# Patient Record
Sex: Female | Born: 1965 | Race: White | Hispanic: No | Marital: Married | Smoking: Never smoker
Health system: Southern US, Community
[De-identification: ages and names within clinical notes are randomized; demographics above are authoritative.]

## PROBLEM LIST (undated history)

## (undated) DIAGNOSIS — H579 Unspecified disorder of eye and adnexa: Secondary | ICD-10-CM

## (undated) DIAGNOSIS — M779 Enthesopathy, unspecified: Secondary | ICD-10-CM

## (undated) DIAGNOSIS — K635 Polyp of colon: Secondary | ICD-10-CM

## (undated) DIAGNOSIS — M75100 Unspecified rotator cuff tear or rupture of unspecified shoulder, not specified as traumatic: Secondary | ICD-10-CM

## (undated) DIAGNOSIS — S42309A Unspecified fracture of shaft of humerus, unspecified arm, initial encounter for closed fracture: Secondary | ICD-10-CM

## (undated) DIAGNOSIS — T8859XA Other complications of anesthesia, initial encounter: Secondary | ICD-10-CM

## (undated) DIAGNOSIS — M722 Plantar fascial fibromatosis: Secondary | ICD-10-CM

## (undated) DIAGNOSIS — F32A Depression, unspecified: Secondary | ICD-10-CM

## (undated) DIAGNOSIS — K317 Polyp of stomach and duodenum: Secondary | ICD-10-CM

## (undated) DIAGNOSIS — K589 Irritable bowel syndrome without diarrhea: Secondary | ICD-10-CM

## (undated) DIAGNOSIS — T7840XA Allergy, unspecified, initial encounter: Secondary | ICD-10-CM

## (undated) DIAGNOSIS — N6019 Diffuse cystic mastopathy of unspecified breast: Secondary | ICD-10-CM

## (undated) DIAGNOSIS — D279 Benign neoplasm of unspecified ovary: Secondary | ICD-10-CM

## (undated) DIAGNOSIS — M1990 Unspecified osteoarthritis, unspecified site: Secondary | ICD-10-CM

## (undated) DIAGNOSIS — F329 Major depressive disorder, single episode, unspecified: Secondary | ICD-10-CM

## (undated) HISTORY — DX: Benign neoplasm of unspecified ovary: D27.9

## (undated) HISTORY — DX: Unspecified fracture of shaft of humerus, unspecified arm, initial encounter for closed fracture: S42.309A

## (undated) HISTORY — DX: Enthesopathy, unspecified: M77.9

## (undated) HISTORY — DX: Unspecified rotator cuff tear or rupture of unspecified shoulder, not specified as traumatic: M75.100

## (undated) HISTORY — DX: Polyp of colon: K63.5

## (undated) HISTORY — PX: WISDOM TOOTH EXTRACTION: SHX21

## (undated) HISTORY — DX: Plantar fascial fibromatosis: M72.2

## (undated) HISTORY — DX: Polyp of stomach and duodenum: K31.7

## (undated) HISTORY — DX: Irritable bowel syndrome, unspecified: K58.9

## (undated) HISTORY — DX: Allergy, unspecified, initial encounter: T78.40XA

## (undated) HISTORY — DX: Depression, unspecified: F32.A

## (undated) HISTORY — DX: Unspecified disorder of eye and adnexa: H57.9

## (undated) HISTORY — DX: Diffuse cystic mastopathy of unspecified breast: N60.19

## (undated) HISTORY — DX: Major depressive disorder, single episode, unspecified: F32.9

## (undated) HISTORY — DX: Unspecified osteoarthritis, unspecified site: M19.90

---

## 1998-02-28 ENCOUNTER — Other Ambulatory Visit: Admission: RE | Admit: 1998-02-28 | Discharge: 1998-02-28 | Payer: Self-pay | Admitting: Obstetrics and Gynecology

## 1999-03-21 ENCOUNTER — Encounter: Admission: RE | Admit: 1999-03-21 | Discharge: 1999-04-14 | Payer: Self-pay | Admitting: Internal Medicine

## 1999-04-08 ENCOUNTER — Other Ambulatory Visit: Admission: RE | Admit: 1999-04-08 | Discharge: 1999-04-08 | Payer: Self-pay | Admitting: Obstetrics and Gynecology

## 1999-07-14 ENCOUNTER — Encounter: Admission: RE | Admit: 1999-07-14 | Discharge: 1999-07-14 | Payer: Self-pay | Admitting: Sports Medicine

## 1999-10-05 ENCOUNTER — Encounter: Payer: Self-pay | Admitting: Neurology

## 1999-10-05 ENCOUNTER — Emergency Department (HOSPITAL_COMMUNITY): Admission: EM | Admit: 1999-10-05 | Discharge: 1999-10-05 | Payer: Self-pay | Admitting: Emergency Medicine

## 1999-10-08 ENCOUNTER — Encounter: Admission: RE | Admit: 1999-10-08 | Discharge: 1999-10-08 | Payer: Self-pay | Admitting: Family Medicine

## 2000-02-23 ENCOUNTER — Encounter: Admission: RE | Admit: 2000-02-23 | Discharge: 2000-02-23 | Payer: Self-pay | Admitting: Sports Medicine

## 2000-04-07 ENCOUNTER — Other Ambulatory Visit: Admission: RE | Admit: 2000-04-07 | Discharge: 2000-04-07 | Payer: Self-pay | Admitting: Obstetrics and Gynecology

## 2001-04-27 ENCOUNTER — Other Ambulatory Visit: Admission: RE | Admit: 2001-04-27 | Discharge: 2001-04-27 | Payer: Self-pay | Admitting: Obstetrics and Gynecology

## 2002-06-16 ENCOUNTER — Other Ambulatory Visit: Admission: RE | Admit: 2002-06-16 | Discharge: 2002-06-16 | Payer: Self-pay | Admitting: Obstetrics and Gynecology

## 2002-11-28 ENCOUNTER — Other Ambulatory Visit: Admission: RE | Admit: 2002-11-28 | Discharge: 2002-11-28 | Payer: Self-pay | Admitting: Obstetrics and Gynecology

## 2002-12-05 ENCOUNTER — Encounter: Admission: RE | Admit: 2002-12-05 | Discharge: 2002-12-05 | Payer: Self-pay | Admitting: Sports Medicine

## 2002-12-25 ENCOUNTER — Encounter: Admission: RE | Admit: 2002-12-25 | Discharge: 2002-12-25 | Payer: Self-pay | Admitting: Obstetrics and Gynecology

## 2003-03-02 ENCOUNTER — Encounter: Admission: RE | Admit: 2003-03-02 | Discharge: 2003-04-02 | Payer: Self-pay | Admitting: Sports Medicine

## 2003-12-31 ENCOUNTER — Other Ambulatory Visit: Admission: RE | Admit: 2003-12-31 | Discharge: 2003-12-31 | Payer: Self-pay | Admitting: Obstetrics and Gynecology

## 2004-01-25 ENCOUNTER — Ambulatory Visit: Payer: Self-pay | Admitting: Internal Medicine

## 2004-06-05 ENCOUNTER — Ambulatory Visit: Payer: Self-pay | Admitting: Internal Medicine

## 2004-12-04 ENCOUNTER — Ambulatory Visit: Payer: Self-pay | Admitting: Internal Medicine

## 2005-02-09 ENCOUNTER — Other Ambulatory Visit: Admission: RE | Admit: 2005-02-09 | Discharge: 2005-02-09 | Payer: Self-pay | Admitting: Obstetrics and Gynecology

## 2005-02-13 ENCOUNTER — Encounter: Admission: RE | Admit: 2005-02-13 | Discharge: 2005-02-13 | Payer: Self-pay | Admitting: Internal Medicine

## 2005-03-10 ENCOUNTER — Ambulatory Visit: Payer: Self-pay | Admitting: Internal Medicine

## 2005-09-29 ENCOUNTER — Ambulatory Visit: Payer: Self-pay | Admitting: Internal Medicine

## 2006-02-17 ENCOUNTER — Encounter: Admission: RE | Admit: 2006-02-17 | Discharge: 2006-02-17 | Payer: Self-pay | Admitting: Internal Medicine

## 2006-03-01 ENCOUNTER — Encounter: Admission: RE | Admit: 2006-03-01 | Discharge: 2006-03-01 | Payer: Self-pay | Admitting: Internal Medicine

## 2006-03-09 ENCOUNTER — Other Ambulatory Visit: Admission: RE | Admit: 2006-03-09 | Discharge: 2006-03-09 | Payer: Self-pay | Admitting: Obstetrics and Gynecology

## 2006-06-02 ENCOUNTER — Ambulatory Visit: Payer: Self-pay | Admitting: Internal Medicine

## 2006-12-13 ENCOUNTER — Ambulatory Visit: Payer: Self-pay | Admitting: Internal Medicine

## 2006-12-13 DIAGNOSIS — J069 Acute upper respiratory infection, unspecified: Secondary | ICD-10-CM | POA: Insufficient documentation

## 2006-12-13 DIAGNOSIS — D369 Benign neoplasm, unspecified site: Secondary | ICD-10-CM | POA: Insufficient documentation

## 2006-12-20 ENCOUNTER — Telehealth (INDEPENDENT_AMBULATORY_CARE_PROVIDER_SITE_OTHER): Payer: Self-pay | Admitting: *Deleted

## 2007-03-15 ENCOUNTER — Encounter: Admission: RE | Admit: 2007-03-15 | Discharge: 2007-03-15 | Payer: Self-pay | Admitting: Obstetrics and Gynecology

## 2007-04-07 ENCOUNTER — Other Ambulatory Visit: Admission: RE | Admit: 2007-04-07 | Discharge: 2007-04-07 | Payer: Self-pay | Admitting: Obstetrics and Gynecology

## 2007-12-20 ENCOUNTER — Ambulatory Visit: Payer: Self-pay | Admitting: Sports Medicine

## 2007-12-20 DIAGNOSIS — S86819A Strain of other muscle(s) and tendon(s) at lower leg level, unspecified leg, initial encounter: Secondary | ICD-10-CM

## 2007-12-20 DIAGNOSIS — M79609 Pain in unspecified limb: Secondary | ICD-10-CM | POA: Insufficient documentation

## 2007-12-20 DIAGNOSIS — S838X9A Sprain of other specified parts of unspecified knee, initial encounter: Secondary | ICD-10-CM | POA: Insufficient documentation

## 2007-12-28 ENCOUNTER — Ambulatory Visit: Payer: Self-pay | Admitting: Sports Medicine

## 2007-12-28 DIAGNOSIS — Q667 Congenital pes cavus, unspecified foot: Secondary | ICD-10-CM | POA: Insufficient documentation

## 2008-02-02 ENCOUNTER — Ambulatory Visit: Payer: Self-pay | Admitting: Sports Medicine

## 2008-03-15 ENCOUNTER — Encounter: Admission: RE | Admit: 2008-03-15 | Discharge: 2008-03-15 | Payer: Self-pay | Admitting: Obstetrics and Gynecology

## 2008-04-10 ENCOUNTER — Ambulatory Visit: Payer: Self-pay | Admitting: Obstetrics and Gynecology

## 2008-04-10 ENCOUNTER — Other Ambulatory Visit: Admission: RE | Admit: 2008-04-10 | Discharge: 2008-04-10 | Payer: Self-pay | Admitting: Obstetrics and Gynecology

## 2008-04-10 ENCOUNTER — Encounter: Payer: Self-pay | Admitting: Obstetrics and Gynecology

## 2008-04-12 ENCOUNTER — Ambulatory Visit: Payer: Self-pay | Admitting: Obstetrics and Gynecology

## 2008-06-08 ENCOUNTER — Ambulatory Visit: Payer: Self-pay | Admitting: Sports Medicine

## 2008-06-08 DIAGNOSIS — M25569 Pain in unspecified knee: Secondary | ICD-10-CM | POA: Insufficient documentation

## 2008-06-21 ENCOUNTER — Ambulatory Visit: Payer: Self-pay | Admitting: Gynecology

## 2008-07-19 ENCOUNTER — Telehealth: Payer: Self-pay | Admitting: Family Medicine

## 2008-07-19 ENCOUNTER — Ambulatory Visit: Payer: Self-pay | Admitting: Family Medicine

## 2008-07-19 ENCOUNTER — Emergency Department (HOSPITAL_COMMUNITY): Admission: EM | Admit: 2008-07-19 | Discharge: 2008-07-20 | Payer: Self-pay | Admitting: Emergency Medicine

## 2008-07-19 DIAGNOSIS — R1013 Epigastric pain: Secondary | ICD-10-CM | POA: Insufficient documentation

## 2008-07-19 LAB — CONVERTED CEMR LAB
Bilirubin Urine: NEGATIVE
Blood in Urine, dipstick: NEGATIVE
Ketones, urine, test strip: NEGATIVE
Nitrite: NEGATIVE
Urobilinogen, UA: 0.2
pH: 7.5

## 2008-07-20 ENCOUNTER — Encounter (INDEPENDENT_AMBULATORY_CARE_PROVIDER_SITE_OTHER): Payer: Self-pay | Admitting: *Deleted

## 2008-07-24 ENCOUNTER — Telehealth: Payer: Self-pay | Admitting: Internal Medicine

## 2008-07-24 ENCOUNTER — Telehealth (INDEPENDENT_AMBULATORY_CARE_PROVIDER_SITE_OTHER): Payer: Self-pay | Admitting: *Deleted

## 2008-07-25 ENCOUNTER — Ambulatory Visit: Payer: Self-pay | Admitting: Internal Medicine

## 2008-07-25 DIAGNOSIS — R1084 Generalized abdominal pain: Secondary | ICD-10-CM | POA: Insufficient documentation

## 2008-07-25 DIAGNOSIS — R198 Other specified symptoms and signs involving the digestive system and abdomen: Secondary | ICD-10-CM | POA: Insufficient documentation

## 2008-08-07 ENCOUNTER — Ambulatory Visit: Payer: Self-pay | Admitting: Internal Medicine

## 2008-08-14 LAB — CONVERTED CEMR LAB: OCCULT 3: NEGATIVE

## 2008-08-20 ENCOUNTER — Ambulatory Visit: Payer: Self-pay | Admitting: Internal Medicine

## 2008-10-18 ENCOUNTER — Ambulatory Visit: Payer: Self-pay | Admitting: Gynecology

## 2008-10-26 ENCOUNTER — Encounter: Admission: RE | Admit: 2008-10-26 | Discharge: 2008-10-26 | Payer: Self-pay | Admitting: Gynecology

## 2009-03-21 ENCOUNTER — Encounter: Admission: RE | Admit: 2009-03-21 | Discharge: 2009-03-21 | Payer: Self-pay | Admitting: Obstetrics and Gynecology

## 2009-04-11 ENCOUNTER — Other Ambulatory Visit: Admission: RE | Admit: 2009-04-11 | Discharge: 2009-04-11 | Payer: Self-pay | Admitting: Obstetrics and Gynecology

## 2009-04-11 ENCOUNTER — Ambulatory Visit: Payer: Self-pay | Admitting: Obstetrics and Gynecology

## 2009-04-15 ENCOUNTER — Ambulatory Visit: Payer: Self-pay | Admitting: Obstetrics and Gynecology

## 2009-07-30 ENCOUNTER — Ambulatory Visit: Payer: Self-pay | Admitting: Obstetrics and Gynecology

## 2009-09-10 ENCOUNTER — Ambulatory Visit: Payer: Self-pay | Admitting: Family Medicine

## 2009-09-10 DIAGNOSIS — IMO0002 Reserved for concepts with insufficient information to code with codable children: Secondary | ICD-10-CM

## 2010-02-09 ENCOUNTER — Encounter: Payer: Self-pay | Admitting: Internal Medicine

## 2010-02-10 ENCOUNTER — Other Ambulatory Visit: Payer: Self-pay | Admitting: Obstetrics and Gynecology

## 2010-02-10 DIAGNOSIS — Z1239 Encounter for other screening for malignant neoplasm of breast: Secondary | ICD-10-CM

## 2010-02-18 NOTE — Assessment & Plan Note (Signed)
Summary: cut on rt thumb/inf/?mrsa/cjr   Vital Signs:  Patient profile:   45 year old female Height:      64 inches (162.56 cm) Weight:      132 pounds (60.00 kg) BMI:     22.74 O2 Sat:      98 % on Room air Temp:     98.2 degrees F (36.78 degrees C) oral Pulse rate:   67 / minute BP sitting:   107 / 70  (left arm) Cuff size:   regular  Vitals Entered By: Josph Macho RMA (September 10, 2009 12:17 PM)  O2 Flow:  Room air CC: Cut on right thumb- possible infection/ CF Is Patient Diabetic? No   History of Present Illness: Patient in today to have a lesion on her right thumb. She had a small scrap on it about 3 weeks ago and did not have any trouble initially. No drainage and maybe some minimal redness and tender. Now as the lesion has healed over on the top it has become increasingly hard and tender and she is afraid an infection is setting in. She denies any fevers/chills/myalgias or worsening fatigue. Her son did have a staph infection requiring antibiotics earlier in the summer.   Current Medications (verified): 1)  Ortho Tri-Cyclen Lo 0.025 Mg Tabs (Norgestimate-Ethinyl Estradiol) .... Take 1 Tablet By Mouth Once A Day 2)  Caltrate 600+d 600-400 Mg-Unit  Tabs (Calcium Carbonate-Vitamin D) .... Two Times A Day 3)  Juice Plus Fibre   Liqd (Nutritional Supplements) .... Two Times A Day  Allergies (verified): 1)  ! Penicillin V Potassium (Penicillin V Potassium) 2)  ! E.e.s. 400 (Erythromycin Ethylsuccinate) 3)  ! Amoxicillin (Amoxicillin)  Past History:  Past medical history reviewed for relevance to current acute and chronic problems. Social history (including risk factors) reviewed for relevance to current acute and chronic problems.  Past Medical History: Reviewed history from 07/25/2008 and no changes required. 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 Eda Paschal for GYN exams Depression Irritable Bowel Syndrome Urinary Tract Infection  Social History: Reviewed history  from 07/25/2008 and no changes required. Married 1 boy 1 girl Never Smoked Regular exercise-yes Occupation: Unemployed Alcohol Use - yes:1-2 on weekends Illicit Drug Use - no Daily Caffeine Use:1-2 daily  Physical Exam  General:  Well-developed,well-nourished,in no acute distress; alert,appropriate and cooperative throughout examination Neck:  No deformities, masses, or tenderness noted. Lungs:  Normal respiratory effort, chest expands symmetrically. Lungs are clear to auscultation, no crackles or wheezes. Heart:  Normal rate and regular rhythm. S1 and S2 normal without gallop, murmur, click, rub or other extra sounds. Abdomen:  Bowel sounds positive,abdomen soft and non-tender without masses, organomegaly or hernias noted. Extremities:  No clubbing, cyanosis, edema, or deformity noted  Psych:  Cognition and judgment appear intact. Alert and cooperative with normal attention span and concentration. No apparent delusions, illusions, hallucinations   Impression & Recommendations:  Problem # 1:  CELLULITIS AND ABSCESS OF UNSPECIFIED DIGIT (ICD-681.9)  Her updated medication list for this problem includes:    Bactrim Ds 800-160 Mg Tabs (Sulfamethoxazole-trimethoprim) .Marland Kitchen... 1 tab by mouth two times a day x 10 days Soak two times a day in H2O2 and warm water and report any concerning symptoms Start a probiotic such as Align daily  Complete Medication List: 1)  Ortho Tri-cyclen Lo 0.025 Mg Tabs (Norgestimate-ethinyl estradiol) .... Take 1 tablet by mouth once a day 2)  Caltrate 600+d 600-400 Mg-unit Tabs (Calcium carbonate-vitamin d) .... Two times a day  3)  Juice Plus Fibre Liqd (Nutritional supplements) .... Two times a day 4)  Bactrim Ds 800-160 Mg Tabs (Sulfamethoxazole-trimethoprim) .Marland Kitchen.. 1 tab by mouth two times a day x 10 days  Patient Instructions: 1)  Please schedule a follow-up appointment as needed .  2)  Take your antibiotic as prescribed until ALL of it is gone, but stop  if you develop a rash or swelling and contact our office as soon as possible.  3)  Soak and then debride the thumb two times a day for the next week 4)  Start a probiotic such as Align daily for at least the next month. 5)  Consider a back up method to the Ortho Tri Cyclen Lo for the next month Prescriptions: BACTRIM DS 800-160 MG TABS (SULFAMETHOXAZOLE-TRIMETHOPRIM) 1 tab by mouth two times a day x 10 days  #20 x 0   Entered and Authorized by:   Danise Edge MD   Signed by:   Danise Edge MD on 09/10/2009   Method used:   Electronically to        [facility]       [address]       [place], [place]  [address]       Ph: [phone]       Fax: [phone]   RxID:   [id number]

## 2010-03-24 ENCOUNTER — Ambulatory Visit
Admission: RE | Admit: 2010-03-24 | Discharge: 2010-03-24 | Disposition: A | Discharge status: Home / Self Care | Payer: BC Managed Care – PPO | Source: Ambulatory Visit | Attending: Obstetrics and Gynecology | Admitting: Obstetrics and Gynecology

## 2010-03-24 DIAGNOSIS — Z1239 Encounter for other screening for malignant neoplasm of breast: Secondary | ICD-10-CM

## 2010-04-17 ENCOUNTER — Other Ambulatory Visit: Payer: Self-pay | Admitting: Obstetrics and Gynecology

## 2010-04-17 ENCOUNTER — Encounter (INDEPENDENT_AMBULATORY_CARE_PROVIDER_SITE_OTHER): Payer: BC Managed Care – PPO | Admitting: Obstetrics and Gynecology

## 2010-04-17 ENCOUNTER — Other Ambulatory Visit (HOSPITAL_COMMUNITY)
Admission: RE | Admit: 2010-04-17 | Discharge: 2010-04-17 | Disposition: A | Discharge status: Home / Self Care | Payer: BC Managed Care – PPO | Source: Ambulatory Visit | Attending: Obstetrics and Gynecology | Admitting: Obstetrics and Gynecology

## 2010-04-17 DIAGNOSIS — R82998 Other abnormal findings in urine: Secondary | ICD-10-CM

## 2010-04-17 DIAGNOSIS — Z13220 Encounter for screening for lipoid disorders: Secondary | ICD-10-CM

## 2010-04-17 DIAGNOSIS — Z01419 Encounter for gynecological examination (general) (routine) without abnormal findings: Secondary | ICD-10-CM

## 2010-04-17 DIAGNOSIS — Z124 Encounter for screening for malignant neoplasm of cervix: Secondary | ICD-10-CM | POA: Insufficient documentation

## 2010-04-21 ENCOUNTER — Other Ambulatory Visit: Payer: BC Managed Care – PPO

## 2010-04-21 ENCOUNTER — Ambulatory Visit (INDEPENDENT_AMBULATORY_CARE_PROVIDER_SITE_OTHER): Payer: BC Managed Care – PPO | Admitting: Obstetrics and Gynecology

## 2010-04-21 ENCOUNTER — Other Ambulatory Visit: Payer: Self-pay | Admitting: Obstetrics and Gynecology

## 2010-04-21 DIAGNOSIS — N921 Excessive and frequent menstruation with irregular cycle: Secondary | ICD-10-CM

## 2010-04-21 DIAGNOSIS — N920 Excessive and frequent menstruation with regular cycle: Secondary | ICD-10-CM

## 2010-04-27 LAB — HEPATIC FUNCTION PANEL
ALT: 13 U/L (ref 0–35)
Alkaline Phosphatase: 42 U/L (ref 39–117)
Bilirubin, Direct: <0.1 mg/dL (ref 0.0–0.3)

## 2010-04-27 LAB — URINALYSIS, ROUTINE W REFLEX MICROSCOPIC
Bilirubin Urine: NEGATIVE
Ketones, ur: NEGATIVE mg/dL
Nitrite: NEGATIVE
Protein, ur: NEGATIVE mg/dL
Specific Gravity, Urine: 1.023 (ref 1.005–1.030)
Urobilinogen, UA: 0.2 mg/dL (ref 0.0–1.0)

## 2010-04-27 LAB — CBC
HCT: 42.3 % (ref 36.0–46.0)
MCHC: 35.4 g/dL (ref 30.0–36.0)
Platelets: 233 10*3/uL (ref 150–400)
RDW: 12.5 % (ref 11.5–15.5)
WBC: 6.7 10*3/uL (ref 4.0–10.5)

## 2010-04-27 LAB — DIFFERENTIAL
Eosinophils Absolute: 0.1 10*3/uL (ref 0.0–0.7)
Monocytes Absolute: 0.6 10*3/uL (ref 0.1–1.0)
Neutro Abs: 3.8 10*3/uL (ref 1.7–7.7)

## 2010-04-27 LAB — BASIC METABOLIC PANEL
Calcium: 9 mg/dL (ref 8.4–10.5)
Creatinine, Ser: 0.94 mg/dL (ref 0.4–1.2)
GFR calc Af Amer: >60 mL/min (ref >60)
Glucose, Bld: 114 mg/dL — ABNORMAL HIGH (ref 70–99)
Potassium: 3.6 mEq/L (ref 3.5–5.1)
Sodium: 137 mEq/L (ref 135–145)

## 2010-04-27 LAB — URINE MICROSCOPIC-ADD ON

## 2010-05-09 ENCOUNTER — Encounter: Payer: Self-pay | Admitting: Internal Medicine

## 2010-05-12 ENCOUNTER — Ambulatory Visit (INDEPENDENT_AMBULATORY_CARE_PROVIDER_SITE_OTHER): Payer: BC Managed Care – PPO | Admitting: Internal Medicine

## 2010-05-12 ENCOUNTER — Encounter: Payer: Self-pay | Admitting: Internal Medicine

## 2010-05-12 VITALS — BP 110/72 | HR 80 | Temp 97.9°F | Ht 63.5 in | Wt 133.0 lb

## 2010-05-12 DIAGNOSIS — I809 Phlebitis and thrombophlebitis of unspecified site: Secondary | ICD-10-CM

## 2010-05-12 NOTE — Progress Notes (Signed)
  Subjective:    Patient ID: Kimberly Stevens, female    DOB: 09/11/1965, 45 y.o.   MRN: [id number]  HPI  She remembers hitting countertop several weeks ago. Had no immediate sxs She now has 4 weeks of left sided abdominal pain that she notes when she turns in certain positions. She feels something a bit unusual (palpating). She describes a vertical, linear break extending across the left side of her abdomen. Somewhat tender to palpation. Duration at least 4 weeks.  Past Medical History  Diagnosis Date  . Dermoid cyst of ovary   . IBS (irritable bowel syndrome)   . Depression    No past surgical history on file.  reports that she has never smoked. She does not have any smokeless tobacco history on file. She reports that she drinks alcohol. She reports that she does not use illicit drugs. family history is negative for Cancer. Allergies  Allergen Reactions  . Amoxicillin     REACTION: tremors  . Erythromycin Ethylsuccinate     REACTION: tremors  . Penicillins     REACTION: tremors    Review of Systems      patient denies chest pain, shortness of breath, orthopnea. Denies lower extremity edema, abdominal pain, change in appetite, change in bowel movements. Patient denies rashes, musculoskeletal complaints. No other specific complaints in a complete review of systems.  Objective:   Physical Exam Very pleasant female in no acute distress. She appears fit. HEENT exam atraumatic, normocephalic, neck supple. Chest clear to auscultation cardiac exam S1-S2 are regular. Abdominal exam active bowel sounds, soft, nontender. No masses. On the left side of the abdomen is palpated there is a very superficial cord along the left side of her abdomen running vertically. Mildly tender to palpation. When the skin is stretched over the abnormality there is a linear streaking to the area.       Assessment & Plan:  Superficial phlebitis of the abdominal wall. I suspect she had a minor trauma to the  area when she hit the kitchen counter. I think she has a resulting phlebitis. I've asked her to take an aspirin daily for the next 6 weeks. This should self resolve. If his symptoms worsen or progress she is to call me and we will evaluate further.

## 2010-05-14 MED ORDER — ASPIRIN EC 325 MG PO TBEC: 325.0000 mg | DELAYED_RELEASE_TABLET | Freq: Every day | ORAL | Status: AC

## 2010-06-06 NOTE — Consult Note (Signed)
[facility]  Patient:    Kimberly Stevens, Kimberly Stevens                      MRN: [id number] Proc. Date: 10/05/99 Adm. Date:  [id number] Attending:  Lorre Nick CC:         Dr. Robert Bellow  [facility]   Consultation Report  HISTORY OF PRESENT ILLNESS:  Kimberly Stevens is a 45 year old right-handed white female born [birth year], with a history of gestational diabetes.  This patient comes into the emergency room today for an evaluation of a severe headache that began yesterday around 4 p.m.  The patient also notes chills, low-grade fever, and had a severe headache today associated with some nausea, no vomiting.  The patient went to urgent medical care for an evaluation and was found to have a urinary tract infection but also complained of some back stiffness and headache.  This patient was sent to the emergency room for an evaluation.  At this point, neurology was called for evaluation.  CT scan of the brain was done and was unremarkable without contrast.  No evidence of sinusitis was seen.  The patient does note some pain with turning her eyes from side to side.  The patient does note some neck stiffness.  Denies any confusion, blackout episodes.  Does note some slight dizziness.  The patient has had some vaginal discharge today.  PAST MEDICAL HISTORY: 1. History of gestational diabetes. 2. History of headache with urinary tract infection.  MEDICATIONS:  The patient is on no medications.  SOCIAL HISTORY:  Does not smoke.  Drinks alcohol on occasion.  The patient is married and has two children.  Works a few hours in the home environment.  The patient otherwise is not employed.  ALLERGIES:  States allergy to AMOXICILLIN and ERYTHROMYCIN.  FAMILY HISTORY:  Both parents are alive and well.  The patient has two sisters, who are alive and well.  Maternal grandmother has a history of MI. Maternal grandfather has a history of stroke.   No family history of diabetes is noted.  REVIEW OF SYSTEMS:  Notable for some probable low-grade fevers.  The patient denies any problems with vision, loss of vision, double vision.  Denies confusion, shortness of breath, chest pain, abdominal pain.  The patient denies any diarrhea, cough, sinus drainage.  Denies any problem controlling the bowels or the bladder.  PHYSICAL EXAMINATION:  VITAL SIGNS:  Blood pressure is 90/60, heart rate 80, respiratory rate 12, temperature is 99.  GENERAL:  This patient is a well-developed white female who is alert and cooperative at the time of the examination.  HEENT:  Head is atraumatic.  Eyes:  Pupils are equal, round and reactive to light.  Discs are flat bilaterally.  Good venous pulsations are seen bilaterally.  NECK:  Slightly stiff with flexion.  RESPIRATORY:  Clear.  CARDIOVASCULAR:  Regular rate and rhythm without obvious murmurs or rubs.  EXTREMITIES:  No evidence of edema in the extremities is noted.  NEUROLOGIC:  Notable for good, symmetric facies.  The patient has good pinprick and soft touch sensation bilaterally.  The patient has good strength in the facial muscles and the muscles of head turning and shoulder shrug bilaterally.  The patient has full visual fields to double simultaneous stimulation.  Speech is well-enunciated.  Motor testing reveals 5/5 strength in all fours.  Good symmetric motor tone is noted throughout.  Sensory testing is intact to pinprick and  soft touch and vibratory sensation throughout. Finger-nose-finger and toe-to-finger symmetric.  Deep tendon reflexes are symmetric and normal.  Toes are downgoing bilaterally.  The patient was not ambulated.  LABORATORY DATA:  CT scan of the brain was unremarkable.  Blood work done earlier reveals a white count of 7.5, hemoglobin 13.0, hematocrit 43.5, MCV of 98.7, platelets of 256.  Urinalysis reveals 15-16 white cells, specific gravity of 1.013, pH  8.0.  IMPRESSION: 1. Probable toxic headache versus very low-grade aseptic meningitis. 2. Urinary tract infection.  This patient has evidence of white cells in the urine, has had vaginal discharge.  Examination today does show some mild neck stiffness, but discs are flat and the patient has excellent venous pulsations in the discs, suggesting that the intracranial pressure is normal.  I think the likelihood of aseptic meningitis, therefore, is not high.  Patient claims she feels much better even over the last several hours after having gone home and eaten.  At this point, will treat the urinary tract infection and use analgesics for pain.  PLAN: 1. Will defer lumbar puncture at this point. 2. Cipro 250 mg p.o. b.i.d. for seven days. 3. Vicodin if needed for pain. 4. The patient will follow up through urgent medical care if needed.  May    contact our office if headache worsens or confusion ensues.  May proceed    with lumbar puncture if the clinical syndrome deteriorates.DD:  10/05/99 TD:  10/06/99 Job: [id number]

## 2010-07-10 ENCOUNTER — Ambulatory Visit (INDEPENDENT_AMBULATORY_CARE_PROVIDER_SITE_OTHER): Payer: BC Managed Care – PPO | Admitting: Internal Medicine

## 2010-07-10 ENCOUNTER — Encounter: Payer: Self-pay | Admitting: Internal Medicine

## 2010-07-10 VITALS — BP 100/70 | Temp 98.4°F | Wt 136.0 lb

## 2010-07-10 DIAGNOSIS — J029 Acute pharyngitis, unspecified: Secondary | ICD-10-CM

## 2010-07-10 DIAGNOSIS — J069 Acute upper respiratory infection, unspecified: Secondary | ICD-10-CM

## 2010-07-10 NOTE — Progress Notes (Signed)
  Subjective:    Patient ID: Kimberly Stevens, female    DOB: 12-06-1965, 45 y.o.   MRN: [id number]  HPI  45 year old patient who presents with a several day history of worsening sore throat. Associated symptoms include nasal congestion and some postnasal drip. There's been no strep exposure. Denies any fever. She does have some mild associated hoarseness;  she was concerned about the possibility of a strep pharyngitis;  she will be leaving on vacation in the next few days    Review of Systems  Constitutional: Negative.   HENT: Positive for congestion and postnasal drip. Negative for hearing loss, sore throat, rhinorrhea, dental problem, sinus pressure and tinnitus.   Eyes: Negative for pain, discharge and visual disturbance.  Respiratory: Negative for cough and shortness of breath.   Cardiovascular: Negative for chest pain, palpitations and leg swelling.  Gastrointestinal: Negative for nausea, vomiting, abdominal pain, diarrhea, constipation, blood in stool and abdominal distention.  Genitourinary: Negative for dysuria, urgency, frequency, hematuria, flank pain, vaginal bleeding, vaginal discharge, difficulty urinating, vaginal pain and pelvic pain.  Musculoskeletal: Negative for joint swelling, arthralgias and gait problem.  Skin: Negative for rash.  Neurological: Negative for dizziness, syncope, speech difficulty, weakness, numbness and headaches.  Hematological: Negative for adenopathy.  Psychiatric/Behavioral: Negative for behavioral problems, dysphoric mood and agitation. The patient is not nervous/anxious.        Objective:   Physical Exam  Constitutional: She is oriented to person, place, and time. She appears well-developed and well-nourished.  HENT:  Head: Normocephalic.  Right Ear: External ear normal.  Left Ear: External ear normal.       Oropharynx erythematous. No adenopathy  Eyes: Conjunctivae and EOM are normal. Pupils are equal, round, and reactive to light.  Neck:  Normal range of motion. Neck supple. No thyromegaly present.  Cardiovascular: Normal rate, regular rhythm, normal heart sounds and intact distal pulses.   Pulmonary/Chest: Effort normal and breath sounds normal.  Abdominal: Soft. Bowel sounds are normal. She exhibits no mass. There is no tenderness.  Musculoskeletal: Normal range of motion.  Lymphadenopathy:    She has no cervical adenopathy.  Neurological: She is alert and oriented to person, place, and time.  Skin: Skin is warm and dry. No rash noted.  Psychiatric: She has a normal mood and affect. Her behavior is normal.          Assessment & Plan:   URI with negative rapid strep. We'll treat symptomatically

## 2010-07-10 NOTE — Patient Instructions (Signed)
Get plenty of rest, Drink lots of  clear liquids, and use Tylenol or ibuprofen for fever and discomfort.    Call or return to clinic prn if these symptoms worsen or fail to improve as anticipated.  

## 2010-09-05 ENCOUNTER — Other Ambulatory Visit: Payer: Self-pay | Admitting: Obstetrics and Gynecology

## 2010-09-05 NOTE — Telephone Encounter (Signed)
I LEFT A MESSAGE ON PTS VOICEMAIL AT 12:27P.M. THAT DR. GOTTSEGEN NOT IN OFFICE UNTIL 09/10/10 & WAS SHE HAVING CURRENT SYMPTOMS? & IF SO LEAVE A DETAILED MESSAGE WITH THIS INFO.

## 2010-11-06 ENCOUNTER — Encounter: Payer: Self-pay | Admitting: Family Medicine

## 2010-11-06 ENCOUNTER — Ambulatory Visit (INDEPENDENT_AMBULATORY_CARE_PROVIDER_SITE_OTHER): Payer: BC Managed Care – PPO | Admitting: Family Medicine

## 2010-11-06 VITALS — BP 110/80 | Temp 98.1°F | Wt 136.0 lb

## 2010-11-06 DIAGNOSIS — R05 Cough: Secondary | ICD-10-CM

## 2010-11-06 MED ORDER — BENZONATATE 200 MG PO CAPS: 200.0000 mg | ORAL_CAPSULE | Freq: Three times a day (TID) | ORAL | Status: AC | PRN

## 2010-11-06 MED ORDER — AZITHROMYCIN 250 MG PO TABS: ORAL_TABLET | ORAL | Status: AC

## 2010-11-06 NOTE — Progress Notes (Signed)
  Subjective:    Patient ID: Kimberly Stevens, female    DOB: 12-Oct-1965, 45 y.o.   MRN: [id number]  HPI Cough for 3 weeks. Nonsmoker. No history of asthma. Increased fatigue. Still exercising some. Cough mostly dry but over the past 2 days productive of yellow sputum. Sudafed without improvement. She denies any fevers or chills. No dyspnea. No pleuritic pain. No history of GERD. No obvious postnasal drip or active allergy symptoms.  No obvious wheezing.   Review of Systems  Constitutional: Negative for chills and unexpected weight change.  HENT: Negative for sore throat, trouble swallowing, voice change and postnasal drip.   Respiratory: Positive for cough. Negative for shortness of breath and wheezing.   Neurological: Negative for headaches.       Objective:   Physical Exam  Constitutional: She appears well-developed and well-nourished. No distress.  HENT:  Right Ear: External ear normal.  Left Ear: External ear normal.  Mouth/Throat: Oropharynx is clear and moist.  Neck: Neck supple. No thyromegaly present.  Cardiovascular: Normal rate and regular rhythm.   Pulmonary/Chest: Effort normal and breath sounds normal. No respiratory distress. She has no wheezes. She has no rales.  Lymphadenopathy:    She has no cervical adenopathy.          Assessment & Plan:  Cough. Differential includes viral bronchitis versus atypical infection. She has developed productive cough past few days but nonfocal exam. Given duration, start Zithromax. Tessalon Perles 200 mg every 8 hours as needed for cough. Follow up promptly for fever or if cough still persists over the next week or 2.

## 2010-11-17 ENCOUNTER — Encounter: Payer: Self-pay | Admitting: Family Medicine

## 2010-11-17 ENCOUNTER — Ambulatory Visit (INDEPENDENT_AMBULATORY_CARE_PROVIDER_SITE_OTHER): Payer: BC Managed Care – PPO | Admitting: Family Medicine

## 2010-11-17 VITALS — BP 110/68 | Temp 98.4°F | Wt 135.0 lb

## 2010-11-17 DIAGNOSIS — R05 Cough: Secondary | ICD-10-CM

## 2010-11-17 NOTE — Progress Notes (Signed)
  Subjective:    Patient ID: Kimberly Stevens, female    DOB: 1965/07/11, 45 y.o.   MRN: [id number]  HPI  Persistent cough. Overall slightly improved. Patient concerned because she had a friend who recently died of lung cancer. Her cough is mostly nonproductive. She did have some recent productive cough which improved after Zithromax. No wheezing. Possibly some minimal postnasal drip. No obvious GERD symptoms. No fevers or chills. No appetite or weight changes. Nonsmoker. Tessalon did help with cough but she only took this for about 2 days.  She is still exercising most days of the week   Review of Systems  Constitutional: Negative for fever, chills, fatigue and unexpected weight change.  Respiratory: Positive for cough. Negative for wheezing and stridor.   Cardiovascular: Negative for chest pain, palpitations and leg swelling.  Neurological: Negative for headaches.       Objective:   Physical Exam  Constitutional: She appears well-developed and well-nourished. No distress.  HENT:  Right Ear: External ear normal.  Left Ear: External ear normal.  Mouth/Throat: Oropharynx is clear and moist.  Neck: Neck supple.  Cardiovascular: Normal rate and regular rhythm.   Pulmonary/Chest: Effort normal and breath sounds normal. No respiratory distress. She has no wheezes. She has no rales.  Lymphadenopathy:    She has no cervical adenopathy.          Assessment & Plan:  Persistent cough. Given duration chest x-ray. She has nonfocal exam and still suspect this is post viral or possibly postnasal drip related. She'll try over-the-counter antihistamine.

## 2010-11-18 ENCOUNTER — Ambulatory Visit (INDEPENDENT_AMBULATORY_CARE_PROVIDER_SITE_OTHER)
Admission: RE | Admit: 2010-11-18 | Discharge: 2010-11-18 | Disposition: A | Discharge status: Home / Self Care | Payer: BC Managed Care – PPO | Source: Ambulatory Visit | Attending: Family Medicine | Admitting: Family Medicine

## 2010-11-18 DIAGNOSIS — R05 Cough: Secondary | ICD-10-CM

## 2010-11-18 NOTE — Progress Notes (Signed)
Quick Note:  Pt informed on personally identified VM ______ 

## 2010-12-26 ENCOUNTER — Ambulatory Visit (INDEPENDENT_AMBULATORY_CARE_PROVIDER_SITE_OTHER): Payer: BC Managed Care – PPO | Admitting: Family Medicine

## 2010-12-26 ENCOUNTER — Telehealth: Payer: Self-pay | Admitting: Internal Medicine

## 2010-12-26 ENCOUNTER — Encounter: Payer: Self-pay | Admitting: Family Medicine

## 2010-12-26 VITALS — BP 100/60 | Temp 98.0°F | Wt 135.0 lb

## 2010-12-26 DIAGNOSIS — R5381 Other malaise: Secondary | ICD-10-CM

## 2010-12-26 DIAGNOSIS — R5383 Other fatigue: Secondary | ICD-10-CM

## 2010-12-26 DIAGNOSIS — J329 Chronic sinusitis, unspecified: Secondary | ICD-10-CM

## 2010-12-26 DIAGNOSIS — R059 Cough, unspecified: Secondary | ICD-10-CM

## 2010-12-26 DIAGNOSIS — R05 Cough: Secondary | ICD-10-CM

## 2010-12-26 DIAGNOSIS — J3489 Other specified disorders of nose and nasal sinuses: Secondary | ICD-10-CM

## 2010-12-26 LAB — CBC WITH DIFFERENTIAL/PLATELET
HCT: 42.8 % (ref 36.0–46.0)
Hemoglobin: 14.5 g/dL (ref 12.0–15.0)
Lymphocytes Relative: 30 % (ref 12–46)
Lymphs Abs: 2.2 10*3/uL (ref 0.7–4.0)
MCHC: 33.9 g/dL (ref 30.0–36.0)
Monocytes Absolute: 0.7 10*3/uL (ref 0.1–1.0)
Monocytes Relative: 9 % (ref 3–12)
Neutro Abs: 4.5 10*3/uL (ref 1.7–7.7)
RBC: 4.4 MIL/uL (ref 3.87–5.11)
WBC: 7.6 10*3/uL (ref 4.0–10.5)

## 2010-12-26 MED ORDER — CEFDINIR 300 MG PO CAPS: 300.0000 mg | ORAL_CAPSULE | Freq: Two times a day (BID) | ORAL | Status: AC

## 2010-12-26 NOTE — Progress Notes (Signed)
  Subjective:    Patient ID: Kimberly Stevens, female    DOB: 1965-09-28, 45 y.o.   MRN: [id number]  HPI  Patient seen with rhinorrhea over the past 2 weeks. She had some persistent cough which is finally better. Sinus congestive symptoms are new. She has occasional nausea but no vomiting. She does have some yellowish nasal discharge past several days and some intermittent facial pressure and occasional headaches. She had extreme fatigue. Intermittent sore throat. Specifically requesting mono screen though she's not had any exposures, lymphadenopathy, or fever. Occasional bilateral ear pressure.    Review of Systems  Constitutional: Positive for fatigue. Negative for fever and chills.  HENT: Positive for congestion and sinus pressure. Negative for sore throat and neck stiffness.   Respiratory: Negative for cough.   Skin: Negative for rash.  Neurological: Positive for headaches.  Hematological: Negative for adenopathy.       Objective:   Physical Exam  Constitutional: She appears well-developed and well-nourished. No distress.  HENT:  Right Ear: External ear normal.  Left Ear: External ear normal.  Mouth/Throat: Oropharynx is clear and moist.  Neck: Neck supple.  Cardiovascular: Normal rate and regular rhythm.   Pulmonary/Chest: Effort normal and breath sounds normal. No respiratory distress. She has no wheezes. She has no rales.  Lymphadenopathy:    She has no cervical adenopathy.          Assessment & Plan:  Probable acute sinusitis. Omnicef 300 mg one twice a day. Patient requesting CBC and mono screen but doubt mononucleosis clinically.

## 2010-12-26 NOTE — Telephone Encounter (Signed)
Pt called and said that she has been having sob and feels like her heart is hurting for the last 3 days. Pt is physically active and know she's not having a heart attack, but something does not feel right.

## 2010-12-26 NOTE — Telephone Encounter (Signed)
Worked in with Dr. Caryl Never

## 2010-12-27 LAB — MONONUCLEOSIS SCREEN: Mono Screen: NEGATIVE

## 2010-12-30 NOTE — Progress Notes (Signed)
Quick Note:  Pt informed ______ 

## 2011-01-07 ENCOUNTER — Encounter: Payer: Self-pay | Admitting: Family Medicine

## 2011-01-07 ENCOUNTER — Ambulatory Visit (INDEPENDENT_AMBULATORY_CARE_PROVIDER_SITE_OTHER): Payer: BC Managed Care – PPO | Admitting: Family Medicine

## 2011-01-07 VITALS — BP 98/60 | HR 89 | Temp 97.8°F | Wt 132.0 lb

## 2011-01-07 DIAGNOSIS — J019 Acute sinusitis, unspecified: Secondary | ICD-10-CM

## 2011-01-07 MED ORDER — DOXYCYCLINE HYCLATE 100 MG PO TABS: 100.0000 mg | ORAL_TABLET | Freq: Two times a day (BID) | ORAL | Status: AC

## 2011-01-07 NOTE — Progress Notes (Signed)
  Subjective:    Patient ID: Kimberly Stevens, female    DOB: 1965/05/12, 45 y.o.   MRN: [id number]  HPI 45 year old white female, nonsmoker, who in with recurrent sinus pressure, congestion, thick yellow drainage from the nose and one on for 2 days now. She saw Dr. Caryl Never about 3 weeks ago who prescribed her Omnicef. She admits to missing a few doses of the medication. She has also been taking over-the-counter nasal decongestant spray. Has had sick contacts her daughter is ill as well. She denies any lightheadedness or dizziness, chest pain, shortness of breath, nausea, vomiting, or edema.    Review of Systems As stated above Past Medical History  Diagnosis Date  . Dermoid cyst of ovary   . IBS (irritable bowel syndrome)   . Depression     History   Social History  . Marital Status: Married    Spouse Name: N/A    Number of Children: N/A  . Years of Education: N/A   Occupational History  . Not on file.   Social History Main Topics  . Smoking status: Never Smoker   . Smokeless tobacco: Not on file  . Alcohol Use: Yes  . Drug Use: No  . Sexually Active:    Other Topics Concern  . Not on file   Social History Narrative  . No narrative on file    No past surgical history on file.  Family History  Problem Relation Age of Onset  . Cancer Neg Hx     colon    Allergies  Allergen Reactions  . Amoxicillin     REACTION: tremors  . Erythromycin Ethylsuccinate     REACTION: tremors  . Penicillins     REACTION: tremors    Current Outpatient Prescriptions on File Prior to Visit  Medication Sig Dispense Refill  . Multiple Vitamins-Minerals (MULTIVITAMIN WITH MINERALS) tablet Take 1 tablet by mouth daily.        . Nutritional Supplements (JUICE PLUS FIBRE) LIQD Take by mouth 2 (two) times daily.          BP 98/60  Pulse 89  Temp(Src) 97.8 F (36.6 C) (Oral)  Wt 132 lb (59.875 kg)chart   ENT: Objective:   Physical Exam Constitutional: Alert and oriented in no  acute distress ENT: Ears bilaterally are clear sinus tenderness to palpation of the maxillary and frontal sinuses. Pharynx normal with normal dentition. No lymphadenopathy. Lungs: Clear to auscultation Cardiac: Regular rate and rhythm Skin: Warm and dry, no cyanosis       Assessment & Plan:  Assessment: Acute sinusitis-unresolved  Plan: Doxycycline 100 mg twice a day for 10 days. Encouraged over-the-counter antihistamine decongestant as directed. Patient to call if symptoms worsen or persist recheck as scheduled and when necessary

## 2011-01-07 NOTE — Patient Instructions (Signed)

## 2011-01-26 ENCOUNTER — Encounter: Payer: Self-pay | Admitting: Gynecology

## 2011-02-02 ENCOUNTER — Ambulatory Visit (INDEPENDENT_AMBULATORY_CARE_PROVIDER_SITE_OTHER): Payer: BC Managed Care – PPO | Admitting: Obstetrics and Gynecology

## 2011-02-02 DIAGNOSIS — N938 Other specified abnormal uterine and vaginal bleeding: Secondary | ICD-10-CM

## 2011-02-02 DIAGNOSIS — N949 Unspecified condition associated with female genital organs and menstrual cycle: Secondary | ICD-10-CM

## 2011-02-02 NOTE — Progress Notes (Signed)
Patient came back today because of persistent cycle problems. She can bleed as long as 14 days. She sometimes will have menorrhagia. She's had a normal SIH with endometrial biopsy last year in our office. It has become intolerable.  Pelvic exam:Kim Gardner present. Pelvic exam: External: Within normal limits. BUS within normal limits. Vaginal exam: Within normal limits. Cervix: Clean. Uterus: Within normal limits. Adnexa: Within normal limits. Rectovaginal exam: Within normal limits.   Assessment: Prolonged cycles.  Plan: We checked her TSH. We had a long discussion about Mirena IUD or endometrial ablation. Pamphlets given. Discussed vaginal hysterectomy as a last resort. Patient will inform.

## 2011-02-27 ENCOUNTER — Encounter (HOSPITAL_COMMUNITY): Payer: Self-pay | Admitting: *Deleted

## 2011-02-27 ENCOUNTER — Emergency Department (HOSPITAL_COMMUNITY)
Admission: EM | Admit: 2011-02-27 | Discharge: 2011-02-27 | Disposition: A | Discharge status: Home / Self Care | Payer: BC Managed Care – PPO | Source: Home / Self Care

## 2011-02-27 ENCOUNTER — Other Ambulatory Visit: Payer: Self-pay

## 2011-02-27 DIAGNOSIS — R0789 Other chest pain: Secondary | ICD-10-CM

## 2011-02-27 DIAGNOSIS — K3189 Other diseases of stomach and duodenum: Secondary | ICD-10-CM

## 2011-02-27 DIAGNOSIS — J020 Streptococcal pharyngitis: Secondary | ICD-10-CM

## 2011-02-27 DIAGNOSIS — K30 Functional dyspepsia: Secondary | ICD-10-CM

## 2011-02-27 LAB — POCT RAPID STREP A: Streptococcus, Group A Screen (Direct): POSITIVE — AB

## 2011-02-27 MED ORDER — OMEPRAZOLE 20 MG PO CPDR: 20.0000 mg | DELAYED_RELEASE_CAPSULE | Freq: Every day | ORAL | Status: DC

## 2011-02-27 MED ORDER — CEPHALEXIN 500 MG PO CAPS: 500.0000 mg | ORAL_CAPSULE | Freq: Three times a day (TID) | ORAL | Status: AC

## 2011-02-27 NOTE — ED Provider Notes (Signed)
History     CSN: [id number]  Arrival date & time 02/27/11  1616   None     Chief Complaint  Patient presents with  . Sore Throat    (Consider location/radiation/quality/duration/timing/severity/associated sxs/prior treatment) HPI Comments: Patient states she developed a sore throat yesterday. Her daughter recently was diagnosed with strep throat. She has had aching intermittent chest discomfort since fall 2012. She was seen by her PCP regarding this and had a chest xray done. At the time she had some sinus congestion and was treated with abx. She states the chest discomfort is not associated with exercise. She competes in triathelons, and works out regularly swimming, biking and running.  The pain does not radiate. She admits to intermittent indigestion, which she takes otc antacids for. She is concerned that the discomfort she has is due to her heart. "I know it may be anxiety." "We are in the middle of building a house."   Past Medical History  Diagnosis Date  . Dermoid cyst of ovary   . IBS (irritable bowel syndrome)   . Depression     History reviewed. No pertinent past surgical history.  Family History  Problem Relation Age of Onset  . Cancer Neg Hx     colon    History  Substance Use Topics  . Smoking status: Never Smoker   . Smokeless tobacco: Not on file  . Alcohol Use: Yes    OB History    Grav Para Term Preterm Abortions TAB SAB Ect Mult Living   2 2 2       2       Review of Systems  Constitutional: Positive for fatigue. Negative for fever and chills.  HENT: Positive for sore throat. Negative for ear pain, rhinorrhea, postnasal drip and sinus pressure.   Respiratory: Positive for chest tightness. Negative for cough, shortness of breath and wheezing.   Cardiovascular: Negative for chest pain and palpitations.  Gastrointestinal: Negative for nausea, vomiting and abdominal pain.    Allergies  Amoxicillin; Erythromycin ethylsuccinate; and  Penicillins  Home Medications   Current Outpatient Rx  Name Route Sig Dispense Refill  . CALCIUM + D PO Oral Take by mouth.      . CEPHALEXIN 500 MG PO CAPS Oral Take 1 capsule (500 mg total) by mouth 3 (three) times daily. 30 capsule 0  . MULTI-VITAMIN/MINERALS PO TABS Oral Take 1 tablet by mouth daily.      Marland Kitchen JUICE PLUS FIBRE PO LIQD Oral Take by mouth 2 (two) times daily.      Marland Kitchen OMEPRAZOLE 20 MG PO CPDR Oral Take 1 capsule (20 mg total) by mouth daily. 15 capsule 0    BP 136/86  Pulse 74  Temp(Src) 99.1 F (37.3 C) (Oral)  Resp 12  SpO2 100%  LMP 02/27/2011  Physical Exam  Nursing note and vitals reviewed. Constitutional: She appears well-developed and well-nourished. No distress.  HENT:  Head: Normocephalic and atraumatic.  Right Ear: Tympanic membrane, external ear and ear canal normal.  Left Ear: Tympanic membrane, external ear and ear canal normal.  Nose: Nose normal.  Mouth/Throat: Uvula is midline and mucous membranes are normal. Posterior oropharyngeal erythema present. No oropharyngeal exudate or posterior oropharyngeal edema.  Neck: Neck supple.  Cardiovascular: Normal rate, regular rhythm and normal heart sounds.   Pulmonary/Chest: Effort normal and breath sounds normal. No respiratory distress. She exhibits tenderness (mild TTP Lt sternal border. ).  Lymphadenopathy:    She has no cervical adenopathy.  Neurological: She  is alert.  Skin: Skin is warm and dry.  Psychiatric: She has a normal mood and affect.    ED Course  Procedures (including critical care time)  Labs Reviewed  POCT RAPID STREP A ([facility] CARE ONLY) - Abnormal; Notable for the following:    Streptococcus, Group A Screen (Direct) POSITIVE (*)    All other components within normal limits   No results found.   1. Strep pharyngitis   2. Atypical chest pain   3. Indigestion       MDM  11-17-10 CXR neg. EKG NSR, rate 62. Intermittent atypical chest pain since fall 2012. Pt has mild  chest wall pain Lt sternal border. Also has intermittent reflux symptoms. Discussed with pt will do 2 wk trial of PPI. To f/u with PCP.          Melody Comas, [place] 02/27/11 [phone]

## 2011-02-27 NOTE — ED Notes (Signed)
Pt    C/o  sorethroat      Chest  Congestion     Sinus  Drainage  As  Well  As     Chest  Pains  r  Side      -SHE  REPORTS  SHE  HAS  HAD  LINGERING  SYMPTOMS   SINCE   eND  OF  July          SHE REPORTS   FATIGUE   AS  WELL  AS   SENSATION OF  BUILD  UP  OF  PHLEGM        -  SHE  IS  ALERT  ORIENTED  APPEARS  IN NO  SEVRE  DISTRESS  SPEAKING IN  COMPLETE  SENTANCES

## 2011-02-28 NOTE — ED Provider Notes (Signed)
Medical screening examination/treatment/procedure(s) were performed by non-physician practitioner and as supervising physician I was immediately available for consultation/collaboration.   [facility]; MD   Sharin Grave, MD 02/28/11 [phone]

## 2011-04-06 ENCOUNTER — Other Ambulatory Visit: Payer: Self-pay | Admitting: Obstetrics and Gynecology

## 2011-04-06 DIAGNOSIS — Z1231 Encounter for screening mammogram for malignant neoplasm of breast: Secondary | ICD-10-CM

## 2011-04-21 ENCOUNTER — Encounter: Payer: BC Managed Care – PPO | Admitting: Obstetrics and Gynecology

## 2011-05-04 ENCOUNTER — Encounter: Payer: BC Managed Care – PPO | Admitting: Obstetrics and Gynecology

## 2011-05-05 ENCOUNTER — Other Ambulatory Visit (HOSPITAL_COMMUNITY)
Admission: RE | Admit: 2011-05-05 | Discharge: 2011-05-05 | Disposition: A | Discharge status: Home / Self Care | Payer: BC Managed Care – PPO | Source: Ambulatory Visit | Attending: Obstetrics and Gynecology | Admitting: Obstetrics and Gynecology

## 2011-05-05 ENCOUNTER — Encounter: Payer: Self-pay | Admitting: Obstetrics and Gynecology

## 2011-05-05 ENCOUNTER — Ambulatory Visit (INDEPENDENT_AMBULATORY_CARE_PROVIDER_SITE_OTHER): Payer: BC Managed Care – PPO | Admitting: Obstetrics and Gynecology

## 2011-05-05 VITALS — BP 110/64 | Ht 63.0 in | Wt 134.0 lb

## 2011-05-05 DIAGNOSIS — Z01419 Encounter for gynecological examination (general) (routine) without abnormal findings: Secondary | ICD-10-CM

## 2011-05-05 LAB — CBC WITH DIFFERENTIAL/PLATELET
Basophils Absolute: 0.1 10*3/uL (ref 0.0–0.1)
Eosinophils Absolute: 0.2 10*3/uL (ref 0.0–0.7)
Eosinophils Relative: 2 % (ref 0–5)
HCT: 43.2 % (ref 36.0–46.0)
Lymphs Abs: 1.7 10*3/uL (ref 0.7–4.0)
MCH: 32.9 pg (ref 26.0–34.0)
MCV: 99.5 fL (ref 78.0–100.0)
Monocytes Absolute: 0.4 10*3/uL (ref 0.1–1.0)
Platelets: 238 10*3/uL (ref 150–400)
RDW: 12.4 % (ref 11.5–15.5)

## 2011-05-05 NOTE — Progress Notes (Signed)
Patient came to see me today for her annual GYN exam. We have seen her in January with dysfunctional uterine bleeding. Her cycles are now returned to normal and are very tolerable. She contracepts by  vasectomy. She is having no pelvic pain or dyspareunia. She is having her mammogram tomorrow.  Physical examination: Kimberly Stevens present. HEENT within normal limits. Neck: Thyroid not large. No masses. Supraclavicular nodes: not enlarged. Breasts: Examined in both sitting and lying  position. No skin changes and no masses. Abdomen: Soft no guarding rebound or masses or hernia. Pelvic: External: Within normal limits. BUS: Within normal limits. Vaginal:within normal limits. Good estrogen effect. No evidence of cystocele rectocele or enterocele. Cervix: clean. Uterus: Normal size and shape. Adnexa: No masses. Rectovaginal exam: Confirmatory and negative. Extremities: Within normal limits.  Assessment: Normal GYN exam  Plan: Mammogram

## 2011-05-06 LAB — URINALYSIS W MICROSCOPIC + REFLEX CULTURE
Bilirubin Urine: NEGATIVE
Crystals: NONE SEEN
Glucose, UA: NEGATIVE mg/dL
Leukocytes, UA: NEGATIVE
Specific Gravity, Urine: 1.014 (ref 1.005–1.030)
Squamous Epithelial / LPF: NONE SEEN
pH: 6.5 (ref 5.0–8.0)

## 2011-05-07 ENCOUNTER — Ambulatory Visit
Admission: RE | Admit: 2011-05-07 | Discharge: 2011-05-07 | Disposition: A | Discharge status: Home / Self Care | Payer: BC Managed Care – PPO | Source: Ambulatory Visit | Attending: Obstetrics and Gynecology | Admitting: Obstetrics and Gynecology

## 2011-05-07 ENCOUNTER — Other Ambulatory Visit: Payer: Self-pay | Admitting: *Deleted

## 2011-05-07 DIAGNOSIS — Z1231 Encounter for screening mammogram for malignant neoplasm of breast: Secondary | ICD-10-CM

## 2011-05-07 MED ORDER — FLUCONAZOLE 150 MG PO TABS: 150.0000 mg | ORAL_TABLET | Freq: Every day | ORAL | Status: AC

## 2011-09-29 ENCOUNTER — Ambulatory Visit (INDEPENDENT_AMBULATORY_CARE_PROVIDER_SITE_OTHER): Payer: BC Managed Care – PPO | Admitting: Sports Medicine

## 2011-09-29 VITALS — BP 120/76 | Ht 63.0 in | Wt 132.0 lb

## 2011-09-29 DIAGNOSIS — S838X9A Sprain of other specified parts of unspecified knee, initial encounter: Secondary | ICD-10-CM

## 2011-09-29 NOTE — Progress Notes (Signed)
  Subjective:    Patient ID: Kimberly Stevens, female    DOB: 1965/12/22, 46 y.o.   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

## 2011-09-29 NOTE — Assessment & Plan Note (Signed)
This injury is to the left hamstring  Padded heel lifts to both of her orthotics Hamstring compression sleeve  Rehabilitation protocol with standard exercises and askling protocol  Okay to run slowly with the above changes as her gait did not show a limp while in the office  Recheck in 4-6 weeks

## 2011-09-29 NOTE — Patient Instructions (Signed)
Hamstring rehab  Hamstring curls with ankle weights 3 lbs. 1 x 15 reps. Hamstring swing 1 x 15. May use ankle weights Running lunge 1 x 15. Lying leg extension stretch 5 x 5 breaths Diver exercise 5 x 5 sec Glider 5 reps  Hip Abductor Exercises - Lateral leg lifts 3 x 15  Wear compression sleeve during exercise and for 1 hr after exercise  Followup 4-6 weeks.

## 2011-10-05 ENCOUNTER — Encounter: Payer: Self-pay | Admitting: Family Medicine

## 2011-10-05 ENCOUNTER — Ambulatory Visit (INDEPENDENT_AMBULATORY_CARE_PROVIDER_SITE_OTHER): Payer: BC Managed Care – PPO | Admitting: Family Medicine

## 2011-10-05 VITALS — BP 98/70 | Temp 98.0°F | Wt 134.0 lb

## 2011-10-05 DIAGNOSIS — M79672 Pain in left foot: Secondary | ICD-10-CM

## 2011-10-05 DIAGNOSIS — M79609 Pain in unspecified limb: Secondary | ICD-10-CM

## 2011-10-05 MED ORDER — DICLOFENAC SODIUM 1 % TD GEL: 2.0000 g | Freq: Four times a day (QID) | TRANSDERMAL | Status: DC

## 2011-10-05 NOTE — Progress Notes (Signed)
  Subjective:    Patient ID: Kimberly Stevens, female    DOB: Jan 28, 1965, 46 y.o.   MRN: [id number]  HPI  Patient seen with left great toe pain MTP joint. Started a few weeks ago. She is in the process of training for a half marathon. Not able to run much past few weeks secondary to toe soreness.  She was concerned about possible gout though she's not had any signs of inflammation such as redness or warmth or any major edema. No personal or family history of gout. No injury. She has orthotics. Had been running about 3-4 days per week-but relatively low mileage. She alternates with swimming and cycling. She has not taken any anti-inflammatory medications. No recent change of shoe wear.    Review of Systems  Constitutional: Negative for fever and chills.  Musculoskeletal: Negative for gait problem.  Skin: Negative for rash.       Objective:   Physical Exam  Constitutional: She appears well-developed and well-nourished.  Cardiovascular: Normal rate and regular rhythm.   Pulmonary/Chest: Effort normal and breath sounds normal. No respiratory distress. She has no wheezes. She has no rales.  Musculoskeletal:       Left foot reveals no erythema. No warmth. She has some slight hypertrophic changes left metatarsophalangeal joint. No localized tenderness. No metatarsal tenderness          Assessment & Plan:  Left foot pain. Mostly metatarsophalangeal joint. No evidence for gout. 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 She'll try diclofenac gel. She has scheduled followup with sports medicine in one month and discuss her orthotics and current shoe wear that time

## 2011-10-06 ENCOUNTER — Ambulatory Visit (INDEPENDENT_AMBULATORY_CARE_PROVIDER_SITE_OTHER): Payer: BC Managed Care – PPO | Admitting: Sports Medicine

## 2011-10-06 VITALS — BP 100/60 | Ht 63.0 in | Wt 134.0 lb

## 2011-10-06 DIAGNOSIS — M19079 Primary osteoarthritis, unspecified ankle and foot: Secondary | ICD-10-CM | POA: Insufficient documentation

## 2011-10-06 NOTE — Progress Notes (Signed)
  Subjective:    Patient ID: Kimberly Stevens, female    DOB: Apr 24, 1965, 46 y.o.   MRN: [id number]  HPI  Pt presents to clinic for evaluation of lt great toe pain worse x3 days after running 4 miles.  Became very sensitive even to touch of bed linens that night.   Hard to walk Saturday night due to pain. Bending toe is still painful dorsally. Saw Dr. Caryl Never who r/o gout, thinks she has arthritis. Comes for evaluation and possible ultrasound to see if we can determine the injury   Review of Systems     Objective:   Physical Exam No acute distress   Rt great toe normal motion Lt great toe 25 deg flexion, 30 deg extension  Tenderness at insertion of lt PF  PF surface nodule at 2nd MTP on lt There is some apparent degenerative change of the left first MTP joint No swelling or redness at this time  Ultrasound This shows a spur over the dorsum of the left first MTP joint There is a pocket of swelling on the proximal side of the joint that is surrounding the spur There is a smaller spur on the distal first MTP joint with slight soft tissue swelling surrounding this The joint itself has loss of joint space particularly when compared to the right first MTP joint  There is no effusion or crystalline change within the fluid to suggest gout      Assessment & Plan:

## 2011-10-06 NOTE — Assessment & Plan Note (Signed)
As Dr. Caryl Never advised her this does appear to be an arthritic change of the great toe  He is full term in jail he prescribed  We gave her a toe spacer and buddy taping to use during running We'll also placed a first ray post on her orthotics to give her some additional cushion and push off  She was able to run with a fairly normal motion and no real pain when that was accomplished  I will check on the status of this when I recheck her hamstring injury in 5 weeks

## 2011-10-06 NOTE — Patient Instructions (Addendum)
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

## 2011-11-03 ENCOUNTER — Encounter: Payer: BC Managed Care – PPO | Admitting: Sports Medicine

## 2011-11-19 ENCOUNTER — Ambulatory Visit (INDEPENDENT_AMBULATORY_CARE_PROVIDER_SITE_OTHER): Payer: BC Managed Care – PPO | Admitting: Sports Medicine

## 2011-11-19 VITALS — BP 100/60 | Ht 63.0 in | Wt 134.0 lb

## 2011-11-19 DIAGNOSIS — S838X9A Sprain of other specified parts of unspecified knee, initial encounter: Secondary | ICD-10-CM

## 2011-11-19 DIAGNOSIS — M79609 Pain in unspecified limb: Secondary | ICD-10-CM

## 2011-11-19 DIAGNOSIS — S86819A Strain of other muscle(s) and tendon(s) at lower leg level, unspecified leg, initial encounter: Secondary | ICD-10-CM

## 2011-11-19 DIAGNOSIS — M19079 Primary osteoarthritis, unspecified ankle and foot: Secondary | ICD-10-CM

## 2011-11-19 NOTE — Assessment & Plan Note (Signed)
Much improved Keep up home exercise program

## 2011-11-19 NOTE — Assessment & Plan Note (Signed)
Patient was fitted for a : standard, cushioned, semi-rigid orthotic. The orthotic was heated and afterward the patient stood on the orthotic blank positioned on the orthotic stand. The patient was positioned in subtalar neutral position and 10 degrees of ankle dorsiflexion in a weight bearing stance. After completion of molding, a stable base was applied to the orthotic blank. The blank was ground to a stable position for weight bearing. Size: 7 red EVA Base:blue EVA Posting: first ray distal Additional orthotic padding: neuroma pad  35 mins prep time  She had good support and pain relief with the new orthotics  We will recheck when necessary

## 2011-11-19 NOTE — Assessment & Plan Note (Signed)
Since she is unable to walk or run comfortably without the orthotics that protect her arthritic MTP joint we made her a second pair of orthotics today  We cut down on the first ray post so that won't be is crowded and added a neuroma pad under the second metatarsal head

## 2011-11-19 NOTE — Progress Notes (Signed)
Patient ID: Kimberly Stevens, female   DOB: 02/14/1965, 46 y.o.   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 Since she was last been seen she has done her hamstring exercises and has shown great progress. She is able to run up to 8.5 miles which is longer than before the injury. She uses the compression sleeve and we added a small heel lift to her orthotics. This seems to be symptom free at this time.  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 However the first ray post that I placed but said that too much pressure on the top of the joint. She also is getting some pain under the second metatarsal head on the left foot.  Examination Left great toe shows degenerative changes at the MTP joint. She does not have  any active extension and has only 20 of active flexion. The right MTP joint has about 15 of extension and 25 of flexion  Hamstring testing reveals that she now has about 90% normal strength on testing the left versus the right hamstrings  Gait analysis reveals that in the orthotic she is able to have a neutral forefoot strike and the gait has normalized so she has no limping

## 2012-01-20 DIAGNOSIS — K317 Polyp of stomach and duodenum: Secondary | ICD-10-CM

## 2012-01-20 HISTORY — PX: POLYPECTOMY: SHX149

## 2012-01-20 HISTORY — DX: Polyp of stomach and duodenum: K31.7

## 2012-01-20 HISTORY — PX: COLONOSCOPY: SHX174

## 2012-02-02 ENCOUNTER — Encounter: Payer: Self-pay | Admitting: Internal Medicine

## 2012-02-23 ENCOUNTER — Encounter: Payer: Self-pay | Admitting: Internal Medicine

## 2012-02-23 ENCOUNTER — Ambulatory Visit (INDEPENDENT_AMBULATORY_CARE_PROVIDER_SITE_OTHER): Payer: BC Managed Care – PPO | Admitting: Internal Medicine

## 2012-02-23 VITALS — BP 80/50 | HR 60 | Ht 63.0 in | Wt 134.8 lb

## 2012-02-23 DIAGNOSIS — R1013 Epigastric pain: Secondary | ICD-10-CM

## 2012-02-23 DIAGNOSIS — K625 Hemorrhage of anus and rectum: Secondary | ICD-10-CM

## 2012-02-23 DIAGNOSIS — K921 Melena: Secondary | ICD-10-CM

## 2012-02-23 MED ORDER — MOVIPREP 100 G PO SOLR: 1.0000 | Freq: Once | ORAL | Status: DC

## 2012-02-23 NOTE — Progress Notes (Signed)
HISTORY OF PRESENT ILLNESS:  Kimberly Stevens is a 47 y.o. female who was last seen 08/20/2008 for transient problems with abdominal discomfort, nausea, and bloating. 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 She presents today with new complaints. First, a 6 month history of recurrent epigastric discomfort. She describes it as generally occurring in the evening after meals and lasting approximately 20 minutes. This occurs anywhere between one and 3 times per week. Direct massage seems to help. No associated reflux symptoms, radiation, nausea, or vomiting. She does take NSAIDs intermittently for exercise-related discomfort. She also reports occasional dark stools. Review of the electronic medical record reveals a hemoglobin of 14.3 in April 2013. She also mentions intermittent bright red blood on the tissue after defecation. No change in bowel habits. Stable weight. Family history of diverticulitis in her father. No GI cancer.  REVIEW OF SYSTEMS:  All non-GI ROS negative except for right foot pain. Her GYN checkups are up-to-date. She is due for her annual evaluation this month  Past Medical History  Diagnosis Date  . Dermoid cyst of ovary   . IBS (irritable bowel syndrome)   . Depression     Past Surgical History  Procedure Date  . None     Social History Kimberly Stevens  reports that she has never smoked. She has never used smokeless tobacco. She reports that she drinks about one ounce of alcohol per week. She reports that she does not use illicit drugs.  family history includes Diverticulitis in her father and Leukemia in her father and maternal uncle.  There is no history of Colon cancer and Colon polyps.  Allergies  Allergen Reactions  . Amoxicillin     REACTION: tremors  . Erythromycin Ethylsuccinate     REACTION: tremors  . Penicillins     REACTION: tremors       PHYSICAL EXAMINATION: Vital signs: BP 80/50  Pulse 60  Ht 5\' 3"  (1.6 m)  Wt  134 lb 12.8 oz (61.145 kg)  BMI 23.88 kg/m2  LMP 02/09/2012  Constitutional: generally well-appearing, no acute distress Psychiatric: alert and oriented x3, cooperative Eyes: extraocular movements intact, anicteric, conjunctiva pink Mouth: oral pharynx moist, no lesions Neck: supple no lymphadenopathy Cardiovascular: heart regular rate and rhythm, no murmur Lungs: clear to auscultation bilaterally Abdomen: soft, nontender, nondistended, no obvious ascites, no peritoneal signs, normal bowel sounds, no organomegaly Rectal: Deferred until colonoscopy Extremities: no lower extremity edema bilaterally Skin: no lesions on visible extremities Neuro: No focal deficits.   ASSESSMENT:  #1. Six-month history of epigastric discomfort with increasing frequency as described. Rule out peptic ulcer disease, rule out gallbladder disease. #2. Dark stools. Question melena #3. Intermittent minor red blood per rectum. Rule out benign anorectal pathology. Rule out neoplasia    PLAN:  #1. Colonoscopy to evaluate rectal bleeding and upper endoscopy to evaluate abdominal pain and dark stools.The nature of the procedure, as well as the risks, benefits, and alternatives were carefully and thoroughly reviewed with the patient. Ample time for discussion and questions allowed. The patient understood, was satisfied, and agreed to proceed. Movi prep prescribed. The patient instructed on its use #2. Abdominal ultrasound. Rule out gallstones. Assess for other causes of pain

## 2012-02-23 NOTE — Patient Instructions (Addendum)
You have been scheduled for an endoscopy and colonoscopy with propofol. Please follow the written instructions given to you at your visit today. Please pick up your prep at the pharmacy within the next 1-3 days. If you use inhalers (even only as needed) or a CPAP machine, please bring them with you on the day of your procedure.   You have been scheduled for an abdominal ultrasound at [facility] (1st floor of hospital) on 02-25-12 at 9:30am. Please arrive 15 minutes prior to your appointment for registration. Make certain not to have anything to eat or drink 6 hours prior to your appointment. Should you need to reschedule your appointment, please contact radiology at [phone]. This test typically takes about 30 minutes to perform.

## 2012-02-24 ENCOUNTER — Ambulatory Visit (AMBULATORY_SURGERY_CENTER): Payer: BC Managed Care – PPO | Admitting: Internal Medicine

## 2012-02-24 ENCOUNTER — Encounter: Payer: Self-pay | Admitting: Internal Medicine

## 2012-02-24 VITALS — BP 110/69 | HR 59 | Temp 97.5°F | Resp 21 | Ht 63.0 in | Wt 134.0 lb

## 2012-02-24 DIAGNOSIS — D1330 Benign neoplasm of unspecified part of small intestine: Secondary | ICD-10-CM

## 2012-02-24 DIAGNOSIS — R1013 Epigastric pain: Secondary | ICD-10-CM

## 2012-02-24 DIAGNOSIS — D126 Benign neoplasm of colon, unspecified: Secondary | ICD-10-CM

## 2012-02-24 DIAGNOSIS — K921 Melena: Secondary | ICD-10-CM

## 2012-02-24 DIAGNOSIS — K625 Hemorrhage of anus and rectum: Secondary | ICD-10-CM

## 2012-02-24 MED ORDER — SODIUM CHLORIDE 0.9 % IV SOLN: 500.0000 mL | INTRAVENOUS | Status: DC

## 2012-02-24 NOTE — Progress Notes (Signed)
Patient did not experience any of the following events: a burn prior to discharge; a fall within the facility; wrong site/side/patient/procedure/implant event; or a hospital transfer or hospital admission upon discharge from the facility. (G8907) Patient did not have preoperative order for IV antibiotic SSI prophylaxis. (G8918)  

## 2012-02-24 NOTE — Op Note (Signed)
[facility] [address] [place], [address]   COLONOSCOPY PROCEDURE REPORT  PATIENT: Kimberly Stevens, Kimberly Stevens  MR#: [id number] BIRTHDATE: Aug 21, 1965 , 46  yrs. old GENDER: Female ENDOSCOPIST: Roxy Cedar, MD REFERRED BY:.  Self / Office PROCEDURE DATE:  02/24/2012 PROCEDURE:   Colonoscopy with snare polypectomy    x 1 ASA CLASS:   Class I INDICATIONS:minor rectal bleeding. MEDICATIONS: MAC sedation, administered by CRNA and propofol (Diprivan) 400mg  IV  DESCRIPTION OF PROCEDURE:   After the risks benefits and alternatives of the procedure were thoroughly explained, informed consent was obtained.  A digital rectal exam revealed no abnormalities of the rectum.   The LB CF-H180AL [id number]  endoscope was introduced through the anus and advanced to the cecum, which was identified by both the appendix and ileocecal valve. No adverse events experienced.   The quality of the prep was excellent, using MoviPrep  The instrument was then slowly withdrawn as the colon was fully examined.      COLON FINDINGS: The mucosa appeared normal in the terminal ileum. A diminutive polyp was found in the ascending colon.  A polypectomy was performed with a cold snare.  The resection was complete with no meaningful tissue available for retrieval (too small).   The colon mucosa was otherwise normal.  Retroflexed views revealed no abnormalities. The time to cecum=4 minutes 13 seconds.  Withdrawal time=15 minutes 18 seconds.  The scope was withdrawn and the procedure completed. COMPLICATIONS: There were no complications.  ENDOSCOPIC IMPRESSION: 1.   Normal mucosa in the terminal ileum 2.   Diminutive polyp was found in the ascending colon removed 3.   The colon mucosa was otherwise normal  RECOMMENDATIONS: 1.  Continue current colorectal screening recommendations for "routine risk" patients with a repeat colonoscopy in 10 years. 2.  Upper endoscopy today (see  report)   eSigned:  Roxy Cedar, MD 02/24/2012 3:45 PM   cc: The Patient and Evelena Peat, MD   PATIENT NAME:  Kimberly Stevens, Kimberly Stevens MR#: [id number]

## 2012-02-24 NOTE — Op Note (Signed)
[facility] [address] [place], [address]   ENDOSCOPY PROCEDURE REPORT  PATIENT: Kimberly Stevens, Kimberly Stevens  MR#: [id number] BIRTHDATE: 06/11/1965 , 46  yrs. old GENDER: Female ENDOSCOPIST: Roxy Cedar, MD REFERRED BY:  .  Self / Office PROCEDURE DATE:  02/24/2012 PROCEDURE:  EGD w/ biopsies ASA CLASS:     Class I INDICATIONS:  Epigastric pain.  ? melena MEDICATIONS: MAC sedation, administered by CRNA and propofol (Diprivan) 150mg  IV TOPICAL ANESTHETIC: Cetacaine Spray  DESCRIPTION OF PROCEDURE: After the risks benefits and alternatives of the procedure were thoroughly explained, informed consent was obtained.  The LB GIF-H180 [id number] endoscope was introduced through the mouth and advanced to the third portion of the duodenum. Without limitations.  The instrument was slowly withdrawn as the mucosa was fully examined.      The upper, middle and distal third of the esophagus were carefully inspected and no abnormalities were noted.  The z-line was well seen at the GEJ.  The endoscope was pushed into the fundus which was normal including a retroflexed view.  The antrum, gastric body,second and third part of the duodenum were unremarkable. An incidental benign 4mm duodenal bulb polyp was seen and removed with cold forceps. Retroflexed views revealed no abnormalities.     The scope was then withdrawn from the patient and the procedure completed.  COMPLICATIONS: There were no complications. ENDOSCOPIC IMPRESSION: 1.  Tiny Duodenal polyp - removed 2.  Otherwise Normal EGD  RECOMMENDATIONS: 1.  Await biopsy results 2.  Keep your abdominal ultrasound appointment tomorrow. Further plans thereafter to be determined  REPEAT EXAM:  eSigned:  Roxy Cedar, MD 02/24/2012 3:53 PM   IO:NGEXB Caryl Never, MD and The Patient

## 2012-02-24 NOTE — Patient Instructions (Signed)
Colon polyp and duodenal polyp removed today. Keep your abdominal ultrasound appointment tomorrow. Repeat colonoscopy in 10 years. Resume current medications. Call us with any questions or concerns. Thank you!!  YOU HAD AN ENDOSCOPIC PROCEDURE TODAY AT THE [facility]: Refer to the procedure report that was given to you for any specific questions about what was found during the examination.  If the procedure report does not answer your questions, please call your gastroenterologist to clarify.  If you requested that your care partner not be given the details of your procedure findings, then the procedure report has been included in a sealed envelope for you to review at your convenience later.  YOU SHOULD EXPECT: Some feelings of bloating in the abdomen. Passage of more gas than usual.  Walking can help get rid of the air that was put into your GI tract during the procedure and reduce the bloating. If you had a lower endoscopy (such as a colonoscopy or flexible sigmoidoscopy) you may notice spotting of blood in your stool or on the toilet paper. If you underwent a bowel prep for your procedure, then you may not have a normal bowel movement for a few days.  DIET: Your first meal following the procedure should be a light meal and then it is ok to progress to your normal diet.  A half-sandwich or bowl of soup is an example of a good first meal.  Heavy or fried foods are harder to digest and may make you feel nauseous or bloated.  Likewise meals heavy in dairy and vegetables can cause extra gas to form and this can also increase the bloating.  Drink plenty of fluids but you should avoid alcoholic beverages for 24 hours.  ACTIVITY: Your care partner should take you home directly after the procedure.  You should plan to take it easy, moving slowly for the rest of the day.  You can resume normal activity the day after the procedure however you should NOT DRIVE or use heavy machinery for 24 hours  (because of the sedation medicines used during the test).    SYMPTOMS TO REPORT IMMEDIATELY: A gastroenterologist can be reached at any hour.  During normal business hours, 8:30 AM to 5:00 PM Monday through Friday, call [phone].  After hours and on weekends, please call the GI answering service at [phone] who will take a message and have the physician on call contact you.   Following lower endoscopy (colonoscopy or flexible sigmoidoscopy):  Excessive amounts of blood in the stool  Significant tenderness or worsening of abdominal pains  Swelling of the abdomen that is new, acute  Fever of 100F or higher  Following upper endoscopy (EGD)  Vomiting of blood or coffee ground material  New chest pain or pain under the shoulder blades  Painful or persistently difficult swallowing  New shortness of breath  Fever of 100F or higher  Black, tarry-looking stools  FOLLOW UP: If any biopsies were taken you will be contacted by phone or by letter within the next 1-3 weeks.  Call your gastroenterologist if you have not heard about the biopsies in 3 weeks.  Our staff will call the home number listed on your records the next business day following your procedure to check on you and address any questions or concerns that you may have at that time regarding the information given to you following your procedure. This is a courtesy call and so if there is no answer at the home number and we  have not heard from you through the emergency physician on call, we will assume that you have returned to your regular daily activities without incident.  SIGNATURES/CONFIDENTIALITY: You and/or your care partner have signed paperwork which will be entered into your electronic medical record.  These signatures attest to the fact that that the information above on your After Visit Summary has been reviewed and is understood.  Full responsibility of the confidentiality of this discharge information lies with you  and/or your care-partner.

## 2012-02-24 NOTE — Progress Notes (Signed)
Called to room to assist during endoscopic procedure.  Patient ID and intended procedure confirmed with present staff. Received instructions for my participation in the procedure from the performing physician. [email or web address] 

## 2012-02-25 ENCOUNTER — Ambulatory Visit (HOSPITAL_COMMUNITY)
Admission: RE | Admit: 2012-02-25 | Discharge: 2012-02-25 | Disposition: A | Discharge status: Home / Self Care | Payer: BC Managed Care – PPO | Source: Ambulatory Visit | Attending: Internal Medicine | Admitting: Internal Medicine

## 2012-02-25 ENCOUNTER — Telehealth: Payer: Self-pay

## 2012-02-25 DIAGNOSIS — K625 Hemorrhage of anus and rectum: Secondary | ICD-10-CM

## 2012-02-25 DIAGNOSIS — R109 Unspecified abdominal pain: Secondary | ICD-10-CM | POA: Insufficient documentation

## 2012-02-25 DIAGNOSIS — K921 Melena: Secondary | ICD-10-CM

## 2012-02-25 DIAGNOSIS — R1013 Epigastric pain: Secondary | ICD-10-CM

## 2012-02-25 DIAGNOSIS — D7389 Other diseases of spleen: Secondary | ICD-10-CM | POA: Insufficient documentation

## 2012-02-25 NOTE — Telephone Encounter (Signed)
Left message on answering machine. 

## 2012-02-26 ENCOUNTER — Other Ambulatory Visit: Payer: Self-pay | Admitting: Internal Medicine

## 2012-02-26 MED ORDER — OMEPRAZOLE 20 MG PO CPDR: 20.0000 mg | DELAYED_RELEASE_CAPSULE | Freq: Every day | ORAL | Status: DC

## 2012-02-29 ENCOUNTER — Encounter: Payer: Self-pay | Admitting: Internal Medicine

## 2012-03-08 ENCOUNTER — Ambulatory Visit (INDEPENDENT_AMBULATORY_CARE_PROVIDER_SITE_OTHER): Payer: BC Managed Care – PPO | Admitting: Sports Medicine

## 2012-03-08 VITALS — BP 100/60 | Ht 63.0 in | Wt 132.0 lb

## 2012-03-08 DIAGNOSIS — M25559 Pain in unspecified hip: Secondary | ICD-10-CM

## 2012-03-08 NOTE — Assessment & Plan Note (Signed)
Given a series of HExercises to work for hip and thigh  Use compression sleeve  I think it is ok for her to run/ walk up hills  Work her HEP after return but if pain persistes reck/  Reck at 6 wks if doing OK

## 2012-03-08 NOTE — Patient Instructions (Signed)
It is ok to run the 1/2 marathon this weekend- you should not cause any permanent harm to your hip.  Make sure you walk some if your form starts to get bad so that you don't strain your muscles any more.  Try to keep your body upright and don't lean forward.  Use some ankle weights for your leg lifts (for the outside lifts). Also do the hip flexor exercises- use the weights if you can. Make sure you do the inside leg raises on your right side for the inside of your leg- you don't need to use weights for this one, or use a very light weight. Don't use weights if it hurts.   Do some step ups on a 4 inch step.  Run with the brace and leave it on for 30 minutes after finishing your run for some extra support.  Ibuprofen/Aleve is fine to use if it hurts.

## 2012-03-08 NOTE — Progress Notes (Signed)
47 yo F runner presents for right groin pain.  Has half marathon she is supposed to run in 5 days.  Last run was 3 days ago.  She ran her longest run, 10.62mi, ~2 weeks ago.  At around that time, her daughter encouraged her to lift her knees higher in order to run faster.  Since that time, she has been having increased pain deep in her R groin area, in her inner thigh.  Pain is worse with standing from a squat or going up stairs.  Having difficulties lifting R leg  Up.  Has been doing some hamstring and IT band exercises/stretches (she has these for her left leg).  She tried ice and motrin, which has helped.  PE: R hip: decreased hip flexor, abductor, and adductor strength; full ROM of hip; good hamstring strength  L hip: decreased abduction strength Adductor testing creates pain on RT  Running gait shows slt less motion of left leg with shorter stride Running too high on toes and looks better with more relaxed foot strike

## 2012-03-25 ENCOUNTER — Ambulatory Visit: Payer: BC Managed Care – PPO | Admitting: Internal Medicine

## 2012-04-14 ENCOUNTER — Ambulatory Visit: Payer: BC Managed Care – PPO | Admitting: Sports Medicine

## 2012-04-15 ENCOUNTER — Other Ambulatory Visit: Payer: Self-pay | Admitting: Otolaryngology

## 2012-04-15 ENCOUNTER — Ambulatory Visit
Admission: RE | Admit: 2012-04-15 | Discharge: 2012-04-15 | Disposition: A | Discharge status: Home / Self Care | Payer: BC Managed Care – PPO | Source: Ambulatory Visit | Attending: Otolaryngology | Admitting: Otolaryngology

## 2012-04-15 DIAGNOSIS — R05 Cough: Secondary | ICD-10-CM

## 2012-04-15 DIAGNOSIS — R509 Fever, unspecified: Secondary | ICD-10-CM

## 2012-04-21 ENCOUNTER — Ambulatory Visit: Payer: BC Managed Care – PPO | Admitting: Internal Medicine

## 2012-04-27 ENCOUNTER — Other Ambulatory Visit: Payer: Self-pay

## 2012-04-27 DIAGNOSIS — Z1231 Encounter for screening mammogram for malignant neoplasm of breast: Secondary | ICD-10-CM

## 2012-06-02 ENCOUNTER — Ambulatory Visit
Admission: RE | Admit: 2012-06-02 | Discharge: 2012-06-02 | Disposition: A | Discharge status: Home / Self Care | Payer: Managed Care, Other (non HMO) | Source: Ambulatory Visit

## 2012-06-02 DIAGNOSIS — Z1231 Encounter for screening mammogram for malignant neoplasm of breast: Secondary | ICD-10-CM

## 2012-09-29 ENCOUNTER — Ambulatory Visit: Payer: Managed Care, Other (non HMO) | Admitting: Sports Medicine

## 2012-10-10 ENCOUNTER — Ambulatory Visit: Payer: Self-pay | Admitting: Certified Nurse Midwife

## 2012-10-13 ENCOUNTER — Ambulatory Visit (INDEPENDENT_AMBULATORY_CARE_PROVIDER_SITE_OTHER): Payer: Managed Care, Other (non HMO) | Admitting: Certified Nurse Midwife

## 2012-10-13 ENCOUNTER — Encounter: Payer: Self-pay | Admitting: Certified Nurse Midwife

## 2012-10-13 VITALS — BP 104/60 | HR 64 | Resp 16 | Ht 63.5 in | Wt 137.0 lb

## 2012-10-13 DIAGNOSIS — N83209 Unspecified ovarian cyst, unspecified side: Secondary | ICD-10-CM

## 2012-10-13 DIAGNOSIS — N760 Acute vaginitis: Secondary | ICD-10-CM

## 2012-10-13 DIAGNOSIS — N951 Menopausal and female climacteric states: Secondary | ICD-10-CM

## 2012-10-13 DIAGNOSIS — N83201 Unspecified ovarian cyst, right side: Secondary | ICD-10-CM

## 2012-10-13 DIAGNOSIS — B373 Candidiasis of vulva and vagina: Secondary | ICD-10-CM

## 2012-10-13 DIAGNOSIS — Z1239 Encounter for other screening for malignant neoplasm of breast: Secondary | ICD-10-CM

## 2012-10-13 DIAGNOSIS — Z0000 Encounter for general adult medical examination without abnormal findings: Secondary | ICD-10-CM

## 2012-10-13 LAB — POCT URINALYSIS DIPSTICK
Blood, UA: NEGATIVE
Glucose, UA: NEGATIVE
Leukocytes, UA: NEGATIVE
Nitrite, UA: NEGATIVE
Urobilinogen, UA: NEGATIVE

## 2012-10-13 MED ORDER — FLUCONAZOLE 150 MG PO TABS: 150.0000 mg | ORAL_TABLET | Freq: Once | ORAL | Status: DC

## 2012-10-13 MED ORDER — METRONIDAZOLE 0.75 % VA GEL: 1.0000 | Freq: Every day | VAGINAL | Status: DC

## 2012-10-13 NOTE — Progress Notes (Signed)
47 y.o. G43P2002 Married Caucasian Fe here to establish gyn care, not sure if she needs annual exam, she feels like she has already had one. for annual exam. History of ovarian dermoid cyst with [place] follow up only. Last [place] ? 2013. No change per patient. Previous provider retired, not sure of her dates. Also feel she is having PMS issues. Also has tenderness in left breast at times, menstrual related?. Patient very unsure of history. Will address only issues today, and will check records and return.  Patient's last menstrual period was 09/24/2012.          Sexually active: yes  The current method of family planning is vasectomy.    Exercising: yes  cardio & weights Smoker:  no  Health Maintenance: Pap:  2014 No abnormal paps MMG:  04-27-12 normal per patient Colonoscopy:  2/14  Polyp 5 years BMD:   none TDaP: unsure per patient Labs: Poct urine-neg Self breast exam: done occ   reports that she has never smoked. She has never used smokeless tobacco. She reports that she drinks about 2.0 ounces of alcohol per week. She reports that she does not use illicit drugs.  Past Medical History  Diagnosis Date  . Dermoid cyst of ovary   . IBS (irritable bowel syndrome)   . Depression   . Polyp of duodenum 2014    tiny  . Fibrocystic breast     Past Surgical History  Procedure Laterality Date  . None      No current outpatient prescriptions on file.   No current facility-administered medications for this visit.    Family History  Problem Relation Age of Onset  . Leukemia Father     Myloma  . Leukemia Maternal Uncle   . Colon cancer Neg Hx   . Diverticulitis Father   . Colon polyps Neg Hx     ROS:  Pertinent items are noted in HPI.  Otherwise, a comprehensive ROS was negative.  Exam:   BP 104/60  Pulse 64  Resp 16  Ht 5' 3.5" (1.613 m)  Wt 137 lb (62.143 kg)  BMI 23.88 kg/m2  LMP 09/24/2012 Height: 5' 3.5" (161.3 cm)  Ht Readings from Last 3 Encounters:  10/13/12 5' 3.5"  (1.613 m)  03/08/12 5\' 3"  (1.6 m)  02/24/12 5\' 3"  (1.6 m)    General appearance: alert, cooperative and appears stated age Head: Normocephalic, without obvious abnormality, atraumatic Neck: no adenopathy, supple, symmetrical, trachea midline and thyroid normal to inspection and palpation  Breasts: normal appearance, no masses or tenderness, No nipple retraction or dimpling, No nipple discharge or bleeding, No axillary or supraclavicular adenopathy, fibrocystic feel  Abdomen: soft, non-tender; no masses,  no organomegaly  Skin: Skin color, texture, turgor normal. No rashes or lesions Lymph nodes: Cervical, supraclavicular, and axillary nodes normal. No abnormal inguinal nodes palpated Neurologic: Grossly normal   Pelvic: External genitalia:  no lesions redness noted with exudate              Urethra:  normal appearing urethra with no masses, tenderness or lesions              Bartholin's and Skene's: normal                 Vagina: normal appearing vagina with normal color and watery grey odorous discharge noted, no lesions ph 5.0 wet prep taken              Cervix: normal, non tender  Pap taken: no Bimanual Exam:  Uterus:  normal size, contour, position, consistency, mobility, non-tender and mid position              Adnexa: normal adnexa and no mass, fullness, tenderness on left. Right adnexal mass palpated 3- 4 cm size, history of dermoid cyst under PUS follow up ? Finding related to this               Rectovaginal: no lesions  Wet Prep positive for BV, Yeast, negative for Trich   A:  Normal Breast exam, fibrocystic feel, recent negative mammogram per patient  Normal pelvic exam with Right adnexal mass noted  History of Right Dermoid cyst under follow up, patient to obtain record and bring at follow up visit to compare or will need PUS, agreeable  BV  Yeast vaginitis/vulvitis  Perimenopausal with cycle changes  P:   Reviewed findings and stressed SBE, clinical exam  yearly  Discussed pelvic findings, patient having no symptoms, warning signs given. Will obtain records. PUS if needed.   Reviewed findings may be related to exercise, swimming and bike riding with moisture exposure. Discussed Olive oil protectant or vaseline external only Rx Metrogel see order Rx Diflucan see order  Discussed etiology of and given information to read, given menses calendar with normal and abnormal parameters, to advise if occurs. Lab: TSH,Vit D, FSH  Rv 2 1/2 weeks with for follow up  An After Visit Summary was printed and given to the patient.

## 2012-10-14 LAB — VITAMIN D 25 HYDROXY (VIT D DEFICIENCY, FRACTURES): Vit D, 25-Hydroxy: 43 ng/mL (ref 30–89)

## 2012-10-16 NOTE — Progress Notes (Signed)
Note reviewed, agree with plan.  Almee Pelphrey, MD  

## 2012-10-18 ENCOUNTER — Telehealth: Payer: Self-pay

## 2012-10-18 NOTE — Telephone Encounter (Signed)
lmtcb

## 2012-10-18 NOTE — Telephone Encounter (Signed)
Message copied by Eliezer Bottom on Tue Oct 18, 2012  4:47 PM ------      Message from: Verner Chol      Created: Tue Oct 18, 2012  4:22 PM       Notify [facility] does not show menopause at this time      Thyroid normal      Vitamin D normal range to maintain recommendation is OTC Vit D 3 1000 IU daily      Has she obtained her chart and other GYN information to bring with her at next appointment? ------

## 2012-10-19 NOTE — Telephone Encounter (Signed)
Left message to call back  

## 2012-10-19 NOTE — Telephone Encounter (Signed)
Patient notified

## 2012-10-19 NOTE — Telephone Encounter (Signed)
Patient was trying to return Joy's call at 5:10pm on 9/30. Said she would be unavailable between 9-11 today but could call before or after that.

## 2012-10-27 ENCOUNTER — Ambulatory Visit: Payer: Managed Care, Other (non HMO) | Admitting: Certified Nurse Midwife

## 2012-10-31 ENCOUNTER — Encounter: Payer: Self-pay | Admitting: Certified Nurse Midwife

## 2012-10-31 ENCOUNTER — Ambulatory Visit (INDEPENDENT_AMBULATORY_CARE_PROVIDER_SITE_OTHER): Payer: Managed Care, Other (non HMO) | Admitting: Certified Nurse Midwife

## 2012-10-31 VITALS — BP 100/60 | HR 64 | Resp 16 | Wt 136.0 lb

## 2012-10-31 DIAGNOSIS — N760 Acute vaginitis: Secondary | ICD-10-CM

## 2012-10-31 NOTE — Progress Notes (Signed)
47 y.o. [name] G2P2002 here for follow up of BV and Yeast vaginitis treated with Metrogel and Diflucan initiated on September 29,2014. Completed all medication as directed.  Denies any symptoms of itching, burning, or increase in discharge. Also here to review records and lab work. Patient requested records from former GYN, not received.  Patient's anxiety issue resolved, no hot flashes or night sweats."I think it was just PMS and my friends telling me I was in menopause". Patient received lab results of TSH,FSH and Vitamin D per our office and felt much better. No concerns today.   O: Healthy WD,WN female Affect: Normal, oreintation x 3  Pelvic exam:EXTERNAL GENITALIA: normal appearing vulva with no masses, tenderness or lesions VAGINA: no abnormal discharge or lesions, Wet Prep/KOH no pathogens and ph 4.0 CERVIX: no lesions or cervical motion tenderness and normal appearnace  A:BV and Yeast vaginitis Resolved  2-No records received regarding Dermoid cyst Accessed last 3 aex per epic with no mention in notes of Dermoid cyst. Noted last aex 4/13. Patient due now for aex. Mammogram now in epic normal. Previous CT of pelvis,mentions no abnormalities in pelvis dated 07/19/08 3-FSH 12.4, TSH 1.496 Vit. D 43  P: Discussed findings of BV and yeast resolved. 2-Discussed current limited record findings. Patient to go to other office and obtain records pertaining to PUS and dermoid cyst. Discussed Aex exam due now, patient will schedule. 3-Reviewed with patient lab results. FSH not menopausal, but due to cycle change history,perimenopausal. Discussed etiology and cycle expectations. Continue menses calendar and advise if no menses by first of December. Reviewed that other labs normal. Patient relieved.    RV aex, prn

## 2012-11-01 ENCOUNTER — Encounter: Payer: Self-pay | Admitting: Certified Nurse Midwife

## 2012-11-03 NOTE — Progress Notes (Signed)
Note reviewed, agree with plan.  Lasundra Hascall, MD  

## 2012-11-08 ENCOUNTER — Ambulatory Visit (INDEPENDENT_AMBULATORY_CARE_PROVIDER_SITE_OTHER): Payer: Managed Care, Other (non HMO) | Admitting: Sports Medicine

## 2012-11-08 ENCOUNTER — Encounter: Payer: Self-pay | Admitting: Sports Medicine

## 2012-11-08 VITALS — BP 100/66 | Ht 64.0 in | Wt 134.0 lb

## 2012-11-08 DIAGNOSIS — M25561 Pain in right knee: Secondary | ICD-10-CM

## 2012-11-08 DIAGNOSIS — M25569 Pain in unspecified knee: Secondary | ICD-10-CM

## 2012-11-08 DIAGNOSIS — M7662 Achilles tendinitis, left leg: Secondary | ICD-10-CM | POA: Insufficient documentation

## 2012-11-08 DIAGNOSIS — M7660 Achilles tendinitis, unspecified leg: Secondary | ICD-10-CM

## 2012-11-08 MED ORDER — NITROGLYCERIN 0.2 MG/HR TD PT24: MEDICATED_PATCH | TRANSDERMAL | Status: DC

## 2012-11-08 NOTE — Patient Instructions (Signed)
Nitroglycerin Protocol   Apply 1/4 nitroglycerin patch to affected area daily.  Change position of patch within the affected area every 24 hours.  You may experience a headache during the first 1-2 weeks of using the patch, these should subside.  If you experience headaches after beginning nitroglycerin patch treatment, you may take your preferred over the counter pain reliever.  Another side effect of the nitroglycerin patch is skin irritation or rash related to patch adhesive.  Please notify our office if you develop more severe headaches or rash, and stop the patch.  Tendon healing with nitroglycerin patch may require 12 to 24 weeks depending on the extent of injury.  Men should not use if taking Viagra, Cialis, or Levitra.   Do not use if you have migraines or rosacea.   Please do suggested exercises daily   Please follow up in 4-6 weeks  Thank you for seeing Kimberly Stevens today!

## 2012-11-08 NOTE — Progress Notes (Signed)
  Subjective:    Patient ID: Kimberly Stevens, female    DOB: [birth year], 47 y.o.   MRN: [id number]  HPI Comments: 47 year old female who is an avid runner who presents with left Achilles tendon pain. First noticed pain approximately 8 weeks ago and worsens after extended periods of running. First 30 minutes of running is usually okay, and then it gradually worsens to where she can no longer stand it by the end of her run. Also notices that it is worse with running uphill. Additionally, notices pain is increased with certain shoes, noticeably wedges and heels, for which she puts an increased strain on her Achilles. Denies history of trauma or acute injury. Additionally, she relates history of increasing pain over her right patella over the last few days. Does not note any history of trauma. Pain is worse with running as well as bending and weightbearing.     Review of Systems  All other systems reviewed and are negative.       Objective:   Physical Exam  Constitutional: She appears well-developed and well-nourished.  Musculoskeletal:  Right knee: Normal alignment. No effusions, warmth, erythema, tenderness. Full active and passive range of motion without limitation. No ligamentous laxity. Negative McMurray's. Full-strength.  Left ankle: Nodule noted approximately 2 cm above the calcaneal insertion. Mildly tender to palpation. Full range of motion of the left ankle. No point tenderness over the medial or lateral malleoli. No warmth, erythema, tenderness. Normal and full strength dorsi and plantar flexion. Intact Achilles tendon.    [facility] Ultrasound: The left Achilles tendon shows increased inflammation superior surface of nodule. Nodule is located approximately 2 cm above the calcaneal insertion point. Tendon is inflamed and enlarged to 0.6 cm in width on the affected side versus 0.4 cm in width on the nonaffected Achilles. Additionally, ultrasound of the right patellar tendon and patella  shows very small avulsion spur at the inferior pole of the patella.      Assessment & Plan:  47 year old runner with left Achilles tendinitis and small right inferior patella avulsion spur  #1. Left Achilles tendinitis -Strengthening exercises discussed in full detail. Remainder of activities as tolerated. May continue to run. Nitroglycerin patches, one quarter patch every 24 hours discussed.  #2. Right small inferior patellar avulsion spur -Orthotic knee support provided. (patellar helix)

## 2012-11-08 NOTE — Assessment & Plan Note (Signed)
Eccentric exercise protocol  Nitroglycerin protocol  Heel lifts  Recheck 6 weeks

## 2012-11-08 NOTE — Assessment & Plan Note (Signed)
We will utilize declined squad exercises Patellar tendon strap  If the tendon doesn't look more normal on her return visit we may consider adding nitroglycerin treatment to this as well

## 2012-11-15 ENCOUNTER — Ambulatory Visit (INDEPENDENT_AMBULATORY_CARE_PROVIDER_SITE_OTHER): Payer: Managed Care, Other (non HMO) | Admitting: Obstetrics & Gynecology

## 2012-11-15 ENCOUNTER — Encounter: Payer: Self-pay | Admitting: Obstetrics & Gynecology

## 2012-11-15 ENCOUNTER — Ambulatory Visit: Payer: Managed Care, Other (non HMO) | Admitting: Certified Nurse Midwife

## 2012-11-15 VITALS — BP 100/60 | HR 68 | Resp 18 | Ht 63.5 in | Wt 138.0 lb

## 2012-11-15 DIAGNOSIS — Z124 Encounter for screening for malignant neoplasm of cervix: Secondary | ICD-10-CM

## 2012-11-15 DIAGNOSIS — Z01419 Encounter for gynecological examination (general) (routine) without abnormal findings: Secondary | ICD-10-CM

## 2012-11-15 DIAGNOSIS — Z23 Encounter for immunization: Secondary | ICD-10-CM

## 2012-11-15 NOTE — Addendum Note (Signed)
Addended by: Jerene Bears on: 11/15/2012 01:48 PM   Modules accepted: Orders

## 2012-11-15 NOTE — Patient Instructions (Signed)

## 2012-11-15 NOTE — Progress Notes (Addendum)
47 y.o. [id number] MarriedCaucasianF here for annual exam.  Cycles are "fairly" regular.  Flow lasting 8 days.  Was on the pill for years.  Hasn't been on any method in six or seven years.  Spouse with vasectomy.  Having some heel issues/pain due to running.  Using nitroglycerine on her heel.  Tried Lo-loestrin for cycles.  She had irregular bleeding with it.  Former pt of Dr. Eda Paschal.  Good friend of Fredirick Maudlin.  Spouse is Teacher, English as a foreign language of [place].  Patient's last menstrual period was 10/28/2012.          Sexually active: yes  The current method of family planning is vasectomy.    Exercising: yes  Gym/ health club routine includes cardio, light weights, stationary bike and swimming. 7 days a week Smoker:  no  Health Maintenance: Pap: 2012 neg History of abnormal Pap:  no MMG:  05/2012 normal Colonoscopy:  02/2012 (removed polyps), Dr. Marina Goodell, f/u 10 years BMD:   never TDaP: not sure when Screening Labs:  Hb today: 14.7,  Urine today: declined   reports that she has never smoked. She has never used smokeless tobacco. She reports that she drinks about 2.0 ounces of alcohol per week. She reports that she does not use illicit drugs.  Past Medical History  Diagnosis Date  . Dermoid cyst of ovary   . IBS (irritable bowel syndrome)   . Depression   . Polyp of duodenum 2014    tiny  . Fibrocystic breast     Past Surgical History  Procedure Laterality Date  . Wisdom tooth extraction      Current Outpatient Prescriptions  Medication Sig Dispense Refill  . nitroGLYCERIN (NITRODUR - DOSED IN MG/24 HR) 0.2 mg/hr patch Apply 1/4 patch to affected area daily.  Change patch every 24 hours.  30 patch  1  . dextromethorphan (DELSYM) 30 MG/5ML liquid Take 60 mg by mouth as needed for cough.      . Dextromethorphan-Guaifenesin (MUCINEX DM PO) Take 600 mg by mouth daily.       No current facility-administered medications for this visit.    Family History  Problem Relation Age of Onset  . Leukemia  Father     Myloma  . Diverticulitis Father   . Leukemia Maternal Uncle   . Colon cancer Neg Hx   . Colon polyps Neg Hx     ROS:  Pertinent items are noted in HPI.  Otherwise, a comprehensive ROS was negative.  Exam:   BP 100/60  Pulse 68  Resp 18  Ht 5' 3.5" (1.613 m)  Wt 138 lb (62.596 kg)  BMI 24.06 kg/m2  LMP 10/28/2012   Height: 5' 3.5" (161.3 cm)  Ht Readings from Last 3 Encounters:  11/15/12 5' 3.5" (1.613 m)  11/08/12 5\' 4"  (1.626 m)  10/13/12 5' 3.5" (1.613 m)    General appearance: alert, cooperative and appears stated age Head: Normocephalic, without obvious abnormality, atraumatic Neck: no adenopathy, supple, symmetrical, trachea midline and thyroid normal to inspection and palpation Lungs: clear to auscultation bilaterally Breasts: normal appearance, no masses or tenderness Heart: regular rate and rhythm Abdomen: soft, non-tender; bowel sounds normal; no masses,  no organomegaly Extremities: extremities normal, atraumatic, no cyanosis or edema Skin: Skin color, texture, turgor normal. No rashes or lesions Lymph nodes: Cervical, supraclavicular, and axillary nodes normal. No abnormal inguinal nodes palpated Neurologic: Grossly normal   Pelvic: External genitalia:  no lesions  Urethra:  normal appearing urethra with no masses, tenderness or lesions              Bartholins and Skenes: normal                 Vagina: normal appearing vagina with normal color and discharge, no lesions              Cervix: no lesions              Pap taken: yes Bimanual Exam:  Uterus:  normal size, contour, position, consistency, mobility, non-tender              Adnexa: normal adnexa and no mass, fullness, tenderness               Rectovaginal: Confirms               Anus:  normal sphincter tone, no lesions  A:  Well Woman with normal exam Cycles are changing.  Precautions given to patient for calling.   H/O dermoid?  CT 2010 in [facility] showed no findings.  Will get  records from Dr. Verl Dicker office. (12/02/12--outside records reviewed 5x92mm focus in right ovary unchanged since 2005.  Last scan was 2012.  I do not feel she needs a repeat U/S as area is so small and watched for 7 years.  Called pt personally and shared this information.)  P:   Mammogram yearly.  Grade 3 dense breasts.  3D MMG yearly recommended. pap smear with HR HPV today Tdap today return annually or prn  An After Visit Summary was printed and given to the patient.

## 2012-11-16 ENCOUNTER — Ambulatory Visit: Payer: Managed Care, Other (non HMO) | Admitting: Certified Nurse Midwife

## 2012-12-05 ENCOUNTER — Ambulatory Visit (INDEPENDENT_AMBULATORY_CARE_PROVIDER_SITE_OTHER): Payer: Managed Care, Other (non HMO) | Admitting: Certified Nurse Midwife

## 2012-12-05 ENCOUNTER — Encounter: Payer: Self-pay | Admitting: Certified Nurse Midwife

## 2012-12-05 ENCOUNTER — Telehealth: Payer: Self-pay | Admitting: Obstetrics & Gynecology

## 2012-12-05 VITALS — BP 100/64 | HR 68 | Temp 97.6°F | Resp 16 | Ht 63.5 in | Wt 138.0 lb

## 2012-12-05 DIAGNOSIS — N390 Urinary tract infection, site not specified: Secondary | ICD-10-CM

## 2012-12-05 LAB — POCT URINALYSIS DIPSTICK
Glucose, UA: NEGATIVE
Ketones, UA: NEGATIVE
Protein, UA: NEGATIVE

## 2012-12-05 MED ORDER — NITROFURANTOIN MONOHYD MACRO 100 MG PO CAPS: 100.0000 mg | ORAL_CAPSULE | Freq: Two times a day (BID) | ORAL | Status: DC

## 2012-12-05 MED ORDER — PHENAZOPYRIDINE HCL 200 MG PO TABS: 200.0000 mg | ORAL_TABLET | Freq: Three times a day (TID) | ORAL | Status: DC | PRN

## 2012-12-05 NOTE — Telephone Encounter (Signed)
Spoke with patient. Scheduled appointment for today at 11:15 at patient's request.

## 2012-12-05 NOTE — Progress Notes (Signed)
S:  47 y.o.[name] presents with 2 day onset of UTI symptoms of suprapubic abdominal pain, burning with urination, urinary frequency, urinary urgency.  Pertinent negatives include The patient is having no constitutional symptoms, denying fever, chills, anorexia, or weight loss, headache or backache..  Symptoms related to post coital Yes Current method of birth control vasectomy. Same partner without change. Last UTI years ago per patient after sexually activity.  ROS: feels well  O alert, oriented to person, place, and time   healthy,  not in acute distress, well developed and well nourished  Skin warm and dry  Mild suprapubic pain  No CVA tenderness bilateral  Pelvic exam: external genital: normal  Bladder, urethra, tender, urethral meatus tender and slightly red  Vagina: normal discharge  Poct urine-wbc 1+, nitirite pos  Assessment:UTI post coital P: Reviewed findings consistent with UTI Rx Macrobid see order Rx Pyridium see order Lab: Urine micro/culture  Maintain adequate hydration. Follow up if symptoms not improving, and as needed. Lab:TOC if Urine Culture is positive  RV prn

## 2012-12-05 NOTE — Telephone Encounter (Signed)
Patient thinks she has a UTI. Nothing for miller today or tomorrow has seen debbi before but i didn't have anything to put her in with debbi today had something for tomorrow but patient wants to come today.

## 2012-12-06 ENCOUNTER — Ambulatory Visit: Payer: Managed Care, Other (non HMO) | Admitting: Sports Medicine

## 2012-12-06 LAB — URINALYSIS, MICROSCOPIC ONLY
Crystals: NONE SEEN
Squamous Epithelial / LPF: NONE SEEN

## 2012-12-06 NOTE — Progress Notes (Signed)
Note reviewed, agree with plan.  Adilynne Fitzwater, MD  

## 2012-12-07 ENCOUNTER — Telehealth: Payer: Self-pay | Admitting: Certified Nurse Midwife

## 2012-12-07 ENCOUNTER — Telehealth: Payer: Self-pay

## 2012-12-07 LAB — URINE CULTURE: Colony Count: 50000

## 2012-12-07 NOTE — Telephone Encounter (Signed)
Message copied by Eliezer Bottom on Wed Dec 07, 2012 10:58 AM ------      Message from: Verner Chol      Created: Wed Dec 07, 2012 10:37 AM       Notify culture positive for E. Coli      Needs toc 2 weeks, ov ------

## 2012-12-07 NOTE — Telephone Encounter (Signed)
Patient notified see labs 

## 2012-12-07 NOTE — Telephone Encounter (Signed)
Debbi this patient is on Macrobid and Pyridium and still having urinary urgency. What do you advise at this point?

## 2012-12-07 NOTE — Telephone Encounter (Signed)
lmtcb

## 2012-12-07 NOTE — Telephone Encounter (Signed)
Pt says the antibiotics are not helping for the bladder infection and wondering if there is anything else to do at this point.

## 2012-12-08 NOTE — Telephone Encounter (Signed)
Spoke with pt to advise it may take 7 days to clear infection. Pt says she is feeling better today and has not needed Pyridium. Advised pt she can take 2 cranberry capsules a day. Pt will try if needed. Pt to call back if symptoms still linger in a couple of days.

## 2012-12-08 NOTE — Telephone Encounter (Signed)
She had a only 50,000 E. Coli, but still may take up to 7 days to clear. She had a lot of resistance to other medications  She can start cranberry capsules 2 times daily also. This should help.

## 2012-12-08 NOTE — Telephone Encounter (Signed)
Returning Tracy's call. °

## 2012-12-08 NOTE — Telephone Encounter (Signed)
LMTCB  aa 

## 2012-12-08 NOTE — Telephone Encounter (Signed)
Message left to return call to Kimberly Stevens at [phone].    

## 2012-12-21 ENCOUNTER — Ambulatory Visit (INDEPENDENT_AMBULATORY_CARE_PROVIDER_SITE_OTHER): Payer: Managed Care, Other (non HMO) | Admitting: Certified Nurse Midwife

## 2012-12-21 ENCOUNTER — Encounter: Payer: Self-pay | Admitting: Certified Nurse Midwife

## 2012-12-21 VITALS — BP 100/64 | HR 64 | Resp 16 | Ht 63.5 in | Wt 140.0 lb

## 2012-12-21 DIAGNOSIS — N390 Urinary tract infection, site not specified: Secondary | ICD-10-CM

## 2012-12-21 LAB — POCT URINALYSIS DIPSTICK
Bilirubin, UA: NEGATIVE
Blood, UA: NEGATIVE
Glucose, UA: NEGATIVE

## 2012-12-21 NOTE — Progress Notes (Signed)
47 y.o. [name] G2P2002 here for follow up of UTI  E Coli  treated with Macrobid initiated on 12/05/12. Completed all medication as directed.  Denies any symptoms of urinary frequency, urgency, or pain. " Feels so much better". Had no problems completing medication. On antibiotics now for dog bite! Working on weight with exercise and weight lifting. No other health issues today.   O: Healthy WD,WN female Affect: normal, orientation x 3 Skin: warm and dry Abdomen:non tender, negative suprapubic pain CVAT negative bilateral Pelvic exam:EXTERNAL GENITALIA: normal appearing vulva with no masses, tenderness or lesions Bladder, urethra urethral meatus non tender VAGINA: no abnormal discharge or lesions CERVIX: no lesions or cervical motion tenderness  A: E.Coli UTI probably resolved    P: Discussed findings of normal exam. Aware of warning signs of UTI and need to advise. Continue to increase water intake and avoid soda.  Poct urine-wbc trace Labs: Urine culture   RV prn

## 2012-12-22 LAB — URINE CULTURE: Organism ID, Bacteria: NO GROWTH

## 2012-12-24 NOTE — Progress Notes (Signed)
Note reviewed, agree with plan.  Anibal Quinby, MD  

## 2013-02-07 ENCOUNTER — Encounter: Payer: Self-pay | Admitting: Certified Nurse Midwife

## 2013-02-07 ENCOUNTER — Ambulatory Visit (INDEPENDENT_AMBULATORY_CARE_PROVIDER_SITE_OTHER): Payer: Managed Care, Other (non HMO) | Admitting: Certified Nurse Midwife

## 2013-02-07 VITALS — BP 114/75 | HR 60 | Temp 98.5°F | Resp 16 | Ht 63.5 in | Wt 138.0 lb

## 2013-02-07 DIAGNOSIS — N390 Urinary tract infection, site not specified: Secondary | ICD-10-CM

## 2013-02-07 MED ORDER — NITROFURANTOIN MONOHYD MACRO 100 MG PO CAPS: 100.0000 mg | ORAL_CAPSULE | Freq: Two times a day (BID) | ORAL | Status: DC

## 2013-02-07 MED ORDER — PHENAZOPYRIDINE HCL 200 MG PO TABS: 200.0000 mg | ORAL_TABLET | Freq: Three times a day (TID) | ORAL | Status: DC | PRN

## 2013-02-07 NOTE — Progress Notes (Signed)
S:  48 y.o.[name] presents with UTI  symptoms of suprapubic area abdominal pain, burning with urination, urinary frequency, urinary urgency. Onset of symptoms 3 days ago. Patient spent several days with spouse in [place] with frequent intercourse and symptoms started occurring shortly after return home. Pertinent negatives include The patient is having no constitutional symptoms, denying fever, chills, anorexia, or weight loss..  Current method of birth control spouse vasectomy.  Last UTI documented  11/14 after sexual activity with E.Coli  Noted with culture.  ROS: no weight loss, fever, night sweats, feels well and fatigued  O alert, oriented to person, place, and time   healthy, well developed and well nourished  Abdomen: positive suprapubic tenderness  Skin: warm and dry  CVAT: negative bilateral Pelvic exam: External genital normal, no lesions  Bladder, urethra, and urethral meatus all tender  Uterus: normal, non tender  Adnexa: non tender, no masses  Poct urine- unable to dip,pt took med that dyed urine  Assessment:UTI, post coital  P:Reviewed findings consistent with UTI Rx Macrobid see order Rx Pyridium see order Discussed post coital treatment if culture positive. KGM:WNUUV culture   RV: TOC with OV, prn

## 2013-02-07 NOTE — Progress Notes (Signed)
Reviewed personally.  M. Suzanne Nickolette Espinola, MD.  

## 2013-02-07 NOTE — Patient Instructions (Signed)
Urinary Tract Infection  Urinary tract infections (UTIs) can develop anywhere along your urinary tract. Your urinary tract is your body's drainage system for removing wastes and extra water. Your urinary tract includes two kidneys, two ureters, a bladder, and a urethra. Your kidneys are a pair of bean-shaped organs. Each kidney is about the size of your fist. They are located below your ribs, one on each side of your spine.  CAUSES  Infections are caused by microbes, which are microscopic organisms, including fungi, viruses, and bacteria. These organisms are so small that they can only be seen through a microscope. Bacteria are the microbes that most commonly cause UTIs.  SYMPTOMS   Symptoms of UTIs may vary by age and gender of the patient and by the location of the infection. Symptoms in young women typically include a frequent and intense urge to urinate and a painful, burning feeling in the bladder or urethra during urination. Older women and men are more likely to be tired, shaky, and weak and have muscle aches and abdominal pain. A fever may mean the infection is in your kidneys. Other symptoms of a kidney infection include pain in your back or sides below the ribs, nausea, and vomiting.  DIAGNOSIS  To diagnose a UTI, your caregiver will ask you about your symptoms. Your caregiver also will ask to provide a urine sample. The urine sample will be tested for bacteria and white blood cells. White blood cells are made by your body to help fight infection.  TREATMENT   Typically, UTIs can be treated with medication. Because most UTIs are caused by a bacterial infection, they usually can be treated with the use of antibiotics. The choice of antibiotic and length of treatment depend on your symptoms and the type of bacteria causing your infection.  HOME CARE INSTRUCTIONS   If you were prescribed antibiotics, take them exactly as your caregiver instructs you. Finish the medication even if you feel better after you  have only taken some of the medication.   Drink enough water and fluids to keep your urine clear or pale yellow.   Avoid caffeine, tea, and carbonated beverages. They tend to irritate your bladder.   Empty your bladder often. Avoid holding urine for long periods of time.   Empty your bladder before and after sexual intercourse.   After a bowel movement, women should cleanse from front to back. Use each tissue only once.  SEEK MEDICAL CARE IF:    You have back pain.   You develop a fever.   Your symptoms do not begin to resolve within 3 days.  SEEK IMMEDIATE MEDICAL CARE IF:    You have severe back pain or lower abdominal pain.   You develop chills.   You have nausea or vomiting.   You have continued burning or discomfort with urination.  MAKE SURE YOU:    Understand these instructions.   Will watch your condition.   Will get help right away if you are not doing well or get worse.  Document Released: 10/15/2004 Document Revised: 07/07/2011 Document Reviewed: 02/13/2011  [employer] Patient Information 2014 [employer].

## 2013-02-09 LAB — URINE CULTURE

## 2013-02-21 ENCOUNTER — Ambulatory Visit (INDEPENDENT_AMBULATORY_CARE_PROVIDER_SITE_OTHER): Payer: Managed Care, Other (non HMO) | Admitting: Certified Nurse Midwife

## 2013-02-21 VITALS — BP 104/60 | HR 68 | Resp 16 | Ht 63.5 in | Wt 139.0 lb

## 2013-02-21 DIAGNOSIS — N898 Other specified noninflammatory disorders of vagina: Secondary | ICD-10-CM

## 2013-02-21 DIAGNOSIS — N390 Urinary tract infection, site not specified: Secondary | ICD-10-CM

## 2013-02-21 LAB — POCT URINALYSIS DIPSTICK
BILIRUBIN UA: NEGATIVE
GLUCOSE UA: NEGATIVE
KETONES UA: NEGATIVE
Nitrite, UA: NEGATIVE
Protein, UA: NEGATIVE
RBC UA: NEGATIVE
UROBILINOGEN UA: NEGATIVE
pH, UA: 8

## 2013-02-21 MED ORDER — NITROFURANTOIN MONOHYD MACRO 100 MG PO CAPS: ORAL_CAPSULE | ORAL | Status: DC

## 2013-02-21 NOTE — Progress Notes (Signed)
48 y.o. [name] G2P2002 here for follow up of E.Coli UTI (post coital) treated with Macrobid/Pyridium initiated on 02/07/13. Completed all medication as directed.  Denies any symptoms of Urinary frequency, urgency or pain. Denies any fever or chills. Patient has noticed slight increase in vaginal discharge, but went swimming. Denies vaginal itching, burning or odor. No new personal products. No other health issues today   O: Healthy WD,WN female Affect: Normal, orientation x 3 Skin:warm and dry Abdomen:Non tender, negative suprapubic CVAT: negative bilateral Pelvic exam:EXTERNAL GENITALIA: normal appearing vulva with no masses, tenderness or lesions Bladder, urethra, urethral meatus non tender VAGINA: normal appearing discharge, with scant blood noted(patient starting period) ph 4.0 Wet prep taken CERVIX: no lesions or cervical motion tenderness and non tender,negative UTERUS: normal, non tender ADNEXA: no masses palpable and nontender  Wet Prep: negative for patogens  A:UTI Resolved  Negative wet prep    P: Discussed findings of UTI appears to be resolved. Labs Urine micro,culture Discussed Macrobid use for post coital UTI. Patient would like to try.  Rx Macrobid see order Reviewed negative wet prep.   RV prn

## 2013-02-22 LAB — URINALYSIS, MICROSCOPIC ONLY
Bacteria, UA: NONE SEEN
CASTS: NONE SEEN
Crystals: NONE SEEN
SQUAMOUS EPITHELIAL / LPF: NONE SEEN

## 2013-02-22 LAB — URINE CULTURE
COLONY COUNT: NO GROWTH
ORGANISM ID, BACTERIA: NO GROWTH

## 2013-02-24 ENCOUNTER — Encounter: Payer: Self-pay | Admitting: Certified Nurse Midwife

## 2013-02-24 NOTE — Progress Notes (Signed)
Reviewed personally.  M. Suzanne Laquandra Carrillo, MD.  

## 2013-04-10 ENCOUNTER — Telehealth: Payer: Self-pay | Admitting: Obstetrics & Gynecology

## 2013-04-10 NOTE — Telephone Encounter (Signed)
Patient had a questions about a bill

## 2013-04-10 NOTE — Telephone Encounter (Signed)
Patient says she is returning a call to Billing.

## 2013-05-12 ENCOUNTER — Other Ambulatory Visit: Payer: Self-pay

## 2013-05-12 DIAGNOSIS — Z1231 Encounter for screening mammogram for malignant neoplasm of breast: Secondary | ICD-10-CM

## 2013-06-05 ENCOUNTER — Ambulatory Visit
Admission: RE | Admit: 2013-06-05 | Discharge: 2013-06-05 | Disposition: A | Discharge status: Home / Self Care | Payer: Managed Care, Other (non HMO) | Source: Ambulatory Visit

## 2013-06-05 DIAGNOSIS — Z1231 Encounter for screening mammogram for malignant neoplasm of breast: Secondary | ICD-10-CM

## 2013-06-06 ENCOUNTER — Encounter: Payer: Self-pay | Admitting: Sports Medicine

## 2013-06-06 ENCOUNTER — Ambulatory Visit (INDEPENDENT_AMBULATORY_CARE_PROVIDER_SITE_OTHER): Payer: Managed Care, Other (non HMO) | Admitting: Sports Medicine

## 2013-06-06 VITALS — BP 121/74 | Ht 64.0 in | Wt 134.0 lb

## 2013-06-06 DIAGNOSIS — S86819A Strain of other muscle(s) and tendon(s) at lower leg level, unspecified leg, initial encounter: Principal | ICD-10-CM

## 2013-06-06 DIAGNOSIS — S838X9A Sprain of other specified parts of unspecified knee, initial encounter: Secondary | ICD-10-CM

## 2013-06-06 MED ORDER — GABAPENTIN 300 MG PO CAPS: 300.0000 mg | ORAL_CAPSULE | Freq: Every day | ORAL | Status: DC

## 2013-06-06 NOTE — Progress Notes (Signed)
Patient ID: Kimberly Stevens, female   DOB: 1965/08/02, 48 y.o.   MRN: [id number]  Achilles got better within 10 days of NTG patches (miraculous by patient) Took it only 4 to 6 wks and stopped since she had no pain  Now tightness and some mild pain with RT HS and with adductors Heel pad pain that is bilateral without the other symptoms of plantar fasciitis Some tightness w left HS Calves tight but no pain  Menstruation is prolonged now She is also having some significant hot flashes  The muscle symptoms do not primarily seem associated with her exercise  She continues run 2-3 miles at a time but that trigger some of the muscle pain. Working out in the [employer] or swimming does not seem to cause pain.  Examination No acute distress BP 121/74  Ht 5\' 4"  (1.626 m)  Wt 134 lb (60.782 kg)  BMI 22.99 kg/m2  Examination of the hips reveal normal range of motion Negative straight leg raise Excellent strength on hip flexion abduction and extension Excellent adductor strength bilaterally Mild tenderness to palpation at the she'll tuberosity attachment of the right hamstring No tenderness to palpation of the left hamstring Hamstring strength testing is excellent  Running gait shows good form and forefoot strike

## 2013-06-06 NOTE — Assessment & Plan Note (Signed)
While she has a chronic hamstring strain I think some of the issues may be related to poor sleep patterns and to summary musculoskeletal changes with menopause  We will try gabapentin 300 at night to see if she gets less spasm in her hamstring and better sleep pattern  After 2 weeks if the muscle does not improve we'll also add a half patch nitroglycerin  She will continue her other strength exercises as her strength today was quite good  Let me know progress after one month and then recheck in 2 months

## 2013-07-04 ENCOUNTER — Ambulatory Visit: Payer: Managed Care, Other (non HMO) | Admitting: Sports Medicine

## 2013-07-18 ENCOUNTER — Encounter: Payer: Self-pay | Admitting: Family Medicine

## 2013-07-18 ENCOUNTER — Ambulatory Visit (INDEPENDENT_AMBULATORY_CARE_PROVIDER_SITE_OTHER): Payer: Managed Care, Other (non HMO) | Admitting: Family Medicine

## 2013-07-18 VITALS — BP 130/87 | HR 70 | Ht 64.0 in | Wt 136.0 lb

## 2013-07-18 DIAGNOSIS — M7650 Patellar tendinitis, unspecified knee: Secondary | ICD-10-CM

## 2013-07-18 DIAGNOSIS — M7651 Patellar tendinitis, right knee: Secondary | ICD-10-CM

## 2013-07-18 DIAGNOSIS — M722 Plantar fascial fibromatosis: Secondary | ICD-10-CM

## 2013-07-18 NOTE — Patient Instructions (Signed)
You have plantar fasciitis Take tylenol or aleve as needed for pain  Plantar fascia stretch for 20-30 seconds (do 3 of these) in morning, up to 3-4 times a day. Lowering/raise on a step exercises 3 x 10 once or twice a day - this is very important for long term recovery. Can add heel walks, toe walks forward and backward as well Ice heel for 15 minutes as needed. Avoid flat shoes/barefoot walking as much as possible. Arch straps have been shown to help with pain. Continue using orthotics. Steroid injection is a consideration for short term pain relief if you are struggling. Physical therapy is also an option. Activities as tolerated otherwise.  You have patellar tendinitis. Icing as needed. Use patellar strap regularly when up and walking around. Straight leg raises, knee extensions, decline squats 3 sets of 10 once a day (ok to start with fewer and work your way up). Can consider nitro patches, physical therapy if not improving. Follow up with me in 5-6 weeks.

## 2013-07-20 ENCOUNTER — Encounter: Payer: Self-pay | Admitting: Family Medicine

## 2013-07-20 DIAGNOSIS — M7651 Patellar tendinitis, right knee: Secondary | ICD-10-CM | POA: Insufficient documentation

## 2013-07-20 DIAGNOSIS — M722 Plantar fascial fibromatosis: Secondary | ICD-10-CM | POA: Insufficient documentation

## 2013-07-20 NOTE — Assessment & Plan Note (Signed)
Home exercise program with stretches reviewed.  Icing, arch binders, OTC orthotics.  Consider PT, injection if not improving.

## 2013-07-20 NOTE — Assessment & Plan Note (Signed)
Patellar strap provided.  Reviewed home exercise program.  Icing, nsaids as needed.  Consider nitro patches, physical therapy if not improving.  F/u in 5-6 weeks for both issues.

## 2013-07-20 NOTE — Progress Notes (Signed)
Patient ID: Kimberly Stevens, female   DOB: 1965-06-06, 48 y.o.   MRN: [id number]  PCP: Eulas Post, MD  Subjective:   HPI: Patient is a 48 y.o. female here for left foot, right knee pain.  Patient states has had pain in left foot since march. Present on the bottom of her foot, different from prior achilles issues (this has resolved). Nitro helped as well as HEP. Calves feel tight. Has been stretching and icing. Had to stop running because of the pain in foot.  Also has right knee pain - told has a bone spur under patella by Dr. Oneida Alar. Had patellar bodyhelix strap but cannot find this. No catching, locking, giving out.  Past Medical History  Diagnosis Date  . Dermoid cyst of ovary   . IBS (irritable bowel syndrome)   . Depression   . Polyp of duodenum 2014    tiny  . Fibrocystic breast     Current Outpatient Prescriptions on File Prior to Visit  Medication Sig Dispense Refill  . gabapentin (NEURONTIN) 300 MG capsule Take 1 capsule (300 mg total) by mouth at bedtime.  30 capsule  2  . nitrofurantoin, macrocrystal-monohydrate, (MACROBID) 100 MG capsule Take one capsule post coital as needed  30 capsule  1  . nitroGLYCERIN (NITRODUR - DOSED IN MG/24 HR) 0.2 mg/hr patch Apply 1/4 patch to affected area daily.  Change patch every 24 hours.  30 patch  1  . UNABLE TO FIND daily. Juice plus       No current facility-administered medications on file prior to visit.    Past Surgical History  Procedure Laterality Date  . Wisdom tooth extraction      Allergies  Allergen Reactions  . Erythromycin Ethylsuccinate     REACTION: tremors  . Penicillins     REACTION: tremors    History   Social History  . Marital Status: Married    Spouse Name: N/A    Number of Children: N/A  . Years of Education: N/A   Occupational History  . Not on file.   Social History Main Topics  . Smoking status: Never Smoker   . Smokeless tobacco: Never Used  . Alcohol Use: 2.0 oz/week    4 drink(s) per week     Comment: 3-4 glasses of wine a week  . Drug Use: No  . Sexual Activity: Yes    Partners: Male     Comment: Vasectomy   Other Topics Concern  . Not on file   Social History Narrative  . No narrative on file    Family History  Problem Relation Age of Onset  . Leukemia Father     Myloma  . Diverticulitis Father   . Leukemia Maternal Uncle   . Colon cancer Neg Hx   . Colon polyps Neg Hx     BP 130/87  Pulse 70  Ht 5\' 4"  (1.626 m)  Wt 136 lb (61.689 kg)  BMI 23.33 kg/m2  Review of Systems: See HPI above.    Objective:  Physical Exam:  Gen: NAD  Left foot/ankle: No gross deformity, swelling, ecchymoses FROM TTP anterior calcaneus at PF insertion. Negative ant drawer and talar tilt.   Negative syndesmotic compression. Thompsons test negative. Negative calcaneal squeeze NV intact distally.  Right knee: No gross deformity, ecchymoses, swelling.  Crepitation. Mild TTP inferior patellar pole.  No joint line, other tenderness. FROM. Negative ant/post drawers. Negative valgus/varus testing. Negative lachmanns. Negative mcmurrays, apleys, patellar apprehension. NV intact distally.  Assessment & Plan:  1. Left plantar fasciitis - Home exercise program with stretches reviewed.  Icing, arch binders, OTC orthotics.  Consider PT, injection if not improving.  2. Patellar tendinitis - Patellar strap provided.  Reviewed home exercise program.  Icing, nsaids as needed.  Consider nitro patches, physical therapy if not improving.  F/u in 5-6 weeks for both issues.

## 2013-08-17 ENCOUNTER — Telehealth: Payer: Self-pay | Admitting: Obstetrics & Gynecology

## 2013-08-17 NOTE — Telephone Encounter (Signed)
Patient called during lunch left message saying she wanted to make an appt for her daughter. Tried to call her back lmtcb she can speak with anyone.

## 2013-08-28 ENCOUNTER — Ambulatory Visit: Payer: Managed Care, Other (non HMO) | Admitting: Family Medicine

## 2013-09-21 ENCOUNTER — Ambulatory Visit: Payer: Managed Care, Other (non HMO) | Admitting: Sports Medicine

## 2013-10-05 ENCOUNTER — Encounter: Payer: Self-pay | Admitting: Family Medicine

## 2013-10-05 ENCOUNTER — Ambulatory Visit (INDEPENDENT_AMBULATORY_CARE_PROVIDER_SITE_OTHER): Payer: Managed Care, Other (non HMO) | Admitting: Family Medicine

## 2013-10-05 VITALS — BP 114/76 | HR 65 | Ht 64.0 in | Wt 138.0 lb

## 2013-10-05 DIAGNOSIS — M7650 Patellar tendinitis, unspecified knee: Secondary | ICD-10-CM

## 2013-10-05 DIAGNOSIS — M722 Plantar fascial fibromatosis: Secondary | ICD-10-CM

## 2013-10-05 DIAGNOSIS — M7651 Patellar tendinitis, right knee: Secondary | ICD-10-CM

## 2013-10-05 NOTE — Patient Instructions (Addendum)
You have plantar fasciitis Start physical therapy at [facility] st location. Do home exercises on days you don't go to therapy. Arch binder when up and walking around. Try night splints. Call me if you want to try a cortisone shot. Follow up with me in 6 weeks.  Start 1/4th nitro patch over patellar tendon, change every 24 hours. Consider physical therapy for this as well.

## 2013-10-09 ENCOUNTER — Encounter: Payer: Self-pay | Admitting: Family Medicine

## 2013-10-09 NOTE — Progress Notes (Signed)
Patient ID: Kimberly Stevens, female   DOB: 01-06-1966, 48 y.o.   MRN: [id number]  PCP: Eulas Post, MD  Subjective:   HPI: Patient is a 48 y.o. female here for left foot, right knee pain.  6/30: Patient states has had pain in left foot since march. Present on the bottom of her foot, different from prior achilles issues (this has resolved). Nitro helped as well as HEP. Calves feel tight. Has been stretching and icing. Had to stop running because of the pain in foot.  Also has right knee pain - told has a bone spur under patella by Dr. Oneida Alar. Had patellar bodyhelix strap but cannot find this. No catching, locking, giving out.  9/17: Patient reports her left heel pain is severe, 3/66 with certain movements. Bothering her with sleep as well. Not doing much for exercise. Doing home exercises though and stretches we showed her. Using orthotics. Feels better with stretching - would like to try night splints. Arch binders were too tight. Some problems with knee still - would like to try nitro patches (has some still at home).  Past Medical History  Diagnosis Date  . Dermoid cyst of ovary   . IBS (irritable bowel syndrome)   . Depression   . Polyp of duodenum 2014    tiny  . Fibrocystic breast     Current Outpatient Prescriptions on File Prior to Visit  Medication Sig Dispense Refill  . gabapentin (NEURONTIN) 300 MG capsule Take 1 capsule (300 mg total) by mouth at bedtime.  30 capsule  2  . nitrofurantoin, macrocrystal-monohydrate, (MACROBID) 100 MG capsule Take one capsule post coital as needed  30 capsule  1  . nitroGLYCERIN (NITRODUR - DOSED IN MG/24 HR) 0.2 mg/hr patch Apply 1/4 patch to affected area daily.  Change patch every 24 hours.  30 patch  1  . UNABLE TO FIND daily. Juice plus       No current facility-administered medications on file prior to visit.    Past Surgical History  Procedure Laterality Date  . Wisdom tooth extraction      Allergies   Allergen Reactions  . Erythromycin Ethylsuccinate     REACTION: tremors  . Penicillins     REACTION: tremors    History   Social History  . Marital Status: Married    Spouse Name: N/A    Number of Children: N/A  . Years of Education: N/A   Occupational History  . Not on file.   Social History Main Topics  . Smoking status: Never Smoker   . Smokeless tobacco: Never Used  . Alcohol Use: 2.0 oz/week    4 drink(s) per week     Comment: 3-4 glasses of wine a week  . Drug Use: No  . Sexual Activity: Yes    Partners: Male     Comment: Vasectomy   Other Topics Concern  . Not on file   Social History Narrative  . No narrative on file    Family History  Problem Relation Age of Onset  . Leukemia Father     Myloma  . Diverticulitis Father   . Leukemia Maternal Uncle   . Colon cancer Neg Hx   . Colon polyps Neg Hx     BP 114/76  Pulse 65  Ht 5\' 4"  (1.626 m)  Wt 138 lb (62.596 kg)  BMI 23.68 kg/m2  Review of Systems: See HPI above.    Objective:  Physical Exam:  Gen: NAD  Left foot/ankle: No  gross deformity, swelling, ecchymoses FROM TTP anterior calcaneus at PF insertion. Negative ant drawer and talar tilt.   Negative syndesmotic compression. Thompsons test negative. Negative calcaneal squeeze NV intact distally.  Right knee: No gross deformity, ecchymoses, swelling.  Crepitation. Mild TTP inferior patellar pole.  No joint line, other tenderness. FROM. Negative ant/post drawers. Negative valgus/varus testing. Negative lachmanns. Negative mcmurrays, apleys, patellar apprehension. NV intact distally.    Assessment & Plan:  1. Left plantar fasciitis - Exam unchanged.  Has been resting but also negative calcaneal squeeze - very unlikely a stress fracture.  Better fitted arch binders provided.  Continue home exercises but add physical therapy.  She would like to try night splints - script given for these.  Consider injection if not improving.  F/u in 6  weeks.  2. Patellar tendinitis - Continue patellar strap, add physical therapy and nitro patches.  Icing, nsaids as needed.

## 2013-10-09 NOTE — Assessment & Plan Note (Signed)
Exam unchanged.  Has been resting but also negative calcaneal squeeze - very unlikely a stress fracture.  Better fitted arch binders provided.  Continue home exercises but add physical therapy.  She would like to try night splints - script given for these.  Consider injection if not improving.  F/u in 6 weeks.

## 2013-10-09 NOTE — Assessment & Plan Note (Signed)
Continue patellar strap, add physical therapy and nitro patches.  Icing, nsaids as needed.

## 2013-10-12 ENCOUNTER — Ambulatory Visit: Payer: Managed Care, Other (non HMO) | Admitting: Sports Medicine

## 2013-10-23 ENCOUNTER — Ambulatory Visit: Payer: Managed Care, Other (non HMO) | Attending: Family Medicine

## 2013-10-23 DIAGNOSIS — M722 Plantar fascial fibromatosis: Secondary | ICD-10-CM | POA: Insufficient documentation

## 2013-10-23 DIAGNOSIS — M79669 Pain in unspecified lower leg: Secondary | ICD-10-CM | POA: Insufficient documentation

## 2013-10-23 DIAGNOSIS — M7651 Patellar tendinitis, right knee: Secondary | ICD-10-CM | POA: Insufficient documentation

## 2013-10-23 DIAGNOSIS — R5381 Other malaise: Secondary | ICD-10-CM | POA: Diagnosis not present

## 2013-10-23 DIAGNOSIS — Z5189 Encounter for other specified aftercare: Secondary | ICD-10-CM | POA: Diagnosis not present

## 2013-10-23 DIAGNOSIS — M25572 Pain in left ankle and joints of left foot: Secondary | ICD-10-CM | POA: Insufficient documentation

## 2013-10-30 ENCOUNTER — Ambulatory Visit: Payer: Managed Care, Other (non HMO)

## 2013-10-30 DIAGNOSIS — Z5189 Encounter for other specified aftercare: Secondary | ICD-10-CM | POA: Diagnosis not present

## 2013-11-03 ENCOUNTER — Other Ambulatory Visit: Payer: Self-pay

## 2013-11-06 ENCOUNTER — Ambulatory Visit: Payer: Managed Care, Other (non HMO)

## 2013-11-06 DIAGNOSIS — Z5189 Encounter for other specified aftercare: Secondary | ICD-10-CM | POA: Diagnosis not present

## 2013-11-10 ENCOUNTER — Ambulatory Visit: Payer: Managed Care, Other (non HMO) | Admitting: Physical Therapy

## 2013-11-10 DIAGNOSIS — Z5189 Encounter for other specified aftercare: Secondary | ICD-10-CM | POA: Diagnosis not present

## 2013-11-13 ENCOUNTER — Ambulatory Visit: Payer: Managed Care, Other (non HMO)

## 2013-11-13 DIAGNOSIS — Z5189 Encounter for other specified aftercare: Secondary | ICD-10-CM | POA: Diagnosis not present

## 2013-11-15 ENCOUNTER — Encounter: Payer: Managed Care, Other (non HMO) | Admitting: Physical Therapy

## 2013-11-16 ENCOUNTER — Ambulatory Visit (INDEPENDENT_AMBULATORY_CARE_PROVIDER_SITE_OTHER): Payer: Managed Care, Other (non HMO) | Admitting: Family Medicine

## 2013-11-16 ENCOUNTER — Encounter: Payer: Self-pay | Admitting: Family Medicine

## 2013-11-16 VITALS — BP 108/70 | HR 57 | Ht 64.0 in | Wt 138.0 lb

## 2013-11-16 DIAGNOSIS — M7651 Patellar tendinitis, right knee: Secondary | ICD-10-CM

## 2013-11-16 DIAGNOSIS — M722 Plantar fascial fibromatosis: Secondary | ICD-10-CM

## 2013-11-16 NOTE — Patient Instructions (Signed)
Continue using the arch binder as needed. Continue physical therapy until completed but do home exercises another 6 weeks. Use nitro patches for another 6 weeks for your knee.You have plantar fasciitis Night splints as needed as well. Follow up with me in 6 weeks.

## 2013-11-20 ENCOUNTER — Encounter: Payer: Self-pay | Admitting: Family Medicine

## 2013-11-20 NOTE — Progress Notes (Signed)
Patient ID: Kimberly Stevens, female   DOB: [birth year], 48 y.o.   MRN: [id number]  PCP: Eulas Post, MD  Subjective:   HPI: Patient is a 48 y.o. female here for left foot, right knee pain.  6/30: Patient states has had pain in left foot since march. Present on the bottom of her foot, different from prior achilles issues (this has resolved). Nitro helped as well as HEP. Calves feel tight. Has been stretching and icing. Had to stop running because of the pain in foot.  Also has right knee pain - told has a bone spur under patella by Dr. Oneida Alar. Had patellar bodyhelix strap but cannot find this. No catching, locking, giving out.  9/17: Patient reports her left heel pain is severe, 2/87 with certain movements. Bothering her with sleep as well. Not doing much for exercise. Doing home exercises though and stretches we showed her. Using orthotics. Feels better with stretching - would like to try night splints. Arch binders were too tight. Some problems with knee still - would like to try nitro patches (has some still at home).  10/29: Patient reports she is much better from plantar fasciitis and patellar tendinitis. Wearing arch binder, doing physical therapy with massage and ultrasound. Stiff in morning. Using night splints. Doing home exercises too.   Past Medical History  Diagnosis Date  . Dermoid cyst of ovary   . IBS (irritable bowel syndrome)   . Depression   . Polyp of duodenum 2014    tiny  . Fibrocystic breast     Current Outpatient Prescriptions on File Prior to Visit  Medication Sig Dispense Refill  . gabapentin (NEURONTIN) 300 MG capsule Take 1 capsule (300 mg total) by mouth at bedtime. 30 capsule 2  . nitrofurantoin, macrocrystal-monohydrate, (MACROBID) 100 MG capsule Take one capsule post coital as needed 30 capsule 1  . nitroGLYCERIN (NITRODUR - DOSED IN MG/24 HR) 0.2 mg/hr patch Apply 1/4 patch to affected area daily.  Change patch every 24 hours. 30  patch 1  . UNABLE TO FIND daily. Juice plus     No current facility-administered medications on file prior to visit.    Past Surgical History  Procedure Laterality Date  . Wisdom tooth extraction      Allergies  Allergen Reactions  . Erythromycin Ethylsuccinate     REACTION: tremors  . Penicillins     REACTION: tremors    History   Social History  . Marital Status: Married    Spouse Name: N/A    Number of Children: N/A  . Years of Education: N/A   Occupational History  . Not on file.   Social History Main Topics  . Smoking status: Never Smoker   . Smokeless tobacco: Never Used  . Alcohol Use: 2.0 oz/week    4 drink(s) per week     Comment: 3-4 glasses of wine a week  . Drug Use: No  . Sexual Activity:    Partners: Male     Comment: Vasectomy   Other Topics Concern  . Not on file   Social History Narrative    Family History  Problem Relation Age of Onset  . Leukemia Father     Myloma  . Diverticulitis Father   . Leukemia Maternal Uncle   . Colon cancer Neg Hx   . Colon polyps Neg Hx     BP 108/70 mmHg  Pulse 57  Ht 5\' 4"  (1.626 m)  Wt 138 lb (62.596 kg)  BMI 23.68 kg/m2  Review of Systems: See HPI above.    Objective:  Physical Exam:  Gen: NAD  Left foot/ankle: No gross deformity, swelling, ecchymoses FROM Minimal TTP anterior calcaneus at PF insertion. Negative ant drawer and talar tilt.   Negative syndesmotic compression. Thompsons test negative. Negative calcaneal squeeze NV intact distally.  Right knee: No gross deformity, ecchymoses, swelling.  Crepitation. Minimal TTP inferior patellar pole.  No joint line, other tenderness. FROM. Negative ant/post drawers. Negative valgus/varus testing. Negative lachmanns. Negative mcmurrays, apleys, patellar apprehension. NV intact distally.    Assessment & Plan:  1. Left plantar fasciitis - Much improved with home exercises, PT, night splints, arch binders.  Continue with these measures  - home exercises another 6 weeks.  2. Patellar tendinitis - Continue patellar strap, physical therapy and nitro patches.  Icing, nsaids as needed.  F/u in 6 weeks.

## 2013-11-20 NOTE — Assessment & Plan Note (Signed)
Continue patellar strap, physical therapy and nitro patches.  Icing, nsaids as needed.  F/u in 6 weeks.

## 2013-11-20 NOTE — Assessment & Plan Note (Signed)
Much improved with home exercises, PT, night splints, arch binders.  Continue with these measures - home exercises another 6 weeks.

## 2013-11-30 MED ORDER — NITROGLYCERIN 0.2 MG/HR TD PT24: MEDICATED_PATCH | TRANSDERMAL | Status: DC

## 2013-11-30 NOTE — Addendum Note (Signed)
Addended by: Dene Gentry on: 11/30/2013 09:56 AM   Modules accepted: Orders

## 2013-12-05 ENCOUNTER — Ambulatory Visit: Payer: Managed Care, Other (non HMO) | Attending: Family Medicine | Admitting: Physical Therapy

## 2013-12-05 DIAGNOSIS — M722 Plantar fascial fibromatosis: Secondary | ICD-10-CM | POA: Diagnosis not present

## 2013-12-05 DIAGNOSIS — Z5189 Encounter for other specified aftercare: Secondary | ICD-10-CM | POA: Diagnosis not present

## 2013-12-05 DIAGNOSIS — M7651 Patellar tendinitis, right knee: Secondary | ICD-10-CM | POA: Diagnosis not present

## 2013-12-05 DIAGNOSIS — M79669 Pain in unspecified lower leg: Secondary | ICD-10-CM | POA: Insufficient documentation

## 2013-12-05 DIAGNOSIS — R5381 Other malaise: Secondary | ICD-10-CM | POA: Insufficient documentation

## 2013-12-05 DIAGNOSIS — M25572 Pain in left ankle and joints of left foot: Secondary | ICD-10-CM | POA: Diagnosis not present

## 2013-12-08 ENCOUNTER — Ambulatory Visit: Payer: Managed Care, Other (non HMO) | Admitting: Obstetrics & Gynecology

## 2013-12-11 ENCOUNTER — Ambulatory Visit: Payer: Managed Care, Other (non HMO) | Admitting: Physical Therapy

## 2013-12-11 DIAGNOSIS — Z5189 Encounter for other specified aftercare: Secondary | ICD-10-CM | POA: Diagnosis not present

## 2013-12-19 ENCOUNTER — Encounter: Payer: Self-pay | Admitting: Family Medicine

## 2013-12-19 ENCOUNTER — Ambulatory Visit (INDEPENDENT_AMBULATORY_CARE_PROVIDER_SITE_OTHER): Payer: Managed Care, Other (non HMO) | Admitting: Family Medicine

## 2013-12-19 VITALS — BP 101/69 | HR 69 | Ht 64.0 in | Wt 138.0 lb

## 2013-12-19 DIAGNOSIS — M79604 Pain in right leg: Secondary | ICD-10-CM

## 2013-12-19 NOTE — Patient Instructions (Signed)
You have strained your gluteus medius and proximal hamstring. Take ibuprofen 600mg  three times a day with food OR aleve 2 tabs twice a day with food for pain and inflammation for 10 days then as needed. Consider compression shorts. Heat 15 minutes at a time before activity, icing 15 minutes after exercising. Do hamstring curls, swings, hip side raises 3 sets of 10 once a day. Can add ankle weight if these become too easy. Can add in running lunge in about 1-2 weeks. Ok for exercising otherwise as long as not limping with the activity and pain remains less than a 3 on a scale of 1-10. I suspect this will take 3-4 weeks to resolve. Follow up with me in 1 month to 6 weeks.

## 2013-12-21 DIAGNOSIS — M7061 Trochanteric bursitis, right hip: Secondary | ICD-10-CM | POA: Insufficient documentation

## 2013-12-21 NOTE — Progress Notes (Signed)
PCP: Eulas Post, MD  Subjective:   HPI: Patient is a 48 y.o. female here for right hamstring strain.  Patient reports she injured her right hamstring about 6-7 years ago. Then about 3 weeks ago doing exercises she felt/heard a pop proximal medial hamstring area. Very tender. Can feel with deadlift, quick walking and jogging - mostly bothers her afterwards instead of during activities.  Past Medical History  Diagnosis Date  . Dermoid cyst of ovary   . IBS (irritable bowel syndrome)   . Depression   . Polyp of duodenum 2014    tiny  . Fibrocystic breast     Current Outpatient Prescriptions on File Prior to Visit  Medication Sig Dispense Refill  . gabapentin (NEURONTIN) 300 MG capsule Take 1 capsule (300 mg total) by mouth at bedtime. 30 capsule 2  . nitrofurantoin, macrocrystal-monohydrate, (MACROBID) 100 MG capsule Take one capsule post coital as needed 30 capsule 1  . nitroGLYCERIN (NITRODUR - DOSED IN MG/24 HR) 0.2 mg/hr patch Apply 1/4 patch to affected area daily.  Change patch every 24 hours. 30 patch 1  . UNABLE TO FIND daily. Juice plus     No current facility-administered medications on file prior to visit.    Past Surgical History  Procedure Laterality Date  . Wisdom tooth extraction      Allergies  Allergen Reactions  . Erythromycin Ethylsuccinate     REACTION: tremors  . Penicillins     REACTION: tremors    History   Social History  . Marital Status: Married    Spouse Name: N/A    Number of Children: N/A  . Years of Education: N/A   Occupational History  . Not on file.   Social History Main Topics  . Smoking status: Never Smoker   . Smokeless tobacco: Never Used  . Alcohol Use: 2.4 oz/week    4 Not specified per week     Comment: 3-4 glasses of wine a week  . Drug Use: No  . Sexual Activity:    Partners: Male     Comment: Vasectomy   Other Topics Concern  . Not on file   Social History Narrative    Family History  Problem  Relation Age of Onset  . Leukemia Father     Myloma  . Diverticulitis Father   . Leukemia Maternal Uncle   . Colon cancer Neg Hx   . Colon polyps Neg Hx     BP 101/69 mmHg  Pulse 69  Ht 5\' 4"  (1.626 m)  Wt 138 lb (62.596 kg)  BMI 23.68 kg/m2  Review of Systems: See HPI above.    Objective:  Physical Exam:  Gen: NAD  Right leg/hip: No gross deformity, swelling, bruising. TTP mildly proximal medial hamstring.  No other tenderness. FROM hip and knee.  4/5 strength with knee flexion at 30 degrees and with hip abduction.  5/5 at 90 degrees, other muscle groups right lower extremity. NVI distally.    Assessment & Plan:  1. Right leg pain - 2/2 right hamstring strain, gluteus medius strain.  Regular nsaids, heat.  Shown home exercises to do daily.  Activities as tolerated.  F/u in 1 month to 6 weeks.  Consider physical therapy if not improving.

## 2013-12-21 NOTE — Assessment & Plan Note (Signed)
2/2 right hamstring strain, gluteus medius strain.  Regular nsaids, heat.  Shown home exercises to do daily.  Activities as tolerated.  F/u in 1 month to 6 weeks.  Consider physical therapy if not improving.

## 2013-12-26 ENCOUNTER — Ambulatory Visit: Payer: Managed Care, Other (non HMO) | Admitting: Family Medicine

## 2014-01-16 ENCOUNTER — Ambulatory Visit (INDEPENDENT_AMBULATORY_CARE_PROVIDER_SITE_OTHER): Payer: Managed Care, Other (non HMO) | Admitting: Obstetrics & Gynecology

## 2014-01-16 ENCOUNTER — Encounter: Payer: Self-pay | Admitting: Obstetrics & Gynecology

## 2014-01-16 VITALS — BP 106/62 | HR 64 | Resp 16 | Ht 63.5 in | Wt 137.6 lb

## 2014-01-16 DIAGNOSIS — N938 Other specified abnormal uterine and vaginal bleeding: Secondary | ICD-10-CM

## 2014-01-16 DIAGNOSIS — Z0000 Encounter for general adult medical examination without abnormal findings: Secondary | ICD-10-CM

## 2014-01-16 DIAGNOSIS — Z01419 Encounter for gynecological examination (general) (routine) without abnormal findings: Secondary | ICD-10-CM

## 2014-01-16 DIAGNOSIS — Z124 Encounter for screening for malignant neoplasm of cervix: Secondary | ICD-10-CM

## 2014-01-16 LAB — POCT URINALYSIS DIPSTICK
Bilirubin, UA: NEGATIVE
Blood, UA: NEGATIVE
GLUCOSE UA: NEGATIVE
Ketones, UA: NEGATIVE
NITRITE UA: NEGATIVE
Protein, UA: NEGATIVE
UROBILINOGEN UA: NEGATIVE
pH, UA: 5

## 2014-01-16 LAB — COMPREHENSIVE METABOLIC PANEL
ALT: 11 U/L (ref 0–35)
AST: 16 U/L (ref 0–37)
Albumin: 4.4 g/dL (ref 3.5–5.2)
Alkaline Phosphatase: 49 U/L (ref 39–117)
BUN: 15 mg/dL (ref 6–23)
CALCIUM: 9.2 mg/dL (ref 8.4–10.5)
CO2: 25 mEq/L (ref 19–32)
Chloride: 103 mEq/L (ref 96–112)
Creat: 0.99 mg/dL (ref 0.50–1.10)
Glucose, Bld: 84 mg/dL (ref 70–99)
Potassium: 4.1 mEq/L (ref 3.5–5.3)
SODIUM: 137 meq/L (ref 135–145)
TOTAL PROTEIN: 7 g/dL (ref 6.0–8.3)
Total Bilirubin: 0.5 mg/dL (ref 0.2–1.2)

## 2014-01-16 LAB — LIPID PANEL
CHOLESTEROL: 181 mg/dL (ref 0–200)
HDL: 60 mg/dL (ref >39)
LDL Cholesterol: 99 mg/dL (ref 0–99)
Total CHOL/HDL Ratio: 3 Ratio
Triglycerides: 109 mg/dL (ref <150)
VLDL: 22 mg/dL (ref 0–40)

## 2014-01-16 LAB — HEMOGLOBIN, FINGERSTICK: HEMOGLOBIN, FINGERSTICK: 14.7 g/dL (ref 12.0–16.0)

## 2014-01-16 MED ORDER — METRONIDAZOLE 0.75 % EX GEL
1.0000 | Freq: Every day | CUTANEOUS | Status: DC
Start: 2014-01-16 — End: 2014-09-17

## 2014-01-16 NOTE — Progress Notes (Signed)
48 y.o. [id number] MarriedCaucasianF here for annual exam.  Doing well.  Cycles are all over the place.  She has had bleeding for up to 12-14 days.  Flow isn't heavy.    Having increased leakage with coughing and sneezing.  Use to do triathlons but pt really having trouble with long runs.  Feels like she leaks down her legs at time.    Having continued issues with heel.  Seeing Dr. Oneida Alar.  PT did really help.    Patient's last menstrual period was 12/23/2013.          Sexually active: Yes.    The current method of family planning is vasectomy.    Exercising: Yes.    weight training, swimming, walking, and biking Smoker:  no  Health Maintenance: Pap:  11/15/12 WNL/negative HR HPV History of abnormal Pap:  no MMG:  06/05/13 3D-normal Colonoscopy:  2/14 Dr Candise Che in 10 years BMD:   none TDaP:  11/15/12 Screening Labs: today, Hb today: 14.7, Urine today: WBC-trace   reports that she has never smoked. She has never used smokeless tobacco. She reports that she drinks alcohol. She reports that she does not use illicit drugs.  Past Medical History  Diagnosis Date  . Dermoid cyst of ovary   . IBS (irritable bowel syndrome)   . Depression   . Polyp of duodenum 2014    tiny  . Fibrocystic breast   . Arthritis     left great toe joint  . Plantar fasciitis     left foot 2/15-11/15  . Tendonitis     right knee 2/15-11/15  . Eye disorder     like glaucoma-being followed by Dr Tyrone Schimke    Past Surgical History  Procedure Laterality Date  . Wisdom tooth extraction      Current Outpatient Prescriptions  Medication Sig Dispense Refill  . loratadine (CLARITIN) 10 MG tablet Take 10 mg by mouth daily.    . nitrofurantoin, macrocrystal-monohydrate, (MACROBID) 100 MG capsule Take one capsule post coital as needed 30 capsule 1  . UNABLE TO FIND daily. Juice plus    . metroNIDAZOLE (METROGEL) 0.75 % gel Apply 1 application topically.     No current facility-administered medications for this  visit.    Family History  Problem Relation Age of Onset  . Leukemia Father     Myloma  . Diverticulitis Father   . Leukemia Maternal Uncle   . Colon cancer Neg Hx   . Colon polyps Neg Hx   . Arthritis Mother   . Arthritis Maternal Grandmother   . Arthritis Paternal Grandmother     ROS:  Pertinent items are noted in HPI.  Otherwise, a comprehensive ROS was negative.  Exam:   BP 106/62 mmHg  Pulse 64  Resp 16  Ht 5' 3.5" (1.613 m)  Wt 137 lb 9.6 oz (62.415 kg)  BMI 23.99 kg/m2  LMP 12/23/2013   Height: 5' 3.5" (161.3 cm)  Ht Readings from Last 3 Encounters:  01/16/14 5' 3.5" (1.613 m)  12/19/13 5\' 4"  (1.626 m)  11/16/13 5\' 4"  (1.626 m)    General appearance: alert, cooperative and appears stated age Head: Normocephalic, without obvious abnormality, atraumatic Neck: no adenopathy, supple, symmetrical, trachea midline and thyroid normal to inspection and palpation Lungs: clear to auscultation bilaterally Breasts: normal appearance, no masses or tenderness Heart: regular rate and rhythm Abdomen: soft, non-tender; bowel sounds normal; no masses,  no organomegaly Extremities: extremities normal, atraumatic, no cyanosis or edema Skin: Skin color, texture,  turgor normal. No rashes or lesions Lymph nodes: Cervical, supraclavicular, and axillary nodes normal. No abnormal inguinal nodes palpated Neurologic: Grossly normal   Pelvic: External genitalia:  no lesions              Urethra:  normal appearing urethra with no masses, tenderness or lesions              Bartholins and Skenes: normal                 Vagina: normal appearing vagina with normal color and discharge, no lesions, atrophic appearance              Cervix: no lesions              Pap taken: Yes.   Bimanual Exam:  Uterus:  normal size, contour, position, consistency, mobility, non-tender              Adnexa: normal adnexa and no mass, fullness, tenderness               Rectovaginal: Confirms                Anus:  normal sphincter tone, no lesions  Chaperone was present for exam.  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 Grade 3 dense breasts. 3D MMG yearly recommended. Will schedule PUS Will need consult with Dr. Quincy Simmonds and urodynamics Nitrofurantoin rx not needed Rx for Metrogel 0.75% qh for up to three days prn symptoms pap smear with HR HPV today 2014.  Pap done today. TSH, Lipid panel, CMP FSH today return annually or prn

## 2014-01-17 LAB — FOLLICLE STIMULATING HORMONE: FSH: 58.5 m[IU]/mL

## 2014-01-17 LAB — TSH: TSH: 2.116 u[IU]/mL (ref 0.350–4.500)

## 2014-01-17 LAB — IPS PAP TEST WITH REFLEX TO HPV

## 2014-01-22 ENCOUNTER — Telehealth: Payer: Self-pay | Admitting: Emergency Medicine

## 2014-01-22 DIAGNOSIS — N950 Postmenopausal bleeding: Secondary | ICD-10-CM

## 2014-01-22 NOTE — Telephone Encounter (Signed)
Message left to return call to Kimberly Stevens at [phone].    

## 2014-01-22 NOTE — Telephone Encounter (Signed)
-----   Message from Lyman Speller, MD sent at 01/18/2014  9:08 AM EST ----- please inform pap is normal.  FSH is in menopausal rang so needs [facility] and endometrial biopsy.  Cmp, lipids and tsh were normal.

## 2014-01-23 ENCOUNTER — Ambulatory Visit: Payer: Managed Care, Other (non HMO) | Admitting: Certified Nurse Midwife

## 2014-01-23 ENCOUNTER — Telehealth: Payer: Self-pay | Admitting: *Deleted

## 2014-01-23 NOTE — Telephone Encounter (Signed)
-----   Message from Lyman Speller, MD sent at 01/18/2014  9:08 AM EST ----- please inform pap is normal.  FSH is in menopausal rang so needs [facility] and endometrial biopsy.  Cmp, lipids and tsh were normal.

## 2014-01-23 NOTE — Telephone Encounter (Signed)
Call to patient schedule consultation with Dr. Quincy Simmonds to discuss SUI issues. Appointment scheduled for 01/26/2014 at 1 PM. Urodynamic testing scheduled for 02/07/2014 at 2 PM pending appointment with Dr. Quincy Simmonds. Brief instruction regarding urodynamic testing given. Patient has PUS/SHGM/Endo BX with Dr. [name] Heck on 02/01/2014.

## 2014-01-23 NOTE — Telephone Encounter (Signed)
Spoke with patient.  Given message from Dr. [name] Heck. She is agreeable to scheduling procedures.  Patient scheduled for Sonohysterogram and endometrial biopsy for 02/01/14.   Pre-procedure instructions given. 800 mg motrin one hour before, Take with food. Make sure to eat a meal and drink fluids prior to appointment.    Patient advised would be contacted with out of pocket costs for procedures.   Patient verbalized understanding of the [facility] appointment cancellation policy. Advised will need to cancel or reschedule within 72 business hours of appointment (3 business days) or will have late cancellation fee placed to account $150.00 for Sonohysterogram.   Routing to provider for final review. Patient agreeable to disposition. Will close encounter   cc Felipa Emory

## 2014-01-26 ENCOUNTER — Encounter: Payer: Self-pay | Admitting: Obstetrics and Gynecology

## 2014-01-26 ENCOUNTER — Ambulatory Visit (INDEPENDENT_AMBULATORY_CARE_PROVIDER_SITE_OTHER): Payer: Managed Care, Other (non HMO) | Admitting: Obstetrics and Gynecology

## 2014-01-26 VITALS — BP 110/70 | HR 66 | Ht 63.5 in | Wt 137.2 lb

## 2014-01-26 DIAGNOSIS — N393 Stress incontinence (female) (male): Secondary | ICD-10-CM

## 2014-01-26 NOTE — Patient Instructions (Signed)
Please read your handouts on urinary incontinence.   I will be happy to help if you need it!

## 2014-01-26 NOTE — Progress Notes (Signed)
Patient ID: Kimberly Stevens, female   DOB: Oct 12, 1965, 49 y.o.   MRN: [id number] GYNECOLOGY VISIT  PCP:  Carolann Littler, MD  Referring provider: Edwinna Areola, MD  HPI: 49 y.o.   Married  Caucasian  female   G2P2002 with Patient's last menstrual period was 12/23/2013 (exact date).   here for stress urinary incontinence for 2 years, progressive?  Likes to run but leaks and runs down her leg even if she has just emptied her bladder.  Leaks also with sneezing, laughing, coughing, jumping. Wears a pad.   No leak for no reason.  Some frequency.  DF - every 2 -3 hours. NF - once a week. No enuresis.   History of frequent UTIs. Related to intercourse.   No hematuria or kidney stones.   Also dealing with recurrent vaginitis.  Enjoys swimming.   No fecal or gas incontinence.   No prior urologic evaluation or pelvic surgeries.  2 vaginal deliveries - largest 9 pounds, 2 ounces. Forceps delivery.   Having heavier menses.  Cannot wear tampons due to bladder urgency with use. Considering ablation.   GYNECOLOGIC HISTORY: Patient's last menstrual period was 12/23/2013 (exact date). Sexually active:  yes Partner preference: female Contraception:   vasectomy Menopausal hormone therapy: n/a DES exposure:  no Blood transfusions: no   Sexually transmitted diseases:  no  GYN procedures and prior surgeries:  none Last mammogram:  06-05-13 wnl:[facility]             Last pap and high risk HPV testing: 01-16-14 wnl:no HR HPV testing done.  HR HPV neg 11-15-12.   History of abnormal pap smear: no    OB History    Gravida Para Term Preterm AB TAB SAB Ectopic Multiple Living   2 2 2       2        LIFESTYLE: Exercise:  Weight training, swimming, walking and biking             Tobacco: no Alcohol:   2 drinks/weekend Drug use: no  Patient Active Problem List   Diagnosis Date Noted  . Right leg pain 12/21/2013  . Plantar fasciitis of left foot 07/20/2013  . Patellar  tendinitis of right knee 07/20/2013  . Left Achilles tendinitis 11/08/2012  . Pain in joint, pelvic region and thigh 03/08/2012  . 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 CELLULITIS AND ABSCESS OF UNSPECIFIED DIGIT 09/10/2009  . OTHER SYMPTOMS INVOLVING DIGESTIVE SYSTEM OTHER 07/25/2008  . ABDOMINAL PAIN -GENERALIZED 07/25/2008  . EPIGASTRIC PAIN 07/19/2008  . KNEE PAIN, RIGHT 06/08/2008  . TALIPES CAVUS 12/28/2007  . FOOT PAIN, CHRONIC 12/20/2007  . MUSCLE STRAIN, HAMSTRING MUSCLE 12/20/2007  . BENIGN NEOPLASM OF UNSPECIFIED SITE 12/13/2006  . URI 12/13/2006    Past Medical History  Diagnosis Date  . Dermoid cyst of ovary   . IBS (irritable bowel syndrome)   . Depression   . Polyp of duodenum 2014    tiny  . Fibrocystic breast   . Arthritis     left great toe joint  . Plantar fasciitis     left foot 2/15-11/15  . Tendonitis     right knee 2/15-11/15  . Eye disorder     like glaucoma-being followed by Dr Tyrone Schimke    Past Surgical History  Procedure Laterality Date  . Wisdom tooth extraction      Current Outpatient Prescriptions  Medication Sig Dispense Refill  . loratadine (CLARITIN) 10 MG tablet Take 10 mg  by mouth daily.    . nitrofurantoin, macrocrystal-monohydrate, (MACROBID) 100 MG capsule Take one capsule post coital as needed 30 capsule 1  . UNABLE TO FIND daily. Juice plus    . metroNIDAZOLE (METROGEL) 0.75 % gel Apply 1 application topically daily. (Patient not taking: Reported on 01/26/2014) 45 g 4   No current facility-administered medications for this visit.     ALLERGIES: Erythromycin ethylsuccinate and Penicillins  Family History  Problem Relation Age of Onset  . Leukemia Father     Myloma  . Diverticulitis Father   . Leukemia Maternal Uncle   . Colon cancer Neg Hx   . Colon polyps Neg Hx   . Arthritis Mother   . Arthritis Maternal Grandmother   . Arthritis Paternal Grandmother     History   Social History  . Marital  Status: Married    Spouse Name: N/A    Number of Children: N/A  . Years of Education: N/A   Occupational History  . Not on file.   Social History Main Topics  . Smoking status: Never Smoker   . Smokeless tobacco: Never Used  . Alcohol Use: 0.0 oz/week    0 Not specified per week     Comment: 2 on weekends  . Drug Use: No  . Sexual Activity:    Partners: Male     Comment: Vasectomy   Other Topics Concern  . Not on file   Social History Narrative    ROS:  Pertinent items are noted in HPI.  PHYSICAL EXAMINATION:    BP 110/70 mmHg  Pulse 66  Ht 5' 3.5" (1.613 m)  Wt 137 lb 3.2 oz (62.234 kg)  BMI 23.92 kg/m2  LMP 12/23/2013 (Exact Date)   Wt Readings from Last 3 Encounters:  01/26/14 137 lb 3.2 oz (62.234 kg)  01/16/14 137 lb 9.6 oz (62.415 kg)  12/19/13 138 lb (62.596 kg)     Ht Readings from Last 3 Encounters:  01/26/14 5' 3.5" (1.613 m)  01/16/14 5' 3.5" (1.613 m)  12/19/13 5\' 4"  (1.626 m)    General appearance: alert, cooperative and appears stated age  Abdomen: soft, non-tender; no masses,  no organomegaly   Pelvic: External genitalia:  no lesions              Urethra:  normal appearing urethra with no masses, tenderness or lesions              Bartholins and Skenes: normal                 Vagina: normal appearing vagina with normal color and discharge, no lesions              Cervix: normal appearance                 Bimanual Exam:  Uterus:  uterus is normal size, shape, consistency and nontender                                      Adnexa: normal adnexa in size, nontender and no masses                                      Rectovaginal: Confirms  Anus:  normal sphincter tone, no lesions  ASSESSMENT  Genuine stress incontinence. Post coital UTIs. Heavy menses.  PLAN  Discussed genuine stress incontinence - etiologies and treatment options - observation and Kegel's, pelvic floor therapy, midurethral slings.  I  discussed specifically slings - efficacy of 85% - 90% and potential risks of urinary retention, cystotomy, erosions and exposures, slower voiding, de novo or worsening urgency/frequency, and urinary tract infection.  Surgical procedure and recovery expectations discussed.  Patient given handouts on urinary incontinence and surgical treatment thereof.  She is leaning toward observational management at this time.  Using Nitrofurantoin currently post coitally.   25 minutes face to face time of which over 50% was spent in counseling.   An After Visit Summary was printed and given to the patient.

## 2014-01-29 NOTE — Telephone Encounter (Signed)
Patient wants to talk with the nurse first about her ultrasound and urodynamics appointment.

## 2014-01-29 NOTE — Telephone Encounter (Signed)
Spoke with patient.  She is calling for clarification of procedures to be performed 02/01/14.  Advised scheduled for Sonohysterogram and endometrial biopsy with Dr. [name] Heck 02/01/14. Patient not using ocp but husband with vasectomy.   Instructions given:   Motrin instructions given. Motrin=Advil=Ibuprofen Can take 800 mg (Can purchase over the counter, you will need four 200 mg pills). Take with food. Make sure to eat a meal and drink fluids prior to appointment.    Patient advised will need to have urine testing on 02/01/14 at appointment. Patient unsure if she will be keeping appointment for urodynamics as scheduled for 02/07/14. Advised to let our office know as soon as she can if she desires to cancel or r/s urodynamics.   Routing to provider for final review. Patient agreeable to disposition. Will close encounter

## 2014-01-29 NOTE — Telephone Encounter (Signed)
Message left to return call to Anissa Abbs at [phone].    

## 2014-02-01 ENCOUNTER — Telehealth: Payer: Self-pay | Admitting: *Deleted

## 2014-02-01 ENCOUNTER — Telehealth: Payer: Self-pay | Admitting: Obstetrics & Gynecology

## 2014-02-01 ENCOUNTER — Other Ambulatory Visit: Payer: Managed Care, Other (non HMO) | Admitting: Obstetrics & Gynecology

## 2014-02-01 ENCOUNTER — Other Ambulatory Visit: Payer: Managed Care, Other (non HMO)

## 2014-02-01 NOTE — Telephone Encounter (Signed)
Shgm/Emb needs to be reschedule due to emergency with ultrasound tech. Patient aware someone will call to rs. Patient last seen 01/16/14.

## 2014-02-01 NOTE — Telephone Encounter (Signed)
Call transferred to me to answer surgery questions.P Patient with several questions regarding recovery time for bladder repair procedure. Patient states she is not planning hysterectomy, only "bladder tuck."  Advised these are general guidelines only. Dr Quincy Simmonds will need to give specifics for her procedure once determined. Bladder procedures usually at least overnight hospital stay. No exercise for 6 weeks and no lifting for 12 weeks. Patient would like to know when she may resume walking/running. Advised may resume light walking after the first week with gradual return to normal routine. Again, no exercise for 6 weeks.  Will need to defer to Dr Quincy Simmonds on running, would this be 6 or 12 weeks? Is there any other information I should provide for patient?

## 2014-02-01 NOTE — Telephone Encounter (Signed)
Spoke with patient.  Patient with irregular cycles, uses vasectomy for birth control.   Patient scheduled for Sonohysterogram and endometrial biopsy for 02/22/14. Patient aware of instructions. Advised to call if on menses or any concerns prior.   Patient would like to cancel urodynamics appointment for now. She will call back when she would like to reschedule.   Routing to provider for final review. Patient agreeable to disposition. Will close encounter

## 2014-02-01 NOTE — Telephone Encounter (Signed)
Message left to return call to [place] at [phone] on mobile.   Patient needs to reschedule Sonohysterogram and endometrial biopsy.  Also, patient scheduled for urodynamics on 02/07/14, patient was unsure about keeping appointment, need to see if she would like to keep appointment. If so, needs instructions and UA scheduled for prior.

## 2014-02-01 NOTE — Telephone Encounter (Signed)
Left message to call Amarylis Rovito at [phone]. 

## 2014-02-02 NOTE — Telephone Encounter (Signed)
Patient returned call. Postoperative restrictions relayed to patient as directed by Dr. Quincy Simmonds. Patient states this information is quite helpful in helping her with decision. Encounter closed.

## 2014-02-02 NOTE — Telephone Encounter (Signed)
Call to patient. Left message to call back.  

## 2014-02-02 NOTE — Telephone Encounter (Signed)
Please inform patient about time out of work may need to be 2 weeks.  Patient may need a catheter or be doing catheterization during the first 2 weeks, rarely longer.  No running, true exercise, sexual activity, or lifting over 10 pounds for 8 weeks following midurethral sling if this is the only procedure done at the time of surgery.

## 2014-02-07 ENCOUNTER — Other Ambulatory Visit: Payer: Self-pay | Admitting: Certified Nurse Midwife

## 2014-02-07 ENCOUNTER — Ambulatory Visit: Payer: Managed Care, Other (non HMO)

## 2014-02-07 NOTE — Telephone Encounter (Signed)
Medication refill request: Pyridium 200 mg; Diflucan 150 mg  Last AEX:  01/16/14 with Dr. [name] Heck Next AEX: 02/07/15 with Dr. [name] Heck  Last MMG (if hormonal medication request): N/A Refill authorized: Please advise.

## 2014-02-19 ENCOUNTER — Telehealth: Payer: Self-pay | Admitting: Obstetrics & Gynecology

## 2014-02-19 NOTE — Telephone Encounter (Signed)
Patient is asking to reschedule her upcoming SHGM/EMB. Patient has started her cycle and wants to reschedule.

## 2014-02-19 NOTE — Telephone Encounter (Signed)
FYI: see attached tc note, sent to Sabrina to reschedule

## 2014-02-20 NOTE — Telephone Encounter (Signed)
Dr.Silva, patient started cycle 2/1 and was scheduled for [facility] on 2/4. Patient's husband has had a vasectomy. Okay to schedule patient at next available [facility] slot with you as long as she is not on cycle? Next available is 03/15/2014.

## 2014-02-20 NOTE — Telephone Encounter (Signed)
Ok to do when she is not on her cycle.  Will do UPT the day of procedure prior to having study done.

## 2014-02-21 NOTE — Telephone Encounter (Signed)
Left message to call Kimberly Stevens at [phone]. 

## 2014-02-22 ENCOUNTER — Other Ambulatory Visit: Payer: Managed Care, Other (non HMO) | Admitting: Obstetrics & Gynecology

## 2014-02-22 ENCOUNTER — Other Ambulatory Visit: Payer: Managed Care, Other (non HMO)

## 2014-03-01 NOTE — Telephone Encounter (Signed)
Attempted to reach patient at number provided [phone]. Line was busy. Will try again later.

## 2014-03-06 NOTE — Telephone Encounter (Signed)
Left message to call Debbora Ang at [phone]. 

## 2014-03-09 NOTE — Telephone Encounter (Signed)
Letter sent to patient.

## 2014-03-09 NOTE — Telephone Encounter (Signed)
Thank you for sending this patient the letter.

## 2014-03-09 NOTE — Telephone Encounter (Signed)
Dr.Silva, I have been unable to reach this patient x3. Would you like me to send patient a letter at this time regarding scheduling appointment?

## 2014-03-09 NOTE — Telephone Encounter (Signed)
Please send a letter. Thank you

## 2014-03-14 NOTE — Telephone Encounter (Signed)
Patient calling to schedule ultrasound.

## 2014-03-14 NOTE — Telephone Encounter (Signed)
Spoke with patient. SHGM/EMB rescheduled to 3/10 at 2pm with 2:30pm consult with Dr.Miller. Patient is agreeable to date and time. Patient verbalized understanding of the [facility] appointment cancellation policy. Advised will need to cancel or reschedule within 72 business hours of appointment (3 business days) or will have $100.00 late cancellation fee placed to account. $150.00 for Sonohysterogram. Advised patient to take 800 mg of motrin/ibuprofen one hour before appointment and to eat and drink plenty of fluids. Patient is agreeable.  Routing to provider for final review. Patient agreeable to disposition. Will close encounter

## 2014-03-29 ENCOUNTER — Ambulatory Visit (INDEPENDENT_AMBULATORY_CARE_PROVIDER_SITE_OTHER): Payer: Managed Care, Other (non HMO) | Admitting: Obstetrics & Gynecology

## 2014-03-29 ENCOUNTER — Ambulatory Visit (INDEPENDENT_AMBULATORY_CARE_PROVIDER_SITE_OTHER): Payer: Managed Care, Other (non HMO)

## 2014-03-29 ENCOUNTER — Other Ambulatory Visit: Payer: Self-pay | Admitting: Obstetrics & Gynecology

## 2014-03-29 VITALS — BP 104/70 | Wt 139.0 lb

## 2014-03-29 DIAGNOSIS — N950 Postmenopausal bleeding: Secondary | ICD-10-CM

## 2014-03-29 DIAGNOSIS — R938 Abnormal findings on diagnostic imaging of other specified body structures: Secondary | ICD-10-CM

## 2014-03-29 DIAGNOSIS — R9389 Abnormal findings on diagnostic imaging of other specified body structures: Secondary | ICD-10-CM

## 2014-03-29 NOTE — Progress Notes (Signed)
49 y.o. G2P2 Married Kimberly Stevens female here for a pelvic ultrasound due to irregular bleeding that she has been experiencing for several months.  Pt seen for AEX 01/16/14 and after discussion of irregular bleeding, an [facility] was performed.  The result was 58.5.  Advised pt with this finding, she really should not be bleeding at all and [facility] with endometrial biopsy was recommended.  She is here for this today.  Pt states this doesn't make sense to her as she is not having any hot flashes or night sweats and just doesn't feel menopausal.    Contraception: vasectomy  Technique:  Both transabdominal and transvaginal ultrasound examinations of the pelvis were performed. Transabdominal technique was performed for global imaging of the pelvis including uterus, ovaries, adnexal regions, and pelvic cul-de-sac.  It was necessary to proceed with endovaginal exam following the abdominal ultrasound transabdominal exam to visualize the endometrium and adnexa.  Color and duplex Doppler ultrasound was utilized to evaluate blood flow to the ovaries.   FINDINGS: Uterus: 7.4 x 4.6 x 3.7cm Endometrium: 32mm Adnexa:  Left: 1.5 x 0.8 x 0.9cm     Right: 2.3 x 1.3 x 1.0cm Cul de sac: no free fluid  SHSG:  After obtaining appropriate verbal consent from patient, the cervix was visualized using a speculum, and prepped with betadine.  A tenaculum  was applied to the cervix.  Dilation of the cervix was necessary. The catheter was passed into the uterus and sterile saline introduced, with the following findings:  No filling defects were noted.  Endometrial biopsy recommended. Verbal and written consent obtained.  Speculum placed.  Cervix visualized and cleansed with betadine prep.  A single toothed tenaculum was applied to the anterior lip of the cervix.  Endometrial pipelle was advanced through the cervix into the endometrial cavity without difficulty.  Pipelle passed to 7cm.  Suction applied and pipelle removed with good tissue sample  obtained.  Tenculum removed.  No bleeding noted.  Patient tolerated procedure well.  All instruments removed.  Assessment:  PMP bleeding with [place] 58 drawn 01/16/14 Pt does not "feel" menopausal  Plan:  Repeat FSH today Endometrial biopsy pending.  Results will be called to pt.  ~15 minutes spent with patient >50% of time was in face to face discussion of above.

## 2014-04-02 ENCOUNTER — Encounter: Payer: Self-pay | Admitting: Obstetrics & Gynecology

## 2014-04-03 LAB — FOLLICLE STIMULATING HORMONE: FSH: 27.7 m[IU]/mL

## 2014-04-05 ENCOUNTER — Telehealth: Payer: Self-pay | Admitting: Emergency Medicine

## 2014-04-05 NOTE — Telephone Encounter (Signed)
Message left to return call to Stevin Bielinski at [phone].    

## 2014-04-05 NOTE — Telephone Encounter (Signed)
-----   Message from Megan Salon, MD sent at 04/04/2014  1:41 PM EDT ----- Please inform pt her biopsy was negative for abnormal cells.  Her [place] is lower.  She is not fully menopausal but may have irregular cycles.  If skips cycles for more than 3 months or if she has any heavy bleeding, she needs to call.

## 2014-04-05 NOTE — Telephone Encounter (Signed)
Spoke with patient and message from Dr. [name] Heck given. Patient verbalizes understanding and will follow up as needed.  Routing to provider for final review. Patient agreeable to disposition. Will close encounter

## 2014-04-30 ENCOUNTER — Other Ambulatory Visit: Payer: Self-pay

## 2014-04-30 DIAGNOSIS — Z1231 Encounter for screening mammogram for malignant neoplasm of breast: Secondary | ICD-10-CM

## 2014-05-07 ENCOUNTER — Other Ambulatory Visit: Payer: Self-pay | Admitting: Obstetrics & Gynecology

## 2014-05-07 NOTE — Telephone Encounter (Signed)
Spoke with patient. Reports two days of vaginal itching and opaque to white vaginal discharge. Patient has been swimming and wearing wet bathing suits also, wears spandex for length of time for workouts. She feels that she has had this in the past and was given treatment. Advised patient she has hx of bacterial vaginosis and without an office visit unable to know if yeast vs BV.   Patient declines appointment for evaluation. She states she will try OTC Monistat prior to scheduling an office visit.   Recommended  Monistat 3 or 7 day treatment (OTC) and Hydrocortisone ointment for external irritation. Advised to keep area clean and dry. Change Damp clothes as soon as possible. White cotton underwear is best.  Patient advised to return call if no relief from OTC treatment. Patient agreeable.

## 2014-05-07 NOTE — Telephone Encounter (Signed)
Patient is requesting a refill for a yeast infection. She states Dr. [name] Heck has an standing order in her chart for this because she is a swimmer and gets yeast infections often. Patient is using  [facility] @ [address]

## 2014-05-07 NOTE — Telephone Encounter (Signed)
Medication refill request: Diflucan  Last AEX:  01/16/14 SM Next AEX: 02/07/15 SM Last MMG (if hormonal medication request): 06/05/13 [id number]:Neg Refill authorized: 02/08/14 #[id number]/[email or web address]. Today please advise.

## 2014-05-07 NOTE — Telephone Encounter (Signed)
Request for Diflucan came through.  Please call pt and assess to see if needs appt.  Rx denied for now.  CC: Kimberly Stevens for [employer]

## 2014-05-10 ENCOUNTER — Encounter: Payer: Self-pay | Admitting: Sports Medicine

## 2014-05-10 ENCOUNTER — Ambulatory Visit (INDEPENDENT_AMBULATORY_CARE_PROVIDER_SITE_OTHER): Payer: Managed Care, Other (non HMO) | Admitting: Sports Medicine

## 2014-05-10 VITALS — BP 102/41 | Ht 64.0 in | Wt 136.0 lb

## 2014-05-10 DIAGNOSIS — S46302A Unspecified injury of muscle, fascia and tendon of triceps, left arm, initial encounter: Secondary | ICD-10-CM

## 2014-05-10 DIAGNOSIS — M7712 Lateral epicondylitis, left elbow: Secondary | ICD-10-CM | POA: Insufficient documentation

## 2014-05-10 DIAGNOSIS — M1990 Unspecified osteoarthritis, unspecified site: Secondary | ICD-10-CM

## 2014-05-10 DIAGNOSIS — M25522 Pain in left elbow: Secondary | ICD-10-CM | POA: Diagnosis not present

## 2014-05-10 DIAGNOSIS — M19079 Primary osteoarthritis, unspecified ankle and foot: Secondary | ICD-10-CM

## 2014-05-10 MED ORDER — NITROGLYCERIN 0.2 MG/HR TD PT24: MEDICATED_PATCH | TRANSDERMAL | Status: DC

## 2014-05-10 NOTE — Patient Instructions (Signed)
Try voltaren gel - up to 3 to 4 times per day If this helps that is a good therapy OK to use some ibuprofen You have arthritis in that first toe joint  You have a tear in your triceps Use elbow sleeve for exercise  Start easy tricep curls - 3 sets of 15 with 1b weight Start low and elevate 3 positions It is more than 1 cm long so we need to try to heal this with NTG  Repeat the scan in 4 to 6 weeks

## 2014-05-10 NOTE — Assessment & Plan Note (Addendum)
Ultrasound significant for partial rupture of triceps tendon medial fascicle. Mild swelling seen on ultrasound as well with hypoechoic change at MT jxn extending to humerus. No effusion and nl joint space. No pathology seen in elbow joint  Elbow sleeve  Exercises per AVS  No weight bearing exercises on left arm  Re-image in 4-6 weeks  NTG protocol

## 2014-05-10 NOTE — Progress Notes (Signed)
  Kimberly Stevens - 49 y.o. female MRN [id number]  Date of birth: [birth year]    SUBJECTIVE:     Patient presents with a 6 week history of worsened left great toe pain. She has a chronic history of pain caused by arthritis. She has noticed increase in pain without any eliciting event. Pain is worse at night but is also present during the day. She has been walking more and walking less than before, but this is more related to plantar fasciitis.   She also presents with a 1.5 week history of left elbow pain. She has started a weight training class that has her performing other exercises, such as burpies. She noticed, after carrying a couple of 15lb dumbells pain in her left post elbow, that she developed a black streak along the medial aspect of her forearm extending past her elbow joint to mid arm. She developed worsening pain that was located on the dorsal aspect of her left elbow. She is unsure of specific movements that exacerbate symptoms, but does recall pain worsening while walking her dog with that same arm.  ROS:    Musculoskeletal: L. Elbow pain. +echymosis. L. MTP pain. Limited ROM of L MTP  PERTINENT  PMH / PSH FH / / SH:  Past Medical, Surgical, Social, and Family History Reviewed & Updated in the EMR.  Pertinent findings include:  Arthritis of first MTP Plantar fasciitis of left foot   OBJECTIVE: BP 102/41 mmHg  Ht 5\' 4"  (1.626 m)  Wt 136 lb (61.689 kg)  BMI 23.33 kg/m2  Physical Exam:  Vital signs are reviewed. General: Well appearing, no distress Musculoskeletal:  Left elbow: no swelling, no deformities, no erythema. Tenderness over distal end of triceps tendon. No tenderness over olecranon, medial or lateral condlyesi. Full ROM. 5/5 extension/flexion.   Left great toe: No swelling or redness. 10 degrees of extension and 20 degrees of flexion/ comparee to 45 and 30 on RT/. 5/5 strength. Slight tenderness at insertion point of plantar fascia.  ASSESSMENT & PLAN:  See problem  based charting & AVS for pt instructions.

## 2014-05-10 NOTE — Assessment & Plan Note (Addendum)
Symptoms acutely worsened. Patient not using orthotics and not using meds  Restart topical volataren and warm soaks  Strausberg sock nightly ofr pressure at PF  Voltaren gel 3-4 times daily  Avoid narrow shoes

## 2014-05-23 ENCOUNTER — Other Ambulatory Visit: Payer: Managed Care, Other (non HMO) | Admitting: Sports Medicine

## 2014-05-24 ENCOUNTER — Encounter: Payer: Self-pay | Admitting: Sports Medicine

## 2014-05-24 ENCOUNTER — Ambulatory Visit (INDEPENDENT_AMBULATORY_CARE_PROVIDER_SITE_OTHER): Payer: Managed Care, Other (non HMO) | Admitting: Sports Medicine

## 2014-05-24 VITALS — BP 116/64 | Ht 64.0 in | Wt 136.0 lb

## 2014-05-24 DIAGNOSIS — S46302A Unspecified injury of muscle, fascia and tendon of triceps, left arm, initial encounter: Secondary | ICD-10-CM

## 2014-05-24 NOTE — Assessment & Plan Note (Signed)
Ultrasound of the left triceps reveals the area of hypoechoic swelling is only 30% as large as before The medial triceps tendon has a very small disruption but most fibers are intact No joint effusion noted  Scan of the shoulder revealed no abnormalities of the biceps or infraspinatus both of the areas where she felt some tightness  Continue on nitroglycerin protocol Continue compression sleeve Progress her home exercise program to using 3 pounds Recheck 1 months

## 2014-05-24 NOTE — Progress Notes (Signed)
Patient ID: Kimberly Stevens, female   DOB: 20-Oct-1965, 49 y.o.   MRN: [id number]  BP 116/64 mmHg  Ht 5\' 4"  (1.626 m)  Wt 136 lb (61.689 kg)  BMI 23.33 kg/m2  History: 2 days ago on lower body workout she felt a pull in the quad to the groin, ASIS area RT She used compression and icing After 48 hours the pain is more than 50% improved She still feels some tightness and tenderness in the upper thigh and just above the right knee  Left triceps generally feels much better She continues very light weight rehabilitation and she uses a medium compression sleeve during the day and a small when working out She has avoided swimming She does notice some slight shoulder pain and wonders if she's compensating for her elbow  Exam: Athletic female no acute distress BP 116/64 mmHg  Ht 5\' 4"  (1.626 m)  Wt 136 lb (61.689 kg)  BMI 23.33 kg/m2  Hip flexor is strong RT Quad is strong but had tightness distally Adductor is strong but creates pain When tested No discoloration No muscular defects Palpation of the proximal quadriceps tendon and the proximal adductor tendon are both slightly painful  Triceps area shows no bruising or swelling on the left Full range of motion of the elbow No real tenderness   Impression I think she has a grade 1 strain of her rectus femoris in her common abductor muscles of the right leg This occurred secondary to box jumps  Followup of triceps partial tear  Plan Compression Straight leg raises Relative rest for at least 2 weeks If the quadriceps feels better after this she can gradually resume activity  Triceps-see problem base chart

## 2014-05-24 NOTE — Patient Instructions (Signed)
Use compression on your thigh for the next 3 months Do straight leg raises to level of pain, 2-3 sets of 15 Continue on the nitroglycerin for your elbow Ease into the tricep exercises using 1-3 lbs, along with the bicepts Can walk and swim as tolerated

## 2014-06-08 ENCOUNTER — Ambulatory Visit
Admission: RE | Admit: 2014-06-08 | Discharge: 2014-06-08 | Disposition: A | Discharge status: Home / Self Care | Payer: Managed Care, Other (non HMO) | Source: Ambulatory Visit

## 2014-06-08 DIAGNOSIS — Z1231 Encounter for screening mammogram for malignant neoplasm of breast: Secondary | ICD-10-CM

## 2014-06-12 ENCOUNTER — Encounter: Payer: Self-pay | Admitting: Sports Medicine

## 2014-06-12 ENCOUNTER — Ambulatory Visit (INDEPENDENT_AMBULATORY_CARE_PROVIDER_SITE_OTHER): Payer: Managed Care, Other (non HMO) | Admitting: Sports Medicine

## 2014-06-12 VITALS — BP 124/81 | Ht 64.0 in | Wt 136.0 lb

## 2014-06-12 DIAGNOSIS — S46302A Unspecified injury of muscle, fascia and tendon of triceps, left arm, initial encounter: Secondary | ICD-10-CM

## 2014-06-12 NOTE — Progress Notes (Signed)
Patient ID: Kimberly Stevens, female   DOB: 12-17-1965, 49 y.o.   MRN: [id number]   BP 124/81 mmHg  Ht 5\' 4"  (1.626 m)  Wt 136 lb (61.689 kg)  BMI 23.33 kg/m2  LMP 05/25/2014   History: Left elbow pain  We have been following for a triceps tear on the medial side of the elbow Wearing the sleeve has been bothering her so she has limited the use of it Using the nitro with good results as this has really showed good pain relief  She has tried to continue swimming and now is having pain at the medial epicondyle of the left elbow This bothers her with crawl stroke  Pain overall is less but if she tries to lift something with the left arm it is painful  No new swelling but after swimming she had redness around the elbow  Examination No acute distress BP 124/81 mmHg  Ht 5\' 4"  (1.626 m)  Wt 136 lb (61.689 kg)  BMI 23.33 kg/m2  LMP 05/25/2014  Full range of motion of the left elbow  Only slight tenderness at the triceps insertion in the midline but still quite tender on the medial aspect Slight tenderness at the medial epicondyles Elbow flexion causes no pain Wrist and finger flexion does cause pain at the medial epicondyle Resistance did not cause pain at the triceps   Ultrasound of left elbow Ultrasound showed less swelling medial triceps The area of a retracted tear seems to be smaller  There appear to be new musculotendinous fibers bridging the area Medial epicondyle does not look remarkable on ultrasound

## 2014-06-12 NOTE — Assessment & Plan Note (Signed)
This continues to show some improvement both on testing and ultrasound Keep up the nitroglycerin as this seems to help Compression if helpful but if uncomfortable she does not have to use Easy rehabilitation exercises  I think she developed some medial epicondylitis from compensating while trying to swim I suggested we can eliminate swimming anymore stressful exercises for the left arm until we recheck this

## 2014-06-13 ENCOUNTER — Other Ambulatory Visit: Payer: Managed Care, Other (non HMO) | Admitting: Sports Medicine

## 2014-06-20 ENCOUNTER — Encounter (HOSPITAL_COMMUNITY): Payer: Self-pay | Admitting: *Deleted

## 2014-06-20 ENCOUNTER — Telehealth: Payer: Self-pay | Admitting: Internal Medicine

## 2014-06-20 ENCOUNTER — Emergency Department (HOSPITAL_COMMUNITY)
Admission: EM | Admit: 2014-06-20 | Discharge: 2014-06-20 | Disposition: A | Discharge status: Home / Self Care | Payer: Managed Care, Other (non HMO) | Attending: Emergency Medicine | Admitting: Emergency Medicine

## 2014-06-20 ENCOUNTER — Telehealth: Payer: Self-pay | Admitting: Family Medicine

## 2014-06-20 ENCOUNTER — Emergency Department (HOSPITAL_COMMUNITY): Payer: Managed Care, Other (non HMO)

## 2014-06-20 DIAGNOSIS — F329 Major depressive disorder, single episode, unspecified: Secondary | ICD-10-CM | POA: Diagnosis not present

## 2014-06-20 DIAGNOSIS — Z79899 Other long term (current) drug therapy: Secondary | ICD-10-CM | POA: Insufficient documentation

## 2014-06-20 DIAGNOSIS — M19072 Primary osteoarthritis, left ankle and foot: Secondary | ICD-10-CM | POA: Insufficient documentation

## 2014-06-20 DIAGNOSIS — R1031 Right lower quadrant pain: Secondary | ICD-10-CM | POA: Diagnosis present

## 2014-06-20 DIAGNOSIS — R1033 Periumbilical pain: Secondary | ICD-10-CM | POA: Diagnosis not present

## 2014-06-20 DIAGNOSIS — R110 Nausea: Secondary | ICD-10-CM | POA: Insufficient documentation

## 2014-06-20 DIAGNOSIS — Z86018 Personal history of other benign neoplasm: Secondary | ICD-10-CM | POA: Diagnosis not present

## 2014-06-20 DIAGNOSIS — Z8742 Personal history of other diseases of the female genital tract: Secondary | ICD-10-CM | POA: Diagnosis not present

## 2014-06-20 DIAGNOSIS — Z880 Allergy status to penicillin: Secondary | ICD-10-CM | POA: Insufficient documentation

## 2014-06-20 DIAGNOSIS — R630 Anorexia: Secondary | ICD-10-CM | POA: Insufficient documentation

## 2014-06-20 DIAGNOSIS — Z8719 Personal history of other diseases of the digestive system: Secondary | ICD-10-CM | POA: Insufficient documentation

## 2014-06-20 LAB — COMPREHENSIVE METABOLIC PANEL
ALBUMIN: 4.3 g/dL (ref 3.5–5.0)
ALT: 17 U/L (ref 14–54)
AST: 21 U/L (ref 15–41)
Alkaline Phosphatase: 53 U/L (ref 38–126)
Anion gap: 9 (ref 5–15)
BILIRUBIN TOTAL: 0.5 mg/dL (ref 0.3–1.2)
BUN: 9 mg/dL (ref 6–20)
CHLORIDE: 101 mmol/L (ref 101–111)
CO2: 27 mmol/L (ref 22–32)
CREATININE: 0.95 mg/dL (ref 0.44–1.00)
Calcium: 9.6 mg/dL (ref 8.9–10.3)
GFR calc non Af Amer: >60 mL/min (ref >60)
Glucose, Bld: 99 mg/dL (ref 65–99)
POTASSIUM: 3.7 mmol/L (ref 3.5–5.1)
SODIUM: 137 mmol/L (ref 135–145)
TOTAL PROTEIN: 7.3 g/dL (ref 6.5–8.1)

## 2014-06-20 LAB — URINALYSIS, ROUTINE W REFLEX MICROSCOPIC
Bilirubin Urine: NEGATIVE
GLUCOSE, UA: NEGATIVE mg/dL
HGB URINE DIPSTICK: NEGATIVE
KETONES UR: 15 mg/dL — AB
Leukocytes, UA: NEGATIVE
NITRITE: NEGATIVE
PROTEIN: NEGATIVE mg/dL
Specific Gravity, Urine: 1.025 (ref 1.005–1.030)
UROBILINOGEN UA: 0.2 mg/dL (ref 0.0–1.0)
pH: 6 (ref 5.0–8.0)

## 2014-06-20 LAB — LIPASE, BLOOD: Lipase: 23 U/L (ref 22–51)

## 2014-06-20 LAB — CBC WITH DIFFERENTIAL/PLATELET
Basophils Absolute: 0.1 10*3/uL (ref 0.0–0.1)
Basophils Relative: 0 % (ref 0–1)
Eosinophils Absolute: 0.1 10*3/uL (ref 0.0–0.7)
Eosinophils Relative: 1 % (ref 0–5)
HCT: 43.4 % (ref 36.0–46.0)
Hemoglobin: 15.2 g/dL — ABNORMAL HIGH (ref 12.0–15.0)
LYMPHS PCT: 7 % — AB (ref 12–46)
Lymphs Abs: 1.1 10*3/uL (ref 0.7–4.0)
MCH: 33.2 pg (ref 26.0–34.0)
MCHC: 35 g/dL (ref 30.0–36.0)
MCV: 94.8 fL (ref 78.0–100.0)
MONO ABS: 0.8 10*3/uL (ref 0.1–1.0)
Monocytes Relative: 5 % (ref 3–12)
NEUTROS ABS: 13.8 10*3/uL — AB (ref 1.7–7.7)
Neutrophils Relative %: 87 % — ABNORMAL HIGH (ref 43–77)
PLATELETS: 218 10*3/uL (ref 150–400)
RBC: 4.58 MIL/uL (ref 3.87–5.11)
RDW: 12 % (ref 11.5–15.5)
WBC: 15.8 10*3/uL — ABNORMAL HIGH (ref 4.0–10.5)

## 2014-06-20 LAB — I-STAT BETA HCG BLOOD, ED (MC, WL, AP ONLY)

## 2014-06-20 MED ORDER — OXYCODONE-ACETAMINOPHEN 5-325 MG PO TABS: 1.0000 | ORAL_TABLET | Freq: Four times a day (QID) | ORAL | Status: AC | PRN

## 2014-06-20 MED ORDER — MORPHINE SULFATE 4 MG/ML IJ SOLN
4.0000 mg | Freq: Once | INTRAMUSCULAR | Status: AC
  Administered 2014-06-20: 4 mg via INTRAVENOUS
  Filled 2014-06-20: qty 1

## 2014-06-20 MED ORDER — SODIUM CHLORIDE 0.9 % IV BOLUS (SEPSIS)
1000.0000 mL | Freq: Once | INTRAVENOUS | Status: AC
  Administered 2014-06-20: 1000 mL via INTRAVENOUS

## 2014-06-20 MED ORDER — IOHEXOL 300 MG/ML  SOLN
100.0000 mL | Freq: Once | INTRAMUSCULAR | Status: AC | PRN
  Administered 2014-06-20: 100 mL via INTRAVENOUS

## 2014-06-20 MED ORDER — ONDANSETRON 4 MG PO TBDP: 4.0000 mg | ORAL_TABLET | Freq: Three times a day (TID) | ORAL | Status: AC | PRN

## 2014-06-20 MED ORDER — IOHEXOL 300 MG/ML  SOLN
25.0000 mL | Freq: Once | INTRAMUSCULAR | Status: AC | PRN
  Administered 2014-06-20: 25 mL via ORAL

## 2014-06-20 NOTE — ED Provider Notes (Signed)
CSN: [id number]     Arrival date & time 06/20/14  1700 History   First MD Initiated Contact with Patient 06/20/14 1804     Chief Complaint  Patient presents with  . Abdominal Pain     (Consider location/radiation/quality/duration/timing/severity/associated sxs/prior Treatment) Patient is a 49 y.o. female presenting with abdominal pain. The history is provided by the patient.  Abdominal Pain Pain location:  Periumbilical Pain quality: sharp   Pain radiates to:  Does not radiate Pain severity:  Moderate Onset quality:  Gradual Duration:  6 hours Timing:  Constant Progression:  Worsening Chronicity:  New Context: not previous surgeries, not recent illness, not sick contacts, not suspicious food intake and not trauma   Relieved by:  Nothing Worsened by:  Nothing tried Ineffective treatments:  None tried Associated symptoms: anorexia and nausea   Associated symptoms: no chest pain, no chills, no constipation, no cough, no diarrhea, no dysuria, no fatigue, no fever, no hematemesis, no hematochezia, no hematuria, no melena, no shortness of breath, no vaginal bleeding, no vaginal discharge and no vomiting     Past Medical History  Diagnosis Date  . Dermoid cyst of ovary   . IBS (irritable bowel syndrome)   . Depression   . Polyp of duodenum 2014    tiny  . Fibrocystic breast   . Arthritis     left great toe joint  . Plantar fasciitis     left foot 2/15-11/15  . Tendonitis     right knee 2/15-11/15  . Eye disorder     like glaucoma-being followed by Dr Tyrone Schimke   Past Surgical History  Procedure Laterality Date  . Wisdom tooth extraction     Family History  Problem Relation Age of Onset  . Leukemia Father     Myloma  . Diverticulitis Father   . Leukemia Maternal Uncle   . Colon cancer Neg Hx   . Colon polyps Neg Hx   . Arthritis Mother   . Arthritis Maternal Grandmother   . Arthritis Paternal Grandmother    History  Substance Use Topics  . Smoking status: Never  Smoker   . Smokeless tobacco: Never Used  . Alcohol Use: 0.0 oz/week    0 Standard drinks or equivalent per week     Comment: 2 on weekends   OB History    Gravida Para Term Preterm AB TAB SAB Ectopic Multiple Living   2 2 2       2      Review of Systems  Constitutional: Positive for appetite change. Negative for fever, chills, diaphoresis and fatigue.  Respiratory: Negative for cough, chest tightness and shortness of breath.   Cardiovascular: Negative for chest pain, palpitations and leg swelling.  Gastrointestinal: Positive for nausea, abdominal pain and anorexia. Negative for vomiting, diarrhea, constipation, blood in stool, melena, hematochezia, abdominal distention and hematemesis.  Genitourinary: Negative for dysuria, frequency, hematuria, flank pain, vaginal bleeding, vaginal discharge and difficulty urinating.  Musculoskeletal: Negative for back pain, neck pain and neck stiffness.  Skin: Negative for color change, pallor and rash.  Neurological: Negative for dizziness, light-headedness and headaches.  All other systems reviewed and are negative.     Allergies  Erythromycin ethylsuccinate and Penicillins  Home Medications   Prior to Admission medications   Medication Sig Start Date End Date Taking? Authorizing Provider  calcium carbonate (TUMS EX) 750 MG chewable tablet Chew 1 tablet by mouth daily as needed for heartburn.   Yes Historical Provider, MD  ibuprofen (ADVIL,MOTRIN) 200 MG  tablet Take 200 mg by mouth every 6 (six) hours as needed for mild pain or moderate pain.   Yes Historical Provider, MD  lactase (LACTAID) 3000 UNITS tablet Take 3,000 Units by mouth daily as needed (lactose intolerant).   Yes Historical Provider, MD  loratadine (CLARITIN) 10 MG tablet Take 10 mg by mouth daily as needed for allergies or rhinitis.    Yes Historical Provider, MD  metroNIDAZOLE (METROGEL) 0.75 % gel Apply 1 application topically daily. Patient taking differently: Apply 1  application topically at bedtime.  01/16/14  Yes Megan Salon, MD  nitrofurantoin, macrocrystal-monohydrate, (MACROBID) 100 MG capsule Take one capsule post coital as needed Patient taking differently: Take 100 mg by mouth as needed (uti).  02/21/13  Yes Regina Eck, CNM  nitroGLYCERIN (NITRODUR - DOSED IN MG/24 HR) 0.2 mg/hr patch 1/4 patch daily for 24 hours as directed 05/10/14  Yes Stefanie Libel, MD  Nutritional Supplements (JUICE PLUS FIBRE PO) Take 1 tablet by mouth daily.   Yes Historical Provider, MD  fluconazole (DIFLUCAN) 150 MG tablet Take 1 tablet (150 mg total) by mouth once. Repeat in 48 hours Patient not taking: Reported on 06/20/2014 02/08/14   Megan Salon, MD  ondansetron (ZOFRAN ODT) 4 MG disintegrating tablet Take 1 tablet (4 mg total) by mouth every 8 (eight) hours as needed for nausea or vomiting. 06/20/14 06/22/14  Ellwood Dense, MD  oxyCODONE-acetaminophen (PERCOCET/ROXICET) 5-325 MG per tablet Take 1-2 tablets by mouth every 6 (six) hours as needed for severe pain. 06/20/14 06/22/14  Ellwood Dense, MD  phenazopyridine (PYRIDIUM) 200 MG tablet TAKE ONE TABLET BY MOUTH THREE TIMES DAILY AS NEEDED FOR PAIN  Patient not taking: Reported on 06/20/2014 02/08/14   Megan Salon, MD   BP 105/56 mmHg  Pulse 77  Temp(Src) 98 F (36.7 C) (Oral)  Resp 18  SpO2 97%  LMP 05/25/2014 Physical Exam  Constitutional: She is oriented to person, place, and time. She appears well-developed and well-nourished. No distress.  HENT:  Head: Normocephalic and atraumatic.  Mouth/Throat: Oropharynx is clear and moist.  Eyes: Conjunctivae and EOM are normal. Pupils are equal, round, and reactive to light.  Neck: Normal range of motion. Neck supple.  Cardiovascular: Normal rate, normal heart sounds and intact distal pulses.  Exam reveals no gallop and no friction rub.   No murmur heard. Pulmonary/Chest: Effort normal and breath sounds normal. No respiratory distress. She has no wheezes. She has no rales.   Abdominal: Soft. Normal appearance and bowel sounds are normal. She exhibits no distension. There is tenderness in the right lower quadrant and periumbilical area. There is no rigidity, no rebound, no guarding, no CVA tenderness, no tenderness at McBurney's point and negative Murphy's sign.  Musculoskeletal: Normal range of motion. She exhibits no edema or tenderness.  Neurological: She is alert and oriented to person, place, and time. GCS eye subscore is 4. GCS verbal subscore is 5. GCS motor subscore is 6.  Skin: Skin is warm and dry. No rash noted. She is not diaphoretic. No erythema. No pallor.  Nursing note and vitals reviewed.   ED Course  Procedures (including critical care time) Labs Review Labs Reviewed  CBC WITH DIFFERENTIAL/PLATELET - Abnormal; Notable for the following:    WBC 15.8 (*)    Hemoglobin 15.2 (*)    Neutrophils Relative % 87 (*)    Neutro Abs 13.8 (*)    Lymphocytes Relative 7 (*)    All other components within normal limits  URINALYSIS, ROUTINE W REFLEX MICROSCOPIC (NOT AT [facility]) - Abnormal; Notable for the following:    Ketones, ur 15 (*)    All other components within normal limits  COMPREHENSIVE METABOLIC PANEL  LIPASE, BLOOD  I-STAT BETA HCG BLOOD, ED ([facility], [facility], [facility] ONLY)    Imaging Review Ct Abdomen Pelvis W Contrast  06/20/2014   CLINICAL DATA:  Acute onset periumbilical abdominal pain today. Nausea and anorexia. Chills.  EXAM: CT ABDOMEN AND PELVIS WITH CONTRAST  TECHNIQUE: Multidetector CT imaging of the abdomen and pelvis was performed using the standard protocol following bolus administration of intravenous contrast.  CONTRAST:  120mL OMNIPAQUE IOHEXOL 300 MG/ML  SOLN  COMPARISON:  07/20/2008  FINDINGS: Lower Chest:  Unremarkable.  Hepatobiliary: No masses or other significant abnormality identified. Gallbladder is unremarkable.  Pancreas: No mass, inflammatory changes, or other significant abnormality identified.  Spleen:  Within normal limits in size and  appearance.  Adrenals:  No masses identified.  Kidneys/Urinary Tract:  No evidence of masses or hydronephrosis.  Stomach/Bowel/Peritoneum: No evidence of wall thickening, mass, or obstruction. Normal appendix visualized.  Vascular/Lymphatic: No pathologically enlarged lymph nodes identified. No other significant abnormality visualized.  Reproductive: Uterus is unremarkable. A cystic lesion showing a probable thin internal septation is seen in the high left adnexal region, measuring 4.0 by 3.4 x 2.6 cm on image 56/ series 2. This is consistent with a complex left ovarian cyst. Small amount of free fluid is noted within the pelvis.  Other:  None.  Musculoskeletal:  No suspicious bone lesions identified.  IMPRESSION: 4 cm mildly complex left adnexal/ovarian cyst, with indeterminate but probably benign appearance. Small amount of free fluid also seen within pelvis. Followup by pelvic ultrasound recommended in 6-12 weeks.  No other significant abnormality identified within the abdomen or pelvis.   Electronically Signed   By: Earle Gell M.D.   On: 06/20/2014 19:43     EKG Interpretation None      MDM   Final diagnoses:  Abdominal pain, periumbilical    49 yo F with PMH of IBS, arthritis, presenting with abdominal pain.  Onset 11 AM this morning, has continued to worsen since that time.  Pain initially above umbilicus, has progressed to periumbilical area.  Reports nausea, no appetite since onset of pain.  +chills, no known fevers.  No vomiting, diarrhea.  Small BM today, non-bloody.  Denies dysuria.  No prior h/o abdominal surgery.  On presentation, pt alert, well appearing, in NAD.  CV and lung exam WNL.  Abdomen soft, TTP to periumbilical area with mild RLQ TTP, no distention, rebound, guarding.  No other acute findings. CBC significant for leukocytosis to 15.  CMP and lipase WNL. Possible appendicitis vs diverticulitis vs gastritis/gastroenteritis.  Symptoms atypical for SBO, mesenteric ischemia.  Plan  to CT A/P.  Symptomatic treatment given.  CT shows L ovarian cyst, no other acute findings; appendix normal.  Discussed with pt- reports known h/o L ovarian dermoid cyst, findings not new and doubt this as cause of abdominal pain.  Pt symptomatically improved.  Will prescribe pain meds and discussed return to ED in 24 hours for re-evaluation for possible appendicitis if pain persists, as she is early on in course of symptoms.  Pt states understanding of plan, all questions answered.  No other concerns, stable at discharge.  Discussed with attending Dr. [name] Heck.    Ellwood Dense, MD 06/22/14 1541  Noemi Chapel, MD 06/22/14 [phone]

## 2014-06-20 NOTE — Telephone Encounter (Signed)
Patient Name: Kimberly Stevens  DOB: Jun 15, 1965    Initial Comment Pt has severe abd pain, nausea.    Nurse Assessment  Nurse: Thad Ranger RN, Langley Gauss Date/Time (Eastern Time): 06/20/2014 4:30:50 PM  Confirm and document reason for call. If symptomatic, describe symptoms. ---Pt has severe abd pain, nausea. Denies vomiting, diarrhea, and fever.  Has the patient traveled out of the country within the last 30 days? ---Not Applicable  Does the patient require triage? ---Yes  Related visit to physician within the last 2 weeks? ---No  Does the PT have any chronic conditions? (i.e. diabetes, asthma, etc.) ---Yes  List chronic conditions. ---Diverticulitits  Did the patient indicate they were pregnant? ---No     Guidelines    Guideline Title Affirmed Question Affirmed Notes  Abdominal Pain - Female [1] SEVERE pain (e.g., excruciating) AND [2] present > 1 hour    Final Disposition User   Go to ED Now Thad Ranger, RN, [employer]

## 2014-06-20 NOTE — ED Notes (Signed)
Pt leaving for Ct

## 2014-06-20 NOTE — ED Provider Notes (Signed)
The patient presents to the hospital with a complaint of abdominal pain which started at 11:00 this morning, has been persistent but waxing and waning in intensity throughout the day, it is mid abdominal, just above the umbilicus, there is some right lower quadrant pain but this is minimal, some associated back pain. No nausea vomiting diarrhea, she has not had a bowel movement or passed any gas today. On exam the patient has moderate abdominal tenderness in the mid abdominal region, no pain at McBurney's point, no left lower quadrant tenderness, labs show a leukocytosis of 15,000, CT scan shows a complex cyst (patient aware of a dermoid cyst on the left ovary) it also shows no findings consistent with appendicitis or any other bowel or intra-abdominal etiology of her pain. She has improved, she is requesting a second dose of pain medication, she will be able to be discharged home in a stable condition to follow up in the outpatient setting or return to the ER in 12-24 hours if no improvement or if symptoms worsen, she has expressed her understanding to this.  I saw and evaluated the patient, reviewed the resident's note and I agree with the findings and plan.    Final diagnoses:  Abdominal pain, periumbilical      Noemi Chapel, MD 06/22/14 [phone]

## 2014-06-20 NOTE — ED Notes (Signed)
Pt in c/o umbilical abd pain that started this morning and has worsened through the day, reports nausea and decreased appetite, chills but denies fever, no distress noted

## 2014-06-20 NOTE — Discharge Instructions (Signed)

## 2014-06-21 NOTE — Telephone Encounter (Signed)
Patient seen in ED. 

## 2014-06-21 NOTE — Telephone Encounter (Signed)
Pt was seen in the ER 

## 2014-06-27 ENCOUNTER — Telehealth: Payer: Self-pay | Admitting: Obstetrics & Gynecology

## 2014-06-27 DIAGNOSIS — N83202 Unspecified ovarian cyst, left side: Secondary | ICD-10-CM

## 2014-06-27 NOTE — Telephone Encounter (Signed)
Spoke with patient. Patient states that she was seen at [facility] on 06/20/2014 and had a CT Scan. "I think it was GI related but they saw the cyst on my ovary." States was advised to follow up with Dr.Miller regarding cyst on left ovary. States Dr.Miller has been following this for a while but they recommend that she have it removed. Patient states she has also met with Dr.Silva regarding "bladder surgery." Patient would like CT scan reviewed and further recommendations made. Patient is interested in having surgery for cyst removal and bladder at the same time. Denies any current discomfort. Advised will have Dr.Silva review CT scan and will return call with further recommendations. Copy of CT to Dr.Miller's deak as well.  CC: Dr.Miller 

## 2014-06-27 NOTE — Telephone Encounter (Signed)
Please have patient make an appointment to see Dr. [name] Heck for ovarian cyst.  I am happy to help on the bladder side of her care.  I dot not believe that she had had urodynamic testing yet.

## 2014-06-27 NOTE — Telephone Encounter (Signed)
I have reviewed the CT.  I've been watching a small 41mm place on her ovary.  This cyst was 4cm, so not the same this.  This cyst is new since her ultrasound done in our office.  Typically these cysts fully resolve and the reason to proceed with surgery is pain.  So, ok to have OV and discuss removal if having a lot of pain.  Also, ok to repeat PUS in 4-6 weeks as cyst will probably go away.  If it does or is even just smaller in that time frame, I know it is benign and can continue to be watched.

## 2014-06-27 NOTE — Telephone Encounter (Signed)
Spoke with patient. Advised of message as seen below from [place]. Patient is agreeable. Patient would like me to also speak with Dr.Miller before scheduling appointment. Advised I will speak with Dr.Miller upon her return to the office and return call to patient. Patient is agreeable.

## 2014-06-27 NOTE — Telephone Encounter (Signed)
Patient states that she is having issue and may want to move forward with surgery and needs questions answered and advice.Patient ok for call back

## 2014-06-28 NOTE — Telephone Encounter (Signed)
Spoke with patient. Advised of message as seen below from [place]. Patient is agreeable. States she is not experiencing any discomfort. Would like to wait and have repeat PUS. Appointment for PUS scheduled for 7/7 at 12:30pm with 1 pm consult with Dr.Miller. Patient is agreeable. Order placed for precert.  Routing to provider for final review. Patient agreeable to disposition. Will close encounter.

## 2014-07-03 ENCOUNTER — Encounter: Payer: Self-pay | Admitting: Sports Medicine

## 2014-07-03 ENCOUNTER — Ambulatory Visit (INDEPENDENT_AMBULATORY_CARE_PROVIDER_SITE_OTHER): Payer: Managed Care, Other (non HMO) | Admitting: Sports Medicine

## 2014-07-03 VITALS — BP 96/49 | Ht 64.0 in | Wt 136.0 lb

## 2014-07-03 DIAGNOSIS — S46302A Unspecified injury of muscle, fascia and tendon of triceps, left arm, initial encounter: Secondary | ICD-10-CM

## 2014-07-03 NOTE — Assessment & Plan Note (Signed)
This continues to improve  Much smaller tear seen Less hypoechoic change  Cont NTG protocol Add light weight to triceps exercises Use compression if active  Reck 87mos

## 2014-07-03 NOTE — Progress Notes (Signed)
Patient ID: Kimberly Stevens, female   DOB: 1965/02/24, 49 y.o.   MRN: [id number]   BP 96/49 mmHg  Ht 5\' 4"  (1.626 m)  Wt 136 lb (61.689 kg)  BMI 23.33 kg/m2  LMP 05/25/2014   Left elbow still moderate pain with certain activities Partial tricepts tear prob cross fit activity w burpees, etc Using 1/4 nitro patch and performing exercises we gave  Much less painful than at beginning Almost no pain at rest or with sleep  Doing motion exercise for elbow No real weights Does not use compression often\  Exam NAD BP 96/49 mmHg  Ht 5\' 4"  (1.626 m)  Wt 136 lb (61.689 kg)  BMI 23.33 kg/m2  LMP 05/25/2014  Left elbow: full ROM Good strength to retesting in extension and flexion rotatoin is pain free No defects noted No bruising No TTP Triceps squeeze neg  [place] First scan showed > 1 cm gap Now she is down to 0.29 cm on lateral triceps Less hypoechoic change Remainder of tendon is nl

## 2014-07-26 ENCOUNTER — Ambulatory Visit (INDEPENDENT_AMBULATORY_CARE_PROVIDER_SITE_OTHER): Payer: Managed Care, Other (non HMO) | Admitting: Obstetrics & Gynecology

## 2014-07-26 ENCOUNTER — Encounter: Payer: Self-pay | Admitting: Obstetrics & Gynecology

## 2014-07-26 ENCOUNTER — Ambulatory Visit (INDEPENDENT_AMBULATORY_CARE_PROVIDER_SITE_OTHER): Payer: Managed Care, Other (non HMO)

## 2014-07-26 VITALS — BP 98/62 | HR 60 | Resp 16 | Wt 131.0 lb

## 2014-07-26 DIAGNOSIS — N8320 Unspecified ovarian cysts: Secondary | ICD-10-CM | POA: Diagnosis not present

## 2014-07-26 DIAGNOSIS — D251 Intramural leiomyoma of uterus: Secondary | ICD-10-CM | POA: Diagnosis not present

## 2014-07-26 DIAGNOSIS — N83202 Unspecified ovarian cyst, left side: Secondary | ICD-10-CM

## 2014-07-26 NOTE — Progress Notes (Signed)
49 y.o. Kimberly Stevens here for a pelvic ultrasound follow up after having 4cm left ovarian cyst noted on CT scan in ER on 06/20/14.  Pt reports she went for a mid-epigastric pain that started in the morning and worsened all day.  By early afternoon she was very uncomfortable.  CT was negative except for the ovarian cyst.  reviewed with her today.  Reports the pain was high and not down low.  As well the pain was midline and not localized on one side or the other.  Has follow scheduled with Dr. Henrene Pastor, GI.  No LMP recorded.  Sexually active:  yes  Contraception: vasectomy  FINDINGS: UTERUS:  6.9 x 5.4 x 4.6cm with small 48mm intramural fibroid EMS: 9.13mm ADNEXA:   Left ovary 2.5 x 1.5 x 1.2cm with small 36mm follicle, prior cyst resolved   Right ovary 3.0 x 1.5 x 1.2cm with 27mm echogenic follicle, noted on prior ultrasound in march and was 5 x 45mm then CUL DE SAC: small amt, clear fluid noted  Findings reviewed with pt.  I do feel she should proceed with GI evaluation with Dr. Henrene Pastor as ultrasound today showed complete resolution of 4cm ovarian cyst.  As well, the echogenic area on the right ovary is slightly smaller from 3/16.  No specific follow up is needed as this was followed with Dr. Corinna Capra and has never changed.  All questions answered.  Assessment:  4cm left ovarian cyst, resolved 48mm echogenic follicle on right ovary, smaller than 3/16 PUS Small 2mm intramural fibroid  Plan: Pt will return for AEX.  No additional ultrasounds needed at this time. Pt will follow up with Dr. Henrene Pastor.  Reports already has appt.  Confirmed in EPIC on 09/17/14.  ~15 minutes spent with patient >50% of time was in face to face discussion of above.

## 2014-08-01 ENCOUNTER — Ambulatory Visit (INDEPENDENT_AMBULATORY_CARE_PROVIDER_SITE_OTHER): Payer: Managed Care, Other (non HMO) | Admitting: Sports Medicine

## 2014-08-01 ENCOUNTER — Encounter: Payer: Self-pay | Admitting: Sports Medicine

## 2014-08-01 VITALS — BP 102/52 | Ht 64.0 in | Wt 136.0 lb

## 2014-08-01 DIAGNOSIS — S46302A Unspecified injury of muscle, fascia and tendon of triceps, left arm, initial encounter: Secondary | ICD-10-CM | POA: Diagnosis not present

## 2014-08-01 NOTE — Progress Notes (Signed)
Patient ID: Kimberly Stevens, female   DOB: 23-Mar-1965, 49 y.o.   MRN: [id number]  Patient is now 5 months into treatment for a left triceps tear above the medial epicondyle Pain over all is less She is doing triceps rehabilitation in 3 positions with light weight No pain with this  She has kept her swimming fairly short and it does not cause pain  She still has some sensitivity along the medial triceps particularly when she bumps it or rests her arm on a table  No swelling  On nitroglycerin protocol  Examination No acute distress BP 102/52 mmHg  Ht 5\' 4"  (1.626 m)  Wt 136 lb (61.689 kg)  BMI 23.33 kg/m2  Good triceps strength to resistance in neutral and elevated positions Normal biceps function Normal pronation supination of the elbow and  She has about 5-10 of hyperextension of the left elbow Tenderness to palpation over the cubital tunnel  Ultrasound The original hypoechoic area which was about 2 cm long and 1 cm wide over the medial triceps has completely resolved Very slight hypoechoic change in the medial muscle There is some hyperechoic change suggestive of scar tissue Ulnar Nerve is shown to sublux out of the cubital canal with full elbow extension and flexion

## 2014-08-01 NOTE — Assessment & Plan Note (Signed)
Excellent healing: Examination and ultrasound Complete one more month and nitroglycerin Then wean this off over one week  Keep up tricepts exercises Gradually increase swimming Gradually increase weight resistance with weight exercises  Recheck with me when necessary

## 2014-08-14 ENCOUNTER — Ambulatory Visit: Payer: Managed Care, Other (non HMO) | Admitting: Internal Medicine

## 2014-09-17 ENCOUNTER — Ambulatory Visit (INDEPENDENT_AMBULATORY_CARE_PROVIDER_SITE_OTHER): Payer: Managed Care, Other (non HMO) | Admitting: Internal Medicine

## 2014-09-17 ENCOUNTER — Encounter: Payer: Self-pay | Admitting: Internal Medicine

## 2014-09-17 VITALS — BP 104/72 | HR 64 | Ht 63.78 in | Wt 133.0 lb

## 2014-09-17 DIAGNOSIS — R1013 Epigastric pain: Secondary | ICD-10-CM

## 2014-09-17 MED ORDER — HYOSCYAMINE SULFATE 0.125 MG SL SUBL: SUBLINGUAL_TABLET | SUBLINGUAL | Status: DC

## 2014-09-17 NOTE — Progress Notes (Signed)
HISTORY OF PRESENT ILLNESS:  Kimberly Stevens is a 49 y.o. female who presents herself today with a chief complaint of recurrent abdominal pain. Patient states she has had these problems intermittently for at least 10 years. She was evaluated in August 2010 for abdominal discomfort, nausea, bloating. 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 She was reevaluated 02/23/2012 regarding recurrent epigastric discomfort. See that dictation. On 02/24/2012 she underwent colonoscopy and upper endoscopy. Colonoscopy with intubation of the terminal ileum was unremarkable. A tiny non-adenomatous appearing polyp was removed. Follow-up in 10 years recommended. Upper endoscopy revealed non-adenomatous duodenal polyp but was otherwise normal. Abdominal ultrasound was unremarkable. She was prescribed omeprazole 20 mg daily and told to follow-up in 4 weeks. She did not. Apparently felt better. Continues to report slightly left of epigastric upper abdominal discomfort which can occur in the evenings. Improved with direct massage. No obvious exacerbating or relieving factors in addition. Discomfort last for an evening then abates. Has tried Gas-X. Went to the emergency room in June with severe abdominal pain. CT scan was negative except for left adnexal cyst. Subsequent GYN evaluation negative by Dr. [name] Heck. Blood work in the emergency room remarkable for white blood cell count of 15.8 with left shift. Normal comprehensive metabolic panel. She has been having respiratory problems for about 6 weeks. No GI bleeding or significant change in bowel habits. Weight has been stable.  REVIEW OF SYSTEMS:  All non-GI ROS negative except for arthritis, coughing, depression  Past Medical History  Diagnosis Date  . Dermoid cyst of ovary   . IBS (irritable bowel syndrome)   . Depression   . Polyp of duodenum 2014    tiny  . Fibrocystic breast   . Arthritis     left great toe joint  . Plantar fasciitis     left foot  2/15-11/15  . Tendonitis     right knee 2/15-11/15  . Eye disorder     like glaucoma-being followed by Dr Tyrone Schimke  . Colon polyps     benign    Past Surgical History  Procedure Laterality Date  . Wisdom tooth extraction      Social History Kimberly Stevens  reports that she has never smoked. She has never used smokeless tobacco. She reports that she drinks alcohol. She reports that she does not use illicit drugs.  family history includes Arthritis in her maternal grandmother, mother, and paternal grandmother; Diverticulitis in her father; Leukemia in her father and maternal uncle. There is no history of Colon cancer or Colon polyps.  Allergies  Allergen Reactions  . Erythromycin Ethylsuccinate     REACTION: tremors/? If true allergy  . Penicillins     REACTION: tremors ? If true allergy       PHYSICAL EXAMINATION: Vital signs: BP 104/72 mmHg  Pulse 64  Ht 5' 3.78" (1.62 m)  Wt 133 lb (60.328 kg)  BMI 22.99 kg/m2  LMP 08/20/2014 (Approximate) General: Well-developed, well-nourished, no acute distress HEENT: Sclerae are anicteric, conjunctiva pink. Oral mucosa intact Lungs: Clear Heart: Regular Abdomen: soft, nontender, nondistended, no obvious ascites, no peritoneal signs, normal bowel sounds. No organomegaly. Extremities: No edema Psychiatric: alert and oriented x3. Cooperative   ASSESSMENT:  #1. Chronic intermittent abdominal discomfort as described. Previous extensive workups unrevealing. May be spasm #2. Colonoscopy February 2014 unremarkable #3. Upper endoscopy February 2014 unremarkable #4. Since CT scan negative except for left adnexal cyst. Negative subsequent GYN evaluation  PLAN:  #1. Prescribe Levsin sublingual 0.125 mg. 1-2  sublingual every 4 hours when necessary pain #2. Repeat colonoscopy February 2024 #3. Interval GI follow-up as needed

## 2014-09-17 NOTE — Patient Instructions (Signed)
We have sent the following medications to your pharmacy for you to pick up at your convenience:  Levsin  Please follow up as needed  

## 2014-12-26 ENCOUNTER — Other Ambulatory Visit: Payer: Self-pay | Admitting: Obstetrics & Gynecology

## 2014-12-26 NOTE — Telephone Encounter (Signed)
Kimberly Stevens, RF request came in for Diflucan. Can you please call and assess and see if pt needs appt.  Rx declined for now.  Thanks.  CC:  [name]

## 2014-12-26 NOTE — Telephone Encounter (Signed)
Medication refill request: Diflucan 150 mg Last AEX:  01/16/2014 SM Next AEX: 02/07/2015 SM Last MMG (if hormonal medication request): 06/08/2014 BIRADS Category 1 Refill authorized: 02/13/2014 Diflucan 150 mg #2 tablets 0 Refills  Today: #2 tablets 0 Refills

## 2014-12-27 ENCOUNTER — Other Ambulatory Visit: Payer: Self-pay | Admitting: Obstetrics & Gynecology

## 2014-12-27 NOTE — Telephone Encounter (Signed)
Patient is scheduled for 12/28/2014 at 8 am with Melvia Heaps CNM for evaluation of symptoms.

## 2014-12-28 ENCOUNTER — Ambulatory Visit (INDEPENDENT_AMBULATORY_CARE_PROVIDER_SITE_OTHER): Payer: Managed Care, Other (non HMO) | Admitting: Certified Nurse Midwife

## 2014-12-28 ENCOUNTER — Encounter: Payer: Self-pay | Admitting: Certified Nurse Midwife

## 2014-12-28 VITALS — BP 102/62 | HR 68 | Resp 16 | Ht 63.5 in | Wt 136.0 lb

## 2014-12-28 DIAGNOSIS — N898 Other specified noninflammatory disorders of vagina: Secondary | ICD-10-CM

## 2014-12-28 DIAGNOSIS — B9689 Other specified bacterial agents as the cause of diseases classified elsewhere: Secondary | ICD-10-CM

## 2014-12-28 DIAGNOSIS — A499 Bacterial infection, unspecified: Secondary | ICD-10-CM | POA: Diagnosis not present

## 2014-12-28 DIAGNOSIS — N760 Acute vaginitis: Secondary | ICD-10-CM

## 2014-12-28 DIAGNOSIS — B373 Candidiasis of vulva and vagina: Secondary | ICD-10-CM

## 2014-12-28 DIAGNOSIS — B3731 Acute candidiasis of vulva and vagina: Secondary | ICD-10-CM

## 2014-12-28 DIAGNOSIS — N9489 Other specified conditions associated with female genital organs and menstrual cycle: Secondary | ICD-10-CM | POA: Diagnosis not present

## 2014-12-28 LAB — WET PREP BY MOLECULAR PROBE
Candida species: NEGATIVE
Gardnerella vaginalis: POSITIVE — AB
Trichomonas vaginosis: NEGATIVE

## 2014-12-28 MED ORDER — HYLAFEM VA SUPP
1.0000 | Freq: Every day | VAGINAL | Status: DC
Start: 2014-12-28 — End: 2015-02-07

## 2014-12-28 NOTE — Progress Notes (Signed)
49 y.o.Married white female g2p2002 here with complaint of vaginal symptoms of   increase discharge, large amount. Describes discharge as white thick ? odor.Onset of symptoms 30  days ago. Denies new personal products or vaginal dryness that she is aware. no STD concerns. Urinary symptoms none . Contraception is spouse vasectomy. Tried Diflucan she had at home and no change in symptoms when used 2 weeks ago. She is staying in wet workout clothes more often. No other health issues today. Periods sporadic, but no missed periods or problems with.   O:Healthy female WDWN Affect: normal, orientation x 3  Exam: Abdomen:soft,non tender Lymph node: no enlargement or tenderness Pelvic exam: External genital: normal female with slight edema, no lesions or scaling BUS: negative Vagina: watery white grey odorous discharge noted. Ph: 5.0  ,Wet prep taken, Affirm taken Cervix: normal, non tender, no CMT Uterus: normal, non tender Adnexa:normal, non tender, no masses or fullness noted   Wet Prep results:positive for yeast and clue   A:Normal pelvic exam BV Yeast vaginitis   P:Discussed findings of Bv and yeast vaginitis and etiology. Discussed Aveeno or baking soda sitz bath for comfort. Avoid moist clothes or pads for extended period of time. If working out in gym clothes for long periods of time change underwear.Coconut Oil use for skin protection prior to activity can be used to external skin. Dove soap to bathe with.  Rx: Hylafem with instructions see order  Rv prn

## 2014-12-28 NOTE — Patient Instructions (Signed)

## 2015-01-02 NOTE — Progress Notes (Signed)
Reviewed personally.  M. Suzanne Miller, MD.  

## 2015-02-07 ENCOUNTER — Ambulatory Visit (INDEPENDENT_AMBULATORY_CARE_PROVIDER_SITE_OTHER): Payer: BLUE CROSS/BLUE SHIELD | Admitting: Obstetrics & Gynecology

## 2015-02-07 ENCOUNTER — Encounter: Payer: Self-pay | Admitting: Obstetrics & Gynecology

## 2015-02-07 VITALS — BP 92/60 | HR 68 | Resp 18 | Ht 63.5 in | Wt 139.0 lb

## 2015-02-07 DIAGNOSIS — Z01419 Encounter for gynecological examination (general) (routine) without abnormal findings: Secondary | ICD-10-CM

## 2015-02-07 NOTE — Progress Notes (Signed)
50 y.o. [id number] MarriedCaucasianF here for annual exam.  Pt's had two orthopedic issues this past year.  She had plantar fasciitis and left triceps tear.  Both of these issues have resolved.  Cycles are less regular.  Flow lasts about four or five days.  First day or two can be heavy.    Biggest issue for her is urinary leakage.  This is worse when on cycle or when she has an infection.  She has seen Dr. Quincy Simmonds for this.  Is considering having a sling placed.  Did see Hart Robinsons once but isn't doing the exercises so hasn't gone for follow-up.    Patient's last menstrual period was 01/07/2015 (approximate).          Sexually active: Yes.    The current method of family planning is vasectomy.    Exercising: Yes.    Weight training, walking, running Smoker:  no  Health Maintenance: Pap:  01/16/14 Neg, neg pap 10/14 with neg HR HPV History of abnormal Pap:  no MMG:  06/08/14 BIRADS1:neg Colonoscopy:  02/24/12 Polyps - repeat 10 years  BMD:   Never TDaP:  10/2012  Screening Labs: 12/15, Hb today: , Urine today: not done, last done June, 2016   reports that she has never smoked. She has never used smokeless tobacco. She reports that she drinks about 2.4 oz of alcohol per week. She reports that she does not use illicit drugs.  Past Medical History  Diagnosis Date  . Dermoid cyst of ovary   . IBS (irritable bowel syndrome)   . Depression   . Polyp of duodenum 2014    tiny  . Fibrocystic breast   . Arthritis     left great toe joint  . Plantar fasciitis     left foot 2/15-11/15  . Tendonitis     right knee 2/15-11/15  . Eye disorder     like glaucoma-being followed by Dr Tyrone Schimke  . Colon polyps     benign    Past Surgical History  Procedure Laterality Date  . Wisdom tooth extraction      Current Outpatient Prescriptions  Medication Sig Dispense Refill  . montelukast (SINGULAIR) 10 MG tablet Take 10 mg by mouth at bedtime.    . Olopatadine HCl 0.6 % SOLN Place into the nose.    Marland Kitchen UNABLE  TO FIND daily. Juice plus     No current facility-administered medications for this visit.    Family History  Problem Relation Age of Onset  . Leukemia Father     Myloma  . Diverticulitis Father   . Leukemia Maternal Uncle   . Colon cancer Neg Hx   . Colon polyps Neg Hx   . Arthritis Mother   . Arthritis Maternal Grandmother   . Arthritis Paternal Grandmother     ROS:  Pertinent items are noted in HPI.  Otherwise, a comprehensive ROS was negative.  Exam:   BP 92/60 mmHg  Pulse 68  Resp 18  Ht 5' 3.5" (1.613 m)  Wt 139 lb (63.05 kg)  BMI 24.23 kg/m2  LMP 01/07/2015 (Approximate)  Weight change: +2#  Height: 5' 3.5" (161.3 cm)  Ht Readings from Last 3 Encounters:  02/07/15 5' 3.5" (1.613 m)  12/28/14 5' 3.5" (1.613 m)  09/17/14 5' 3.78" (1.62 m)    General appearance: alert, cooperative and appears stated age Head: Normocephalic, without obvious abnormality, atraumatic Neck: no adenopathy, supple, symmetrical, trachea midline and thyroid normal to inspection and palpation Lungs: clear to auscultation  bilaterally Breasts: normal appearance, no masses or tenderness Heart: regular rate and rhythm Abdomen: soft, non-tender; bowel sounds normal; no masses,  no organomegaly Extremities: extremities normal, atraumatic, no cyanosis or edema Skin: Skin color, texture, turgor normal. No rashes or lesions Lymph nodes: Cervical, supraclavicular, and axillary nodes normal. No abnormal inguinal nodes palpated Neurologic: Grossly normal   Pelvic: External genitalia:  no lesions              Urethra:  normal appearing urethra with no masses, tenderness or lesions              Bartholins and Skenes: normal                 Vagina: normal appearing vagina with normal color and discharge, no lesions              Cervix: no lesions              Pap taken: No. Bimanual Exam:  Uterus:  normal size, contour, position, consistency, mobility, non-tender              Adnexa: normal adnexa  and no mass, fullness, tenderness               Rectovaginal: Confirms               Anus:  normal sphincter tone, no lesions  Chaperone was present for exam.  A:  Well Woman with normal exam Perimenopausal bleeding ([place] 2014 was 12, and 2015 58) H/O recurrent UTIs H/O recurrent BV  H/O dermoid, 5x5 mm solid focus followed on ultrasound for 7 years.  PUS 2015, was 59mm SUI, considering surgery  P: Mammogram yearly. Grade 3 dense breasts. 3D MMG yearly recommended. Knows to call if she wants to proceed with surgical scheduling Pap smear with HR HPV today 2014. normal pap 12/15.  No pap today Labs were all done last year.  No lab work today. return annually or pr

## 2015-04-09 ENCOUNTER — Encounter: Payer: Self-pay | Admitting: Sports Medicine

## 2015-04-09 ENCOUNTER — Ambulatory Visit (INDEPENDENT_AMBULATORY_CARE_PROVIDER_SITE_OTHER): Payer: BLUE CROSS/BLUE SHIELD | Admitting: Sports Medicine

## 2015-04-09 VITALS — BP 133/87 | Ht 64.0 in | Wt 134.0 lb

## 2015-04-09 DIAGNOSIS — M7711 Lateral epicondylitis, right elbow: Secondary | ICD-10-CM | POA: Insufficient documentation

## 2015-04-09 MED ORDER — NITROGLYCERIN 0.2 MG/HR TD PT24: MEDICATED_PATCH | TRANSDERMAL | Status: DC

## 2015-04-09 NOTE — Patient Instructions (Addendum)
OK to exercise but nothing with palm down If a weight exercise hurts - it is probably wrong  All other exercises in general are OK including triceps No wrist curls No kettle ball swings No push ups/ or burpees  Repeat the scan in 6 weeks  Nitroglycerin Protocol   Apply 1/4 nitroglycerin patch to affected area daily.  Change position of patch within the affected area every 24 hours.  You may experience a headache during the first 1-2 weeks of using the patch, these should subside.  If you experience headaches after beginning nitroglycerin patch treatment, you may take your preferred over the counter pain reliever.  Another side effect of the nitroglycerin patch is skin irritation or rash related to patch adhesive.  Please notify our office if you develop more severe headaches or rash, and stop the patch.  Tendon healing with nitroglycerin patch may require 12 to 24 weeks depending on the extent of injury.  Men should not use if taking Viagra, Cialis, or Levitra.   Do not use if you have migraines or rosacea.

## 2015-04-09 NOTE — Assessment & Plan Note (Signed)
NTG protocol Eccentric exercise protocol Avoid exercises w wrist extension Elbow compression  REck in 6 weeks w repeat scan

## 2015-04-09 NOTE — Progress Notes (Signed)
Patient ID: Kimberly Stevens, female   DOB: 29-Sep-1965, 50 y.o.   MRN: [id number]  CC: RT lateral elbow pain  Patient has been in fitness class Doing some heavier weight exercises and kettle balls Now 8 wks of lateral elbow pain Compression sleeve helps but still very painful Ibuprofen helps some  However, now more severe pain Now waking her up at night Hard to hold a coffee cup  Soc: non smoker  ROS Denies neck pain No radicular sxs  EXAM NAD BP 133/87 mmHg  Ht 5\' 4"  (1.626 m)  Wt 134 lb (60.782 kg)  BMI 22.99 kg/m2  RT. Elbow shows full ROM TTP lat epicond Pain on grip testing Pain on digital extension with resistance Pain on wrist extension  Book test very +  [place] RT elbow "ski jump fracture" Hypoechoic change at insertion of ext tendons into lat epicond Hypoechoic splits between ext tendons Marked doppler flow and neovessels

## 2015-05-08 ENCOUNTER — Other Ambulatory Visit: Payer: Self-pay

## 2015-05-08 DIAGNOSIS — Z1231 Encounter for screening mammogram for malignant neoplasm of breast: Secondary | ICD-10-CM

## 2015-05-14 ENCOUNTER — Encounter: Payer: Self-pay | Admitting: Sports Medicine

## 2015-05-14 ENCOUNTER — Ambulatory Visit (INDEPENDENT_AMBULATORY_CARE_PROVIDER_SITE_OTHER): Payer: BLUE CROSS/BLUE SHIELD | Admitting: Sports Medicine

## 2015-05-14 VITALS — BP 113/59 | HR 63 | Ht 64.0 in | Wt 134.0 lb

## 2015-05-14 DIAGNOSIS — M7711 Lateral epicondylitis, right elbow: Secondary | ICD-10-CM | POA: Diagnosis not present

## 2015-05-14 NOTE — Progress Notes (Signed)
   Subjective:    Patient ID: Kimberly Stevens, female    DOB: [birth year], 50 y.o.   MRN: [id number]  HPI  50 yo female is here for follow up of right elbow pain.  Was seen on 3/21 by Dr. Oneida Alar for right lateral elbow pain, had tenderness over lateral epicond, ultrasound showed inflammation at the lat epicond area. Was prescribed NTG protocol, elbow compression.   She recently traveled, had to lift her luggage in the plane which flared up her pain. She also had to clean her floor when she had an ice box leak at her house. Continues to have pain with movement of the elbow. Unable to pick up drinks with the right elbow without causing pain. Worried whether she torn something there.   Review of Systems  Constitutional: Negative for fever and chills.  Respiratory: Negative for shortness of breath.   Cardiovascular: Negative for chest pain.  Musculoskeletal: Positive for arthralgias. Negative for back pain and joint swelling.  Neurological: Negative for weakness and numbness.       Objective:   Physical Exam  Constitutional: She is oriented to person, place, and time. She appears well-developed and well-nourished. No distress.  Eyes: Conjunctivae are normal. Right eye exhibits no discharge. Left eye exhibits no discharge.  Musculoskeletal:  Mild TTP over the lateral epicond region. Good ROM and strn.   Neurological: She is alert and oriented to person, place, and time.  Skin: She is not diaphoretic.     Filed Vitals:   05/14/15 1406  BP: 113/59  Pulse: 63    [place] of right elbow performed.  Lateral epicondyle: hypoechoic region has reduced in size. Doppler flow remains visible, however, it's more localized now. No tendon tears.  Neovessels limited to one area      Assessment & Plan:  See problem based a&p.

## 2015-05-14 NOTE — Assessment & Plan Note (Signed)
Continues to have pain likely 2/2 to flaring up from recent lifting of luggage during her flight.   US shows improvement of the inflammation.  No tendon tear.  - asked to continue NTG patch - cont ice and PT - ibuprofen PRN - f/up in 6 weeks.

## 2015-06-11 ENCOUNTER — Ambulatory Visit
Admission: RE | Admit: 2015-06-11 | Discharge: 2015-06-11 | Disposition: A | Discharge status: Home / Self Care | Payer: BLUE CROSS/BLUE SHIELD | Source: Ambulatory Visit

## 2015-06-11 DIAGNOSIS — Z1231 Encounter for screening mammogram for malignant neoplasm of breast: Secondary | ICD-10-CM

## 2015-06-25 ENCOUNTER — Ambulatory Visit (INDEPENDENT_AMBULATORY_CARE_PROVIDER_SITE_OTHER): Payer: BLUE CROSS/BLUE SHIELD | Admitting: Sports Medicine

## 2015-06-25 ENCOUNTER — Encounter: Payer: Self-pay | Admitting: Sports Medicine

## 2015-06-25 VITALS — BP 93/46 | Ht 64.0 in | Wt 134.0 lb

## 2015-06-25 DIAGNOSIS — M7711 Lateral epicondylitis, right elbow: Secondary | ICD-10-CM

## 2015-06-25 DIAGNOSIS — M7712 Lateral epicondylitis, left elbow: Secondary | ICD-10-CM | POA: Diagnosis not present

## 2015-06-25 MED ORDER — NITROGLYCERIN 0.2 MG/HR TD PT24: MEDICATED_PATCH | TRANSDERMAL | Status: DC

## 2015-06-25 MED FILL — NITROGLYCERIN 0.2 MG/HR PTC: 0.2 | 30 days supply | Qty: 8 | Fill #0

## 2015-06-25 NOTE — Assessment & Plan Note (Signed)
Compression sleeve NTG protocol HEP modify weight lifting

## 2015-06-25 NOTE — Progress Notes (Signed)
Patient ID: Kimberly Stevens, female   DOB: 24-Jun-1965, 50 y.o.   MRN: [id number]  F/u RT tennis elbow New left tennis elbow  Patient seems to be irritating her elbows with a couple of weight exercise including cleans Pain starts after workouts  Treating the RT elbow and this has steadily improved and now pain is mild  Left elbow started hurting in past 2 weeks Doing HEP and NTG 1/4 patch as we did for RTelbow Feels better but still hurts with weights  Past Hx Left triceps tear Mult. Foot issues  ROS No nec k pain No numbness or tingling Feels weakness on left grip  PEXAM Pleasant F in NAD BP 93/46 mmHg  Ht 5\' 4"  (1.626 m)  Wt 134 lb (60.782 kg)  BMI 22.99 kg/m2  RT rlbow Full ROM Minimal TTP Strength testing is stronger but still gets mild pain  LT elbow Mild TTP at lat epi No swelling Full ROM Strength is less than on RT but testing elicits only mild pain  [place] RT/ LT RT shows small avulsion at tip of epi Hypoechoic change within tendon is less No true tears Increased doppler flow  LT elbow Small hypoechoic area Some slight calcification No tears Doppler flow is mild increase

## 2015-06-25 NOTE — Assessment & Plan Note (Signed)
This is clearly improved However on doppler she still shows a lot of excess flow  I think she has to change her weight lifting regimen for this to completley heal compression HEP NTG 1/4 Reck 2 mos

## 2015-12-03 ENCOUNTER — Ambulatory Visit (INDEPENDENT_AMBULATORY_CARE_PROVIDER_SITE_OTHER): Payer: 59 | Admitting: Sports Medicine

## 2015-12-03 ENCOUNTER — Encounter: Payer: Self-pay | Admitting: Sports Medicine

## 2015-12-03 DIAGNOSIS — M549 Dorsalgia, unspecified: Secondary | ICD-10-CM

## 2015-12-03 NOTE — Patient Instructions (Signed)
You have "computer neck" Nerve irritation from C5 nerve root to upper back  Trapezius spasm - do shake outs / fast for 60 secs  Posture - ear over shoulder  Cheek bone over Breast bone   Shoulder back  Easy neck motion   Watch computer position  During day do some T and H stretches - 30 secs at a time

## 2015-12-03 NOTE — Progress Notes (Signed)
CC; Upper back pain  Patient notes pain in upper back near border of lower RT scapula Doing burpees and weight lifting No specific injury in exercise class Massage helped Motion helps  Worse at times after computer work Trapezius spasm is frequent and more often on RT  Past HX Number of exercise related injuries as documented  Fam Hx ; Mother and sister? With neck/ disc issues  Soc Hx; works with computer a lot in business management  ROS No radicular sxs into arms Neck feels tight at times No cough sneeze pain  PE NAD, pleasant F BP 98/64   Ht 5\' 4"  (1.626 m)   Wt 135 lb (61.2 kg)   BMI 23.17 kg/m   Upper back - no TTP even at inf. Margin of scapula Trap. Spasm RT Normal scapular functions Normal RC testing\  Neck and shoulder forward position that is mild Neck rotation is more limited on RT Lateral bend is slt less Flexion and extension is normal Spurling neg On rotation gets referred pain to medial scapula

## 2015-12-11 ENCOUNTER — Telehealth: Payer: Self-pay | Admitting: Obstetrics & Gynecology

## 2015-12-11 ENCOUNTER — Ambulatory Visit: Payer: BLUE CROSS/BLUE SHIELD | Admitting: Certified Nurse Midwife

## 2015-12-11 NOTE — Telephone Encounter (Signed)
Left message to call Zadiel Leyh at [phone].  

## 2015-12-11 NOTE — Telephone Encounter (Signed)
Returned call to patient.  Patient states she started with  vaginal discharge without odor earlier in week and now has urinary frequency. Patient states possibly related to swimming or intercourse last week. Patient denies pain, fever, or burning with urination. Patient initially scheduled OV for today but had to cancel. Patient states she had family pictures already scheduled. Advised patient office will be closed until 11/27. Recommended patient seek care at local urgent care/minute clinic/ER if symptoms persist or worsen. Recommended AZO OTC to help relieve symptoms -advised this does not treat UTI. Patient verbalizes understanding and is agreeable.   Routing to provider for final review. Patient is agreeable to disposition. Will close encounter.

## 2015-12-11 NOTE — Telephone Encounter (Signed)
Patient called with uti symptoms. Offered appointment at [facility] today with Mrs Kimberly Stevens but she has a scheduling conflict.

## 2016-01-07 ENCOUNTER — Ambulatory Visit (INDEPENDENT_AMBULATORY_CARE_PROVIDER_SITE_OTHER): Payer: 59 | Admitting: Nurse Practitioner

## 2016-01-07 ENCOUNTER — Encounter: Payer: Self-pay | Admitting: Nurse Practitioner

## 2016-01-07 VITALS — BP 100/76 | HR 80 | Temp 97.4°F | Resp 16 | Ht 64.0 in | Wt 136.0 lb

## 2016-01-07 DIAGNOSIS — R319 Hematuria, unspecified: Secondary | ICD-10-CM | POA: Diagnosis not present

## 2016-01-07 DIAGNOSIS — N390 Urinary tract infection, site not specified: Secondary | ICD-10-CM | POA: Diagnosis not present

## 2016-01-07 LAB — POCT URINALYSIS DIPSTICK
BILIRUBIN UA: NEGATIVE
Glucose, UA: NEGATIVE
Ketones, UA: NEGATIVE
Nitrite, UA: NEGATIVE
PH UA: 5
PROTEIN UA: NEGATIVE
Urobilinogen, UA: NEGATIVE

## 2016-01-07 NOTE — Progress Notes (Deleted)
Patient ID: Kimberly Stevens, female   DOB: April 26, 1965, 50 y.o.   MRN: [id number]  50 y.o. G2P2002 Married  Caucasian Fe here for annual exam.    Patient's last menstrual period was 01/07/2016.          Sexually active: Yes.    The current method of family planning is vasectomy.    Exercising: {yes no:314532}  {types:19826} Smoker:  no  Health Maintenance: Pap:  01/16/14 Negative MMG: 06/11/15, 3D, Bi-Rads 1: Negative Colonoscopy:  02/24/12 Polyps - repeat 10 years  BMD:   Never TDaP: 11/15/12 Hep C and HIV: *** Labs: ***   reports that she has never smoked. She has never used smokeless tobacco. She reports that she drinks about 2.4 oz of alcohol per week . She reports that she does not use drugs.  Past Medical History:  Diagnosis Date  . Arthritis    left great toe joint  . Colon polyps    benign  . Depression   . Dermoid cyst of ovary   . Eye disorder    like glaucoma-being followed by Dr Tyrone Schimke  . Fibrocystic breast   . IBS (irritable bowel syndrome)   . Plantar fasciitis    left foot 2/15-11/15  . Polyp of duodenum 2014   tiny  . Tendonitis    right knee 2/15-11/15    Past Surgical History:  Procedure Laterality Date  . WISDOM TOOTH EXTRACTION      Current Outpatient Prescriptions  Medication Sig Dispense Refill  . UNABLE TO FIND daily. Juice plus    . nitroGLYCERIN (NITRODUR - DOSED IN MG/24 HR) 0.2 mg/hr patch Place 1/4 patch to affected area daily. (Patient not taking: Reported on 01/07/2016) 30 patch 1   No current facility-administered medications for this visit.     Family History  Problem Relation Age of Onset  . Leukemia Father     Myloma  . Diverticulitis Father   . Leukemia Maternal Uncle   . Colon cancer Neg Hx   . Colon polyps Neg Hx   . Arthritis Mother   . Arthritis Maternal Grandmother   . Arthritis Paternal Grandmother     ROS:  Pertinent items are noted in HPI.  Otherwise, a comprehensive ROS was negative.  Exam:   BP 100/76 (BP  Location: Right Arm, Patient Position: Sitting, Cuff Size: Normal)   Pulse 80   Temp 97.4 F (36.3 C) (Oral)   Resp 16   Ht 5\' 4"  (1.626 m)   Wt 136 lb (61.7 kg)   LMP 01/07/2016   BMI 23.34 kg/m  Height: 5\' 4"  (162.6 cm) Ht Readings from Last 3 Encounters:  01/07/16 5\' 4"  (1.626 m)  12/03/15 5\' 4"  (1.626 m)  06/25/15 5\' 4"  (1.626 m)    General appearance: alert, cooperative and appears stated age Head: Normocephalic, without obvious abnormality, atraumatic Neck: no adenopathy, supple, symmetrical, trachea midline and thyroid {EXAM; THYROID:18604} Lungs: clear to auscultation bilaterally Breasts: {Exam; breast:13139::"normal appearance, no masses or tenderness"} Heart: regular rate and rhythm Abdomen: soft, non-tender; no masses,  no organomegaly Extremities: extremities normal, atraumatic, no cyanosis or edema Skin: Skin color, texture, turgor normal. No rashes or lesions Lymph nodes: Cervical, supraclavicular, and axillary nodes normal. No abnormal inguinal nodes palpated Neurologic: Grossly normal   Pelvic: External genitalia:  no lesions              Urethra:  normal appearing urethra with no masses, tenderness or lesions  Bartholin's and Skene's: normal                 Vagina: normal appearing vagina with normal color and discharge, no lesions              Cervix: {exam; cervix:14595}              Pap taken: {yes no:314532} Bimanual Exam:  Uterus:  {exam; uterus:12215}              Adnexa: {exam; adnexa:12223}               Rectovaginal: Confirms               Anus:  normal sphincter tone, no lesions  Chaperone present: ***  A:  Well Woman with normal exam  P:   Reviewed health and wellness pertinent to exam  Pap smear as above  {plan; gyn:5269::"mammogram","pap smear","return annually or prn"}  An After Visit Summary was printed and given to the patient.

## 2016-01-07 NOTE — Patient Instructions (Addendum)
Acute Urinary Retention, Female Urinary retention means you are unable to pee completely or at all (empty your bladder). Follow these instructions at home:  Drink enough fluids to keep your pee (urine) clear or pale yellow.  If you are sent home with a tube that drains the bladder (catheter), there will be a drainage bag attached to it. There are two types of bags. One is big that you can wear at night without having to empty it. One is smaller and needs to be emptied more often.  Keep the drainage bag emptied.  Keep the drainage bag lower than the tube.  Only take medicine as told by your doctor. Contact a doctor if:  You have a low-grade fever.  You have spasms or you are leaking pee when you have spasms. Get help right away if:  You have chills or a fever.  Your catheter stops draining pee.  Your catheter falls out.  You have increased bleeding that does not stop after you have rested and increased the amount of fluids you had been drinking. This information is not intended to replace advice given to you by your health care provider. Make sure you discuss any questions you have with your health care provider. Document Released: 06/24/2007 Document Revised: 06/13/2015 Document Reviewed: 06/16/2012 Elsevier Interactive Patient Education  2017 [place]

## 2016-01-07 NOTE — Progress Notes (Signed)
50 y.o.[name] G2P2002 here with complaint of UTI, with onset about 2 weeks ago. Patient complaining of:  urinary frequency and urinary urgency Patient denies fever, chills, nausea or back pain. No new personal products. Patient feels symptoms are related to sexual activity. She has Macrobid to use PC but has not used.  Denies vaginal symptoms.    Contraception: husband had vasectomy.  No change in partner.  Patient does not have adequate water intake.   O: Healthy female WDWN Affect: Normal, orientation x 3 Skin : warm and dry CVAT: negative bilateral Abdomen: slight for suprapubic tenderness  Pelvic exam: deferred   POCT:  Moderate WBC, large RBC - on menses  A:  R/O UTI  History of pelvic floor weakness  History of PC UTI  P: Reviewed findings of UTI and need for treatment. Rx:  She will use Macrobid 100 mg BID x 3 days until culture is back from her med's at home along with Pyridium 200 mg prn [id number] micro, culture Reviewed warning signs and symptoms of UTI and need to advise if occurring. Encouraged to limit soda, tea, and coffee   RV prn

## 2016-01-08 LAB — URINALYSIS, MICROSCOPIC ONLY
Casts: NONE SEEN [LPF]
Crystals: NONE SEEN [HPF]
YEAST: NONE SEEN [HPF]

## 2016-01-09 LAB — URINE CULTURE

## 2016-01-14 NOTE — Progress Notes (Signed)
Encounter reviewed by Dr. Brook Amundson C. Silva.  

## 2016-01-15 ENCOUNTER — Telehealth: Payer: Self-pay

## 2016-01-15 MED ORDER — CEPHALEXIN 500 MG PO CAPS: 500.0000 mg | ORAL_CAPSULE | Freq: Two times a day (BID) | ORAL | 0 refills | Status: DC

## 2016-01-15 NOTE — Telephone Encounter (Signed)
May take Keflex 500 mg BID X 7 days.  Pt is aware of potential side effects.

## 2016-01-15 NOTE — Telephone Encounter (Signed)
Kem Boroughs, [place], RN        Please check on her and see if any UA symptoms.   Previous Messages    ----- Message -----  From: Nunzio Cobbs, MD  Sent: 01/14/2016  8:14 PM  To: Kem Boroughs, FNP  Subject: Please have nurse check on patient's respons*   Hi Patty,   Please have the triage call to check on patient.   I don't expect the Macrobid will treat the Group B Strep well.  This may be a contaminant, but if she is still symptomatic, she can be treated with Keflex.  There is about a 10% chance of cross reactivity allergic reaction due to her PCN allergy.  I am not sure that this was actually a true allergic reaction from chart review.  As long as she did not have an anaphylactic reaction, Keflex can be used if needed.   Thanks,   Brook      Left message to call Verline Lema at [phone].

## 2016-01-15 NOTE — Telephone Encounter (Signed)
Patient is calling back to check the status of her request for medication to be sent to [place] [facility]. Patient status this pharmacy closes early so she would like to get this as soon as possible.

## 2016-01-15 NOTE — Telephone Encounter (Signed)
Spoke with patient. Patient states that she completed Macrobid yesterday and is still having lower back pain and urinary frequency. Denies fever or chills. Advised rx will need to be changed to Keflex for treatment of Group B Strep found in the urine since she is still having symptoms. Patient is agreeable. Patient is out of town and requests rx be sent to [facility] in [place].   Routing to Kem Boroughs, FNP for review and advise of Keflex dosage and length of treatment.

## 2016-01-15 NOTE — Telephone Encounter (Signed)
Rx for Keflex 500 mg BID x 7 days 0RF sent to pharmacy of choice. Left detailed message at number provided [phone], okay per ROI. Advised that rx has been sent to pharmacy of choice.  Routing to provider for final review. Patient agreeable to disposition. Will close encounter.

## 2016-01-28 ENCOUNTER — Ambulatory Visit (INDEPENDENT_AMBULATORY_CARE_PROVIDER_SITE_OTHER): Payer: 59

## 2016-01-28 VITALS — BP 112/70 | HR 74 | Resp 14 | Ht 64.0 in | Wt 132.8 lb

## 2016-01-28 DIAGNOSIS — R8290 Unspecified abnormal findings in urine: Secondary | ICD-10-CM | POA: Diagnosis not present

## 2016-01-28 NOTE — Progress Notes (Signed)
Patient here for repeat urine culture. Previous urine culture 0n 01/07/16 showed bacterial infection with strep.   Patient is feeling better today.  Routed to provider and encounter closed.

## 2016-01-29 ENCOUNTER — Other Ambulatory Visit: Payer: Self-pay | Admitting: Nurse Practitioner

## 2016-01-29 ENCOUNTER — Encounter: Payer: Self-pay | Admitting: Nurse Practitioner

## 2016-01-29 DIAGNOSIS — J320 Chronic maxillary sinusitis: Secondary | ICD-10-CM | POA: Diagnosis not present

## 2016-01-29 DIAGNOSIS — B370 Candidal stomatitis: Secondary | ICD-10-CM | POA: Diagnosis not present

## 2016-01-29 DIAGNOSIS — H6122 Impacted cerumen, left ear: Secondary | ICD-10-CM | POA: Diagnosis not present

## 2016-01-29 LAB — URINE CULTURE

## 2016-01-29 MED ORDER — NITROFURANTOIN MONOHYD MACRO 100 MG PO CAPS: ORAL_CAPSULE | ORAL | 0 refills | Status: DC

## 2016-01-29 NOTE — Progress Notes (Signed)
I sent this refill to [facility] and she wanted [facility] [address] - it was sent back through -but I think we need to call [facility] to cancel.  Evidently she does not want [facility] and maybe not [facility] listed.

## 2016-01-30 NOTE — Progress Notes (Signed)
Left message for patient to call Summer back.  

## 2016-02-04 NOTE — Progress Notes (Signed)
Left message on vm for patient to call me back.

## 2016-02-10 NOTE — Progress Notes (Signed)
Patient has never returned my call. Ok to close encounter?

## 2016-02-10 NOTE — Progress Notes (Signed)
May close the encounter.

## 2016-02-27 ENCOUNTER — Ambulatory Visit (INDEPENDENT_AMBULATORY_CARE_PROVIDER_SITE_OTHER): Payer: 59 | Admitting: Obstetrics & Gynecology

## 2016-02-27 ENCOUNTER — Encounter: Payer: Self-pay | Admitting: Obstetrics & Gynecology

## 2016-02-27 VITALS — BP 122/70 | HR 68 | Resp 16 | Ht 63.25 in | Wt 136.0 lb

## 2016-02-27 DIAGNOSIS — Z0000 Encounter for general adult medical examination without abnormal findings: Secondary | ICD-10-CM

## 2016-02-27 DIAGNOSIS — N393 Stress incontinence (female) (male): Secondary | ICD-10-CM

## 2016-02-27 DIAGNOSIS — N951 Menopausal and female climacteric states: Secondary | ICD-10-CM | POA: Diagnosis not present

## 2016-02-27 DIAGNOSIS — Z124 Encounter for screening for malignant neoplasm of cervix: Secondary | ICD-10-CM | POA: Diagnosis not present

## 2016-02-27 DIAGNOSIS — Z01419 Encounter for gynecological examination (general) (routine) without abnormal findings: Secondary | ICD-10-CM

## 2016-02-27 LAB — POCT URINALYSIS DIPSTICK
Bilirubin, UA: NEGATIVE
Blood, UA: NEGATIVE
Glucose, UA: NEGATIVE
Ketones, UA: NEGATIVE
Leukocytes, UA: NEGATIVE
NITRITE UA: NEGATIVE
Protein, UA: NEGATIVE
Urobilinogen, UA: NEGATIVE
pH, UA: 5

## 2016-02-27 LAB — CBC
HEMATOCRIT: 46 % — AB (ref 35.0–45.0)
Hemoglobin: 15.3 g/dL (ref 11.7–15.5)
MCH: 32 pg (ref 27.0–33.0)
MCHC: 33.3 g/dL (ref 32.0–36.0)
MCV: 96.2 fL (ref 80.0–100.0)
MPV: 10.3 fL (ref 7.5–12.5)
Platelets: 239 10*3/uL (ref 140–400)
RBC: 4.78 MIL/uL (ref 3.80–5.10)
RDW: 12.7 % (ref 11.0–15.0)
WBC: 6.6 10*3/uL (ref 3.8–10.8)

## 2016-02-27 MED ORDER — NITROFURANTOIN MONOHYD MACRO 100 MG PO CAPS: ORAL_CAPSULE | ORAL | 0 refills | Status: DC

## 2016-02-27 NOTE — Progress Notes (Addendum)
51 y.o. [id number] MarriedCaucasianF here for annual exam.  Doing well.  Cycles over the last year have decreased in frequency.  Having some hot flashes and some night sweats.  Reports this is tolerable.    Having some SUI issues.  Hasn't really done PT.    H/o recurrent UTIs.  Patient is using macrobid for prophylaxis with intercourse.  Patient's last menstrual period was 01/24/2016.          Sexually active: Yes.    The current method of family planning is vasectomy.    Exercising: Yes.    cardio, weight training Smoker:  no  Health Maintenance: Pap:  01/16/14 negative, 11/15/12 negative, HR HPV negative  History of abnormal Pap:  no MMG:  06/11/15 BIRADS 1 negative  Colonoscopy:  02/24/12 polyp- repeat 10 years  BMD:   N/A TDaP:  11/15/12  Screening Labs: discuss today, Urine today: patient has loss of urine with sneezing or coughing   reports that she has never smoked. She has never used smokeless tobacco. She reports that she drinks about 2.4 oz of alcohol per week . She reports that she does not use drugs.  Past Medical History:  Diagnosis Date  . Arthritis    left great toe joint  . Colon polyps    benign  . Depression   . Dermoid cyst of ovary   . Eye disorder    like glaucoma-being followed by Dr Tyrone Schimke  . Fibrocystic breast   . IBS (irritable bowel syndrome)   . Plantar fasciitis    left foot 2/15-11/15  . Polyp of duodenum 2014   tiny  . Tendonitis    right knee 2/15-11/15    Past Surgical History:  Procedure Laterality Date  . WISDOM TOOTH EXTRACTION      Current Outpatient Prescriptions  Medication Sig Dispense Refill  . nitrofurantoin, macrocrystal-monohydrate, (MACROBID) 100 MG capsule 1 capsule post coital (Patient not taking: Reported on 02/27/2016) 30 capsule 0   No current facility-administered medications for this visit.     Family History  Problem Relation Age of Onset  . Leukemia Father     Myloma  . Diverticulitis Father   . Arthritis Mother    . Leukemia Maternal Uncle   . Arthritis Maternal Grandmother   . Arthritis Paternal Grandmother   . Colon cancer Neg Hx   . Colon polyps Neg Hx     ROS:  Pertinent items are noted in HPI.  Otherwise, a comprehensive ROS was negative.  Exam:   BP 122/70 (BP Location: Right Arm, Patient Position: Sitting, Cuff Size: Normal)   Pulse 68   Resp 16   Ht 5' 3.25" (1.607 m)   Wt 136 lb (61.7 kg)   LMP 01/24/2016   BMI 23.90 kg/m     Height: 5' 3.25" (160.7 cm)  Ht Readings from Last 3 Encounters:  02/27/16 5' 3.25" (1.607 m)  01/28/16 5\' 4"  (1.626 m)  01/07/16 5\' 4"  (1.626 m)    General appearance: alert, cooperative and appears stated age Head: Normocephalic, without obvious abnormality, atraumatic Neck: no adenopathy, supple, symmetrical, trachea midline and thyroid normal to inspection and palpation Lungs: clear to auscultation bilaterally Breasts: normal appearance, no masses or tenderness Heart: regular rate and rhythm Abdomen: soft, non-tender; bowel sounds normal; no masses,  no organomegaly Extremities: extremities normal, atraumatic, no cyanosis or edema Skin: Skin color, texture, turgor normal. No rashes or lesions Lymph nodes: Cervical, supraclavicular, and axillary nodes normal. No abnormal inguinal nodes palpated Neurologic: Grossly  normal  Pelvic: External genitalia:  no lesions              Urethra:  normal appearing urethra with no masses, tenderness or lesions              Bartholins and Skenes: normal                 Vagina: normal appearing vagina with normal color and discharge, no lesions              Cervix: no lesions              Pap taken: Yes.   Bimanual Exam:  Uterus:  normal size, contour, position, consistency, mobility, non-tender              Adnexa: normal adnexa and no mass, fullness, tenderness               Rectovaginal: Confirms               Anus:  normal sphincter tone, no lesions  Chaperone was present for exam.  A:  Well woman  exam Perimenopasual bleeding  ([place] 2014 was 12, 2015 was 43) H/O recurrent UTIs H/O dermoid 5x66mm solid focuse followed on ultrasound for 7 years, PUS 015 was 70mm SUI, did not do PT last year  P:   Mammogram guidelines.  Grade 3 dense breasts.  3D MMG guidelines. Pap smear with HR HPV obtained today Has rx for macrobid for post coital prophylaxis of UTI FSH CBC, CMP, Lipids, TSH, Vit D Order for shingles vaccine given to pt.  She knows her insurance may not cover this. AEX 1 year or follow up prn

## 2016-02-27 NOTE — Patient Instructions (Signed)
Check about coverage for physical therapy.  Let me know if you want me to refer you again this year.

## 2016-02-28 LAB — COMPREHENSIVE METABOLIC PANEL
ALK PHOS: 53 U/L (ref 33–130)
ALT: 17 U/L (ref 6–29)
AST: 21 U/L (ref 10–35)
Albumin: 4.7 g/dL (ref 3.6–5.1)
BUN: 18 mg/dL (ref 7–25)
CALCIUM: 9.3 mg/dL (ref 8.6–10.4)
CO2: 26 mmol/L (ref 20–31)
CREATININE: 0.99 mg/dL (ref 0.50–1.05)
Chloride: 105 mmol/L (ref 98–110)
GLUCOSE: 56 mg/dL — AB (ref 65–99)
Potassium: 4.2 mmol/L (ref 3.5–5.3)
SODIUM: 141 mmol/L (ref 135–146)
Total Bilirubin: 0.4 mg/dL (ref 0.2–1.2)
Total Protein: 7.2 g/dL (ref 6.1–8.1)

## 2016-02-28 LAB — LIPID PANEL
Cholesterol: 207 mg/dL — ABNORMAL HIGH (ref <200)
HDL: 79 mg/dL (ref >50)
LDL CALC: 111 mg/dL — AB (ref <100)
Total CHOL/HDL Ratio: 2.6 Ratio (ref <5.0)
Triglycerides: 84 mg/dL (ref <150)
VLDL: 17 mg/dL (ref <30)

## 2016-02-28 LAB — FOLLICLE STIMULATING HORMONE: FSH: 69.6 m[IU]/mL

## 2016-02-28 LAB — TSH: TSH: 2.27 mIU/L

## 2016-02-28 LAB — VITAMIN D 25 HYDROXY (VIT D DEFICIENCY, FRACTURES): VIT D 25 HYDROXY: 35 ng/mL (ref 30–100)

## 2016-03-02 LAB — IPS PAP TEST WITH HPV

## 2016-04-20 ENCOUNTER — Telehealth: Payer: Self-pay | Admitting: Obstetrics & Gynecology

## 2016-04-20 ENCOUNTER — Encounter: Payer: Self-pay | Admitting: Obstetrics & Gynecology

## 2016-04-20 ENCOUNTER — Ambulatory Visit (INDEPENDENT_AMBULATORY_CARE_PROVIDER_SITE_OTHER): Payer: 59 | Admitting: Obstetrics & Gynecology

## 2016-04-20 VITALS — BP 94/66 | HR 78 | Resp 16 | Ht 63.25 in | Wt 135.0 lb

## 2016-04-20 DIAGNOSIS — N950 Postmenopausal bleeding: Secondary | ICD-10-CM | POA: Diagnosis not present

## 2016-04-20 NOTE — Progress Notes (Signed)
GYNECOLOGY  VISIT   HPI: 51 y.o. [id number] [name] here with complaint of bleeding that started three days ago.  It is not heavy but is bright red.  She's had minimal cramping.  Denies clots.  Did have some light bleeding in January.  [place] in Feb was 6.  Denies pelvic pain.  Is traveling to [facility] this week for work.  Going to a shoe show.  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  GYNECOLOGIC HISTORY: Patient's last menstrual period was 01/24/2016. Contraception: [place] 69 Menopausal hormone therapy:none  Patient Active Problem List   Diagnosis Date Noted  . Upper back pain on right side 12/03/2015  . Lateral epicondylitis of right elbow 04/09/2015  . Lateral epicondylitis of left elbow 05/10/2014  . Right leg pain 12/21/2013  . Plantar fasciitis of left foot 07/20/2013  . Patellar tendinitis of right knee 07/20/2013  . Left Achilles tendinitis 11/08/2012  . Pain in joint, pelvic region and thigh 03/08/2012  . 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 CELLULITIS AND ABSCESS OF UNSPECIFIED DIGIT 09/10/2009  . OTHER SYMPTOMS INVOLVING DIGESTIVE SYSTEM OTHER 07/25/2008  . ABDOMINAL PAIN -GENERALIZED 07/25/2008  . EPIGASTRIC PAIN 07/19/2008  . KNEE PAIN, RIGHT 06/08/2008  . TALIPES CAVUS 12/28/2007  . FOOT PAIN, CHRONIC 12/20/2007  . MUSCLE STRAIN, HAMSTRING MUSCLE 12/20/2007  . BENIGN NEOPLASM OF UNSPECIFIED SITE 12/13/2006  . URI 12/13/2006    Past Medical History:  Diagnosis Date  . Arthritis    left great toe joint  . Colon polyps    benign  . Depression   . Dermoid cyst of ovary   . Eye disorder    like glaucoma-being followed by Dr Tyrone Schimke  . Fibrocystic breast   . IBS (irritable bowel syndrome)   . Plantar fasciitis    left foot 2/15-11/15  . Polyp of duodenum 2014   tiny  . Tendonitis    right knee 2/15-11/15    Past Surgical History:  Procedure Laterality Date  . WISDOM TOOTH EXTRACTION      MEDS:  None  ALLERGIES:  Erythromycin ethylsuccinate and Penicillins  Family History  Problem Relation Age of Onset  . Leukemia Father     Myloma  . Diverticulitis Father   . Arthritis Mother   . Leukemia Maternal Uncle   . Arthritis Maternal Grandmother   . Arthritis Paternal Grandmother   . Colon cancer Neg Hx   . Colon polyps Neg Hx     SH:  Married, non smoker  Review of Systems  All other systems reviewed and are negative.   PHYSICAL EXAMINATION:    BP 94/66 (BP Location: Right Arm, Patient Position: Sitting, Cuff Size: Normal)   Pulse 78   Resp 16   Ht 5' 3.25" (1.607 m)   Wt 135 lb (61.2 kg)   LMP 01/24/2016   BMI 23.73 kg/m     General appearance: alert, cooperative and appears stated age Abdomen: soft, non-tender; bowel sounds normal; no masses,  no organomegaly  Pelvic: External genitalia:  no lesions              Urethra:  normal appearing urethra with no masses, tenderness or lesions              Bartholins and Skenes: normal                 Vagina: normal appearing vagina with normal color and discharge, no lesions  Cervix: no lesions              Bimanual Exam:  Uterus:  normal size, contour, position, consistency, mobility, non-tender              Adnexa: no mass, fullness, tenderness              Rectovaginal: No.              Anus:  No lesions  Endometrial biopsy recommended.  Discussed with patient.  Verbal and written consent obtained.   Procedure:  Speculum placed.  Cervix visualized and cleansed with betadine prep.  A single toothed tenaculum was applied to the anterior lip of the cervix.  Endometrial pipelle was advanced through the cervix into the endometrial cavity without difficulty.  Pipelle passed to 7cm.  Suction applied and pipelle removed with good tissue sample obtained.  Tenculum removed.  No bleeding noted.  Patient tolerated procedure well.   Chaperone was present for exam.  Assessment: PMP bleeding No HRT FSH 68  Plan: Endometrial biopsy  obtained today.  Results will be called to pt and additional recommendations will be made at that time.

## 2016-04-20 NOTE — Telephone Encounter (Signed)
Patient having some bleeding this is day 3.  She says last time she saw dr [name] Heck she told her she should be done bleeding.

## 2016-04-20 NOTE — Telephone Encounter (Signed)
Call to patient. Patient states that she is on day 3 of bleeding. LMP noted was 01/24/16 and patient states she has had "zero bleeding" since her last appointment with Dr. [name] Heck. States Dr. [name] Heck checked her blood work, and told her she likely would not have anymore bleeding, but to call office she did. Patient states, "I feel like I'm having a regular cycle." Patient states the bleeding started as discharge mixed with a ting of blood that she treated as a yeast infection with monistat, but that the bleeding has progressively become more and turned bright red. Office visit offered for further evaluation. Patient requests appointment today as she is leaving for [facility] tomorrow. Office visit scheduled for 1030 with Dr. [name] Heck. Patient agreeable.  Routing to provider for final review. Patient agreeable to disposition. Will close encounter.

## 2016-04-22 ENCOUNTER — Telehealth: Payer: Self-pay

## 2016-04-22 NOTE — Telephone Encounter (Signed)
-----   Message from Megan Salon, MD sent at 04/22/2016  6:09 AM EDT ----- Please notify pt her pathology from the biopsy showed no abnormal cells.  She needs to call if her bleeding does not stop within 7 days of starting and if she had future bleeding.  I would obtain an ultrasound at that time.  We discussed this when she was in the office.  Thanks.  CC:  Karmen Bongo

## 2016-04-22 NOTE — Telephone Encounter (Signed)
Left message to call Rowen Wilmer at [phone]. 

## 2016-04-22 NOTE — Telephone Encounter (Signed)
Spoke with patient. Advised of message as seen below from Dr.Miller. Patient is agreeable and verbalizes understanding.  Routing to provider for final review. Patient agreeable to disposition. Will close encounter   

## 2016-04-28 ENCOUNTER — Other Ambulatory Visit: Payer: Self-pay | Admitting: Obstetrics & Gynecology

## 2016-04-28 DIAGNOSIS — Z1231 Encounter for screening mammogram for malignant neoplasm of breast: Secondary | ICD-10-CM

## 2016-05-04 ENCOUNTER — Telehealth: Payer: Self-pay | Admitting: Obstetrics & Gynecology

## 2016-05-04 DIAGNOSIS — N950 Postmenopausal bleeding: Secondary | ICD-10-CM

## 2016-05-04 NOTE — Telephone Encounter (Signed)
Patient called and states that she is post menopausal and is bleeding again. The bleeding comes after sex.

## 2016-05-04 NOTE — Addendum Note (Signed)
Addended by: Burnice Logan on: 05/04/2016 01:46 PM   Modules accepted: Orders

## 2016-05-04 NOTE — Telephone Encounter (Signed)
Spoke with patient. Patient states her lab showed she was in menopause and now for the second time she has experienced bleeding about 8 hours after intercourse. Patient reports bleeding like a cycle, started 4/15. Patient denies and pain with intercourse or after. Patient states she discussed with Dr. [name] Heck PUS if bleeding was to happen again. Patient scheduled for PUS on 4/19 at 1pm with consult to follow at 1:30pm with Dr. [name] Heck. Advised patient would review with Dr. [name] Heck and return call with any additional recommendations, patient verbalizes understanding and is agreeable.  Order placed for PUS.  Routing to provider for final review. Patient is agreeable to disposition. Will close encounter.   Cc: Lerry Liner

## 2016-05-04 NOTE — Telephone Encounter (Signed)
Agree with this but should be a [facility] so I don't know if there is enough time for this.  May need to change appt time.  Thanks.

## 2016-05-04 NOTE — Telephone Encounter (Signed)
Spoke with patient, advised as seen below per Dr. [name] Heck. Patient rescheduled for [facility] on 05/07/16 at 2:30pm. Brief description of [place] explained to patient, questions answered. Patient verbalizes understanding and is agreeable.   New order placed for [facility].  Routing to provider for final review. Patient is agreeable to disposition. Will close encounter.  Cc: Lerry Liner

## 2016-05-06 ENCOUNTER — Telehealth: Payer: Self-pay | Admitting: Obstetrics & Gynecology

## 2016-05-06 NOTE — Telephone Encounter (Signed)
Ok to keep appointment for sonohysterogram.  Cc- Dr. [name] Heck

## 2016-05-06 NOTE — Telephone Encounter (Signed)
Spoke with patient. Patient states she was completing her check in on MyChart and states was advised not to have SHGM done while bleeding, would like clarification ok to proceed? Patient reports bleeding as a little more than spotting, but not enough for pad or tampon. Advised patient ok to keep [facility] appointment, no heavy bleeding. Advised patient SHGM is to evaluate PMB bleeding. Advised patient Dr. [name] Heck is out of the office today, can review with covering provider and return call if any additional recommendations. Patient verbalizes understanding and is agreeable.    Dr. Quincy Simmonds, ok to keep [facility] appointment?   Cc: Dr. [name] Heck

## 2016-05-06 NOTE — Telephone Encounter (Signed)
Patient is having some bleeding and has a SHGM scheduled tomorrow. Patient was completing her e-check in through [facility] and it stated not to have the procedure done while bleeding. Patient said that the reason she is having the [facility] is because she is bleeding. Patient is confused and would like clarification.

## 2016-05-07 ENCOUNTER — Other Ambulatory Visit: Payer: Self-pay

## 2016-05-07 ENCOUNTER — Ambulatory Visit (INDEPENDENT_AMBULATORY_CARE_PROVIDER_SITE_OTHER): Payer: 59

## 2016-05-07 ENCOUNTER — Other Ambulatory Visit: Payer: Self-pay | Admitting: Obstetrics & Gynecology

## 2016-05-07 ENCOUNTER — Ambulatory Visit (INDEPENDENT_AMBULATORY_CARE_PROVIDER_SITE_OTHER): Payer: 59 | Admitting: Obstetrics & Gynecology

## 2016-05-07 VITALS — BP 110/70 | HR 60 | Resp 16 | Ht 63.25 in

## 2016-05-07 DIAGNOSIS — C561 Malignant neoplasm of right ovary: Secondary | ICD-10-CM

## 2016-05-07 DIAGNOSIS — N950 Postmenopausal bleeding: Secondary | ICD-10-CM | POA: Diagnosis not present

## 2016-05-07 NOTE — Progress Notes (Signed)
51 y.o. Kimberly Stevens here for a pelvic ultrasound with sonohystogram due to PMP bleeding that started on 04/17/16 and was much like a cycle.  Pt had endometrial biopsy 04/20/16 showing benign proliferative endometrium.  [place] was 69.6 02/27/16.  Two prior levels were 27.7 on 03/29/14 and 58.5 on 01/16/14.  Cycles have been irregular over the past three years.  Over the past year, she has been skipping more cycles than in the prior years.  Since the endometrial biopsy on 04/20/16, she's had only spotting but just with intercourse.  Additional evaluation was recommended.    No LMP recorded. Patient is not currently having periods (Reason: Perimenopausal).  Contraception: vasectint  Technique:  Both transabdominal and transvaginal ultrasound examinations of the pelvis were performed. Transabdominal technique was performed for global imaging of the pelvis including uterus, ovaries, adnexal regions, and pelvic cul-de-sac.  It was necessary to proceed with endovaginal exam following the abdominal ultrasound transabdominal exam to visualize the endometrium and adnexa.  Color and duplex Doppler ultrasound was utilized to evaluate blood flow to the ovaries.   FINDINGS: Uterus: 7.8  X 5.9 x 3.3cm Endometrium: 3.16mm Adnexa:  Left: 2.1 x 1.1 x 0.9cm     Right: 2.6 x 1.4 x 1.4cm with 1.0 cyst and 1.0cm round lesion with internal low level echoes, avacular and echogenic rim.  Possible dermoid.  Cul de sac: no free fluid  SHSG:  After obtaining appropriate verbal consent from patient, the cervix was visualized using a speculum, and prepped with betadine.  A tenaculum  was applied to the cervix.  Attempted pass of IUI catheter was attempted several times without success.  Due to pt discomfort, procedure was ended.  Findings:  Pt has thin, symmetric endometrium without evidence of polyps or other abnormalities.  Endometrial biopsy at the time of what sounded like a menstrual cycle showed proliferative endometrium.   Pt does not want to be on any hormonal therapy if possible.  She is not having vaginal dryness.  Will plan to repeat [place].  If lower, will just monitor.  Will also need to repeat PUS due to change in ovarian finding.  Will check ca-125 as well.    Assessment: Perimenopausal irregular bleeding with possible fluctuating FSH 1.0cm avascular with low level echoes ovarian finding  Plan:   Repeat FSH today.  If lower, will just continue to monitor Ca-125 Repeat PUS in 3 months if this is normal.  ~20 minutes spent with patient >50% of time was in face to face discussion of above.

## 2016-05-08 LAB — FOLLICLE STIMULATING HORMONE: FSH: 46.2 m[IU]/mL

## 2016-05-10 ENCOUNTER — Encounter: Payer: Self-pay | Admitting: Obstetrics & Gynecology

## 2016-05-12 ENCOUNTER — Other Ambulatory Visit: Payer: Self-pay | Admitting: Obstetrics & Gynecology

## 2016-05-12 ENCOUNTER — Telehealth: Payer: Self-pay | Admitting: *Deleted

## 2016-05-12 DIAGNOSIS — C561 Malignant neoplasm of right ovary: Secondary | ICD-10-CM

## 2016-05-12 LAB — CA 125: CA 125: 11 U/mL (ref <35)

## 2016-05-12 MED ORDER — NONFORMULARY OR COMPOUNDED ITEM: 3 refills | Status: DC

## 2016-05-12 NOTE — Telephone Encounter (Signed)
Spoke with patient, advised as seen below per Dr. [name] Heck. Provided patient with [facility] contact information. Patient thankful for return call and verbalizes understanding.  Routing to provider for final review. Patient is agreeable to disposition. Will close encounter.

## 2016-05-12 NOTE — Telephone Encounter (Signed)
-----   Message from Megan Salon, MD sent at 05/12/2016 12:32 PM EDT ----- Please let pt know her [facility] is lower.  It was 69.6 and is now 46.  This drop could explain the bleeding.  I did add a tumor marker, ca-125, due to the finding on her ovary.  This is normal.  I'd like to repeat her PUS in 3 months to recheck her ovaries.  I believe we discussed using some suppositories in case this was causing some of her bleeding issues.  I can send in rx for her if she desires.  Thanks.

## 2016-05-12 NOTE — Telephone Encounter (Signed)
Return call to Jill. °

## 2016-05-12 NOTE — Telephone Encounter (Signed)
Spoke with patient. Patient states she would like to try Vitamin E suppository that was discussed. Patient scheduled for PUS on 08/20/16 at 12:30 pm with consult to follow at 1pm with Dr. [name] Heck. Advised patient would return call with RX information. Patient verbalizes understanding and is agreeable.  Order placed for PUS   Dr. [name] Heck please advise on suppository RX?  Cc: Lerry Liner

## 2016-05-12 NOTE — Telephone Encounter (Signed)
rx for vit E suppositories vaginally two to three times weekly [facility]  They will call when done and usually take two days to compound.

## 2016-05-12 NOTE — Telephone Encounter (Signed)
Left message to call Kimberly Stevens at [phone].  

## 2016-05-22 ENCOUNTER — Ambulatory Visit: Payer: BLUE CROSS/BLUE SHIELD | Admitting: Obstetrics & Gynecology

## 2016-05-28 ENCOUNTER — Ambulatory Visit (INDEPENDENT_AMBULATORY_CARE_PROVIDER_SITE_OTHER): Payer: 59 | Admitting: Sports Medicine

## 2016-05-28 DIAGNOSIS — M722 Plantar fascial fibromatosis: Secondary | ICD-10-CM

## 2016-05-28 DIAGNOSIS — M2022 Hallux rigidus, left foot: Secondary | ICD-10-CM

## 2016-05-28 DIAGNOSIS — M205X2 Other deformities of toe(s) (acquired), left foot: Secondary | ICD-10-CM

## 2016-05-28 NOTE — Progress Notes (Signed)
  Kimberly Stevens - 51 y.o. female MRN [id number]  Date of birth: 02-13-1965  SUBJECTIVE:  Including CC & ROS.  CC: right plantar fasciitis and left great toe decreased ROM  Presents with right plantar fasciitis that has been bothering her for months.  It was improving with exercises and night splint, but she feels as though it is bothering her more recently.  She has pain more posterior to her usual plantar fascia as well.  It was only improved after kayaking in the [facility] with cold water and keeping her ankle in dorsiflexion for hours.  She denies numbness or tingling.  She complains that her left greater toe has decreased ROM.  Denies having much pain to the area.  Is wondering about ways to improve ROM.   ROS: No unexpected weight loss, fever, chills, swelling, instability, muscle pain, numbness/tingling, redness, otherwise see HPI   PMHx - Updated and reviewed.  Contributory factors include: Negative PSHx - Updated and reviewed.  Contributory factors include:  Negative FHx - Updated and reviewed.  Contributory factors include:  Negative Social Hx - Updated and reviewed. Contributory factors include: Negative Medications - reviewed   DATA REVIEWED: Previous office visits  PHYSICAL EXAM:  VS: BP:(!) 99/47  HR: bpm  TEMP: ( )  RESP:   HT:5\' 4"  (162.6 cm)   WT:135 lb (61.2 kg)  BMI:23.2 PHYSICAL EXAM: Gen: NAD, alert, cooperative with exam, well-appearing HEENT: clear conjunctiva,  CV:  no edema, capillary refill brisk, normal rate Resp: non-labored Skin: no rashes, normal turgor  Neuro: no gross deficits.  Psych:  alert and oriented  Ankle & Foot: No visible swelling, ecchymosis, erythema, ulcers, calluses, blister Arch: Normal w/o pes cavus or planus  Achilles tendon without nodules or tenderness No swelling of retrocalcaneal bursa +TTP at plantar fascia origin on right Full in plantarflexion, dorsiflexion, inversion, and eversion of the foot; but has decreased  flexion and extension of left great toe, other toes with normal ROM Strength: 5/5 in all directions. Sensation: intact Vascular: intact w/ dorsalis pedis & posterior tibialis pulses 2+  Limited Ultrasound of right foot:  Plantar fascia with thickening to 0.59cm.   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  Recommended still trying to use ice.  Gave stretching and strengthening programs.  Follow up if not improving.    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  Recommend gentle ROM exercises and staying active.

## 2016-05-31 NOTE — Assessment & Plan Note (Signed)
Re-educated on different exercises.  Recommended still trying to use ice.  Gave stretching and strengthening programs.  Follow up if not improving.

## 2016-05-31 NOTE — Assessment & Plan Note (Signed)
2/2 OA of joint.  Recommend gentle ROM exercises and staying active.

## 2016-06-10 ENCOUNTER — Ambulatory Visit
Admission: RE | Admit: 2016-06-10 | Discharge: 2016-06-10 | Disposition: A | Discharge status: Home / Self Care | Payer: 59 | Source: Ambulatory Visit | Attending: Obstetrics & Gynecology | Admitting: Obstetrics & Gynecology

## 2016-06-10 DIAGNOSIS — Z1231 Encounter for screening mammogram for malignant neoplasm of breast: Secondary | ICD-10-CM

## 2016-07-07 ENCOUNTER — Ambulatory Visit: Payer: Self-pay

## 2016-07-07 ENCOUNTER — Encounter: Payer: Self-pay | Admitting: Sports Medicine

## 2016-07-07 ENCOUNTER — Ambulatory Visit (INDEPENDENT_AMBULATORY_CARE_PROVIDER_SITE_OTHER): Payer: 59 | Admitting: Sports Medicine

## 2016-07-07 VITALS — BP 106/74 | Ht 63.5 in | Wt 136.0 lb

## 2016-07-07 DIAGNOSIS — M25551 Pain in right hip: Secondary | ICD-10-CM | POA: Diagnosis not present

## 2016-07-07 DIAGNOSIS — M7061 Trochanteric bursitis, right hip: Secondary | ICD-10-CM | POA: Diagnosis not present

## 2016-07-07 MED ORDER — METHYLPREDNISOLONE ACETATE 40 MG/ML IJ SUSP
40.0000 mg | Freq: Once | INTRAMUSCULAR | Status: AC
  Administered 2016-07-07: 40 mg via INTRA_ARTICULAR

## 2016-07-07 NOTE — Assessment & Plan Note (Signed)
This appears more like an overuse tendinopathy  Injection today  Range of motion exercises  Icing in the evenings  Recheck in 4-6 weeks if not resolving

## 2016-07-07 NOTE — Progress Notes (Signed)
CC: RT hip Pain  Patient has been doing fitness classes These included a type of kicking exercise She is also playing golf  She has had plantar fasciitis for 4 months and feels that in the morning she does limp on her right leg She wondered if that had triggered pain  However her right hip pain developed about 3 weeks agoafter doing the class with a lot of kicks Blisters on the outside of her right hip Hurts with going up steps or doing squats Hurts after getting up from a sitting position  Review of systems Morning pain in the right heel Wakens her from sleep if she rolls over onto that side No groin pain  Physical exam Pleasant female in no acute distress BP 106/74   Ht 5' 3.5" (1.613 m)   Wt 136 lb (61.7 kg)   BMI 23.71 kg/m   Right hip shows full range of motion Full flexion Testing of hip flexion, hip abduction and hip extension shows good strength Forward step up is not painful Lateral step up causes mild pain Posterior step up his painful  She walks without a limp  Ultrasound of right lateral hip  Tensor fascia lata looks normal Gluteus medius show some mild hypoechoic change There is some hypoechoic change underneath the tendon of the gluteus medius where it attaches to the greater trochanter Mild hypoechoic change at the attachment of the gluteus maximus to the greater trochanter Hip rotators appear normal Hypoechoic change is not clearly localized it looks more like tendon sheath swelling rather than a true bursitis  Impression:  Swelling and tenderness attachments of the greater trochanter demonstrated by hypoechoic change  Ultrasound and interpretation by Wolfgang Phoenix. Kora Groom, MD  Procedure:  Injection of Right greater trochanter Consent obtained and verified. Time-out conducted. Noted no overlying erythema, induration, or other signs of local infection. Skin prepped in a sterile fashion. Topical analgesic spray: Ethyl chloride. Completed without  difficulty.  We localized the area of swelling with ultrasound. After this was done we use of chloride spray and directed an injection to fan out along the greater trochanter Meds: 1 mL Solu-Medrol 40/ 4 mL lidocaine 1% Pain immediately improved suggesting accurate placement of the medication. Advised to call if fevers/chills, erythema, induration, drainage, or persistent bleeding.

## 2016-07-27 ENCOUNTER — Telehealth: Payer: Self-pay | Admitting: Sports Medicine

## 2016-07-27 NOTE — Telephone Encounter (Signed)
Patient inquiring about receiving a Rx for Ibuprofen for 800mg  for her plantar fasciitis and hip.  Patient is leaving Wednesday to travel internationally for 10 days and would like a higher dosage so she does not have to travel with as many pills   If able to prescribe, patient would [facility] [place] [facility] [address]

## 2016-07-28 ENCOUNTER — Other Ambulatory Visit: Payer: Self-pay | Admitting: *Deleted

## 2016-07-28 MED ORDER — IBUPROFEN 800 MG PO TABS: 800.0000 mg | ORAL_TABLET | Freq: Three times a day (TID) | ORAL | 1 refills | Status: DC | PRN

## 2016-08-11 ENCOUNTER — Encounter: Payer: Self-pay | Admitting: Sports Medicine

## 2016-08-11 ENCOUNTER — Ambulatory Visit (INDEPENDENT_AMBULATORY_CARE_PROVIDER_SITE_OTHER): Payer: 59 | Admitting: Sports Medicine

## 2016-08-11 DIAGNOSIS — M205X2 Other deformities of toe(s) (acquired), left foot: Secondary | ICD-10-CM

## 2016-08-11 DIAGNOSIS — M7061 Trochanteric bursitis, right hip: Secondary | ICD-10-CM | POA: Diagnosis not present

## 2016-08-11 DIAGNOSIS — M722 Plantar fascial fibromatosis: Secondary | ICD-10-CM

## 2016-08-11 DIAGNOSIS — M2022 Hallux rigidus, left foot: Secondary | ICD-10-CM

## 2016-08-11 NOTE — Assessment & Plan Note (Signed)
This improved dramatically with injection  Try to keep up exercises at least 3 times a week  Recheck if any problems

## 2016-08-11 NOTE — Assessment & Plan Note (Signed)
This has been slow but finally seems to be resolving  Keep up stretches and exercises [place] Sock Use orthotics when possible Okay to start increasing some activity

## 2016-08-11 NOTE — Assessment & Plan Note (Signed)
Try to maintain toe motion and warm water Voltaren cream as needed

## 2016-08-11 NOTE — Progress Notes (Signed)
Followup of right hip pain  Last visit patient had an injection of the right greater trochanter She had immediate relief After one week the pain had dropped from an 8 to about a 3 She has continued to progress her activity She is doing the strength exercises to work on her gluteus maximus and her gluteus medius Exercises are still somewhat hard but she feels much stronger She states that this feels almost normal  Left great continues to be painful at times We have documented arthritic change by ultrasound and hallux limitus  Right plantar fasciitis This is now much less painful Still some periodic stiffness or some mild pain with too much walking  Review of systems No redness or swelling at her MTP joints No sciatica No groin pain  Physical examination Pleasant white female in no acute distress BP 98/60   Ht 5\' 4"  (1.626 m)   Wt 136 lb (61.7 kg)   BMI 23.34 kg/m   Right hip shows full range of motion No palpable tenderness over the greater trochanter Strength testing reveals that the right is now equal to the left Both gluteus medius and gluteus maximus and return to normal strength  Plantar fascia palpation is now nontender  Left great toe has 20 of extension and 20 of flexion There is some spurring over the dorsal MTP joint

## 2016-08-14 ENCOUNTER — Ambulatory Visit (INDEPENDENT_AMBULATORY_CARE_PROVIDER_SITE_OTHER): Payer: 59 | Admitting: Family Medicine

## 2016-08-14 ENCOUNTER — Ambulatory Visit: Payer: Self-pay

## 2016-08-14 VITALS — BP 110/70 | Ht 64.0 in | Wt 136.0 lb

## 2016-08-14 DIAGNOSIS — S76302A Unspecified injury of muscle, fascia and tendon of the posterior muscle group at thigh level, left thigh, initial encounter: Secondary | ICD-10-CM | POA: Diagnosis not present

## 2016-08-14 MED ORDER — CYCLOBENZAPRINE HCL 5 MG PO TABS: 5.0000 mg | ORAL_TABLET | Freq: Every day | ORAL | 0 refills | Status: DC

## 2016-08-14 NOTE — Progress Notes (Signed)
Sports Medicine Clinic Note  Subjective:  Kimberly Stevens is a 51 year old female with a pmh of right plantar fasciitis and left achilles tendinitis who presents with 1 day h/o left posterior thigh pain.   Left Posterior Thigh Pain -Sudden onset of pain while at women's football camp last night at [place] pain while changing directions during shuffling exercise -Initial pain was posteromedial thigh which has persisted -She has tried NSAIDs and icing which have not improved pain -No prior injury or surgery -No numbness, weakness  Objective BP 110/70   Ht 5\' 4"  (1.626 m)   Wt 136 lb (61.7 kg)   BMI 23.34 kg/m   Physical Exam: Gen: no acute distress, comfortable Resp: no increased WOB CV: no CP, pulses intact MS: no anxiety, depression  Left Leg Exam Inspection: no gross swelling, erythema or deformity Palpation: TTP mid-way along semitendinosis, no palpable muscle strain. Not TTP along hamstring insertion. Mildly TTP along entirety of gluteus maximus ROM: extension and flexion limited to 30 degrees 2/2 pain Strength: knee flexion, extension 5/5 strength but limited 2/2 pain Gait: able to bear weight but unable to ambulate without crutches  Left Leg [place]: Hypoechoic changes along proximal and mid-muscle belly of hamstring. Mild hypoechoic changes at hamstring insertion but no signs of acute fracture or avulsion. Gluteus maximus and medius unremarkable.  Assessment/Plan  Left Hamstring Strain Acute left hamstring strain along semitendinosus. No signs of avulsion or complete tear.  -Advised BID icing, rest, and ordered compression sleeve -WBAT, provided crutches -F/u 1 week

## 2016-08-17 NOTE — Progress Notes (Signed)
[facility]: Attending Note: I have reviewed the chart, discussed wit the Sports Medicine Fellow. I agree with assessment and treatment plan as detailed in the [place] note. Mild hamstring strain. Compression, rest, WBAT with crutches. NO STRETCHING. Activity modification. I would like her to follow up one week to assess her level of pain and get a better idea of how long a course of rehab this will be for her. She is naturally very impatient to be back to activity and I cautioned her about doing too much too early. She is having a lot of muscle spasm so I gave her low dose cyclobenzaprine with precautions.

## 2016-08-20 ENCOUNTER — Other Ambulatory Visit: Payer: Self-pay

## 2016-08-20 ENCOUNTER — Other Ambulatory Visit: Payer: Self-pay | Admitting: Obstetrics & Gynecology

## 2016-08-25 ENCOUNTER — Encounter: Payer: Self-pay | Admitting: Obstetrics & Gynecology

## 2016-08-25 ENCOUNTER — Ambulatory Visit (INDEPENDENT_AMBULATORY_CARE_PROVIDER_SITE_OTHER): Payer: 59 | Admitting: Family Medicine

## 2016-08-25 ENCOUNTER — Encounter: Payer: Self-pay | Admitting: Family Medicine

## 2016-08-25 VITALS — BP 110/70 | Ht 64.0 in | Wt 136.0 lb

## 2016-08-25 DIAGNOSIS — S76319D Strain of muscle, fascia and tendon of the posterior muscle group at thigh level, unspecified thigh, subsequent encounter: Secondary | ICD-10-CM | POA: Diagnosis not present

## 2016-08-25 NOTE — Progress Notes (Signed)
    CHIEF COMPLAINT / HPI:   f/u LEFT hamstring injury.Used both crutches until Saturday, then one crutch and yesterday none. Pain with certain positions, sitting on toilet, climbing stairs. Has noticed new bruising. Pain overall is better. Muscle relaaxers have helped night pain.  REVIEW OF SYSTEMS:  No numbness or tingling LLE. No fever. Positive for bruising and pain as per HPI  PERTINENT  PMH / PSH: I have reviewed the patient's medications, allergies, past medical and surgical history, smoking status.  Pertinent findings that relate to today's visit / issues include: No personal hx DM Non smoker  OBJECTIVE:  Vital signs are reviewed.   GEN: WDWNNAD EXT: Left hamstring without defect. Strength 3-4/5 c/w 5/5 SKIN: no erythema. + mild ecchymoses in groin area and posterior hip. NEURO: intact sensation LLE distally to soft and sharp touch. Vasc: DP pulses @+ B=   ASSESSMENT / PLAN: Please see problem oriented charting for details

## 2016-08-25 NOTE — Patient Instructions (Addendum)
For one week (until August 15) do only  Normal as needed walking activities. No stretching etc Then Start the program below and see me in 2-3 weeks. No additional running or stretching or exercising until I see you back Hamstring curls: Start with 3 sets of 15 (no weight); Progress by 5 reps every 3 days until you reach 3 sets of 30; After 3 days at 3 sets of 30, add 2lb ankle weight at 3 sets of 10; Increase every 5 days by 5 reps. You may add 2lbs ankle weight once weekly. Hamstring swings- swing leg backwards and curl at the end of the swing. Follow same schedule as above. Hamstring running lunges- running lunge position means no more than 45 degrees of knee flexion and running motion. Follow same schedule as above.

## 2016-08-25 NOTE — Assessment & Plan Note (Signed)
Improving See AVS for instructions

## 2016-09-15 ENCOUNTER — Ambulatory Visit (INDEPENDENT_AMBULATORY_CARE_PROVIDER_SITE_OTHER): Payer: 59 | Admitting: Family Medicine

## 2016-09-15 ENCOUNTER — Encounter: Payer: Self-pay | Admitting: Family Medicine

## 2016-09-15 VITALS — BP 110/64 | Ht 64.0 in | Wt 136.0 lb

## 2016-09-15 DIAGNOSIS — S76319D Strain of muscle, fascia and tendon of the posterior muscle group at thigh level, unspecified thigh, subsequent encounter: Secondary | ICD-10-CM

## 2016-09-15 DIAGNOSIS — M722 Plantar fascial fibromatosis: Secondary | ICD-10-CM

## 2016-09-15 MED ORDER — NITROGLYCERIN 0.2 MG/HR TD PT24: MEDICATED_PATCH | TRANSDERMAL | 12 refills | Status: DC

## 2016-09-15 NOTE — Patient Instructions (Signed)
  Hamstring curls: Start with 3 sets of 15 (no weight); Progress by 5 reps every 3 days until you reach 3 sets of 30; After 3 days at 3 sets of 30, add 2lb ankle weight at 3 sets of 10; Increase every 5 days by 5 reps. You may add 2lbs ankle weight once weekly. Continue until you are at equivalent weight as uninjured side. Hamstring swings- swing leg backwards and curl at the end of the swing. Follow same schedule as above. Hamstring running lunges- running lunge position means no more than 45 degrees of knee flexion and running motion. Follow same schedule as above. Plyometrics: bound and land in running position; 3 sets of 15 level first; then up a rise; then down a rise; only advance when level is easy. No greater than 45 deg knee flexion. Stride drills- 10 x 75 yards; bounding but no more than 45 deg knee flexion. Level for 1 week; then add up hill for 1 week; then add down hill. Hop drills- 15 repetitive hops- 3 sets; compare injured to non injured leg Nordic hamstring exercises- stabilize body kneeling;1 set of 5-10 and progress slowly(Don't attempt if painful)  Cut patch into one - fourth pieces Place a one fourth piece of patch on  skin over affected area, changing to a new piece every 24 hours.  You may experience a headache during the first 1-2 days and maybe up to 2 weeks of using the patch; these should improve and go away. If you experience headaches after beginning nitroglycerin patch treatment, you may take your preferred over the counter pain medicine such as tylenol or advil as directed on the box. Another side effect of the nitroglycerin patch can be skin irritation  or rash related to the patch adhesive.   Please notify our office if you develop  severe headaches or rash, and stop the patch.  Please call our office with any questions or problems. Tendon healing with nitroglycerin patch may require 12 to 24 weeks depending on the extent of injury. Men should not use if taking  Viagra, Cialis, or Levitra as it may cause an unsafe lowering of the blood pressure..  Use with caution if you have migraines or rosacea.

## 2016-09-16 NOTE — Assessment & Plan Note (Signed)
Recurrence of issues, likely due to increased stress on right foot from left hamstring injury. I would stop the eccentric stretches. We will add nitroglycerin patch therapy. She has used it before for other issues. RTC if not improving over next few weeks. She has existing orthotics that are appropriate.

## 2016-09-16 NOTE — Assessment & Plan Note (Signed)
LEFT Improving. Discussed advancing program. Avoid hill walking. Avoid hi tension stationary cycle. OK to work with her trainer who is aware of injury

## 2016-09-16 NOTE — Progress Notes (Signed)
    CHIEF COMPLAINT / HPI:   Significant improvement. Up to 3 pund weight doing exercises we gave gher. Had some cramping pain while seated for a long time e other day. Has questions about return to activity Also having recurrence of her plantar fascia pain. Has been doing some "heel drops" on the stairs but that seems to be making it worse if anything. Has had problems with plantar fasciiitis before.  REVIEW OF SYSTEMS:  Pertinent review of systems: negative for fever or unusual weight change.Additionally see HPI.  PERTINENT  PMH / PSH: I have reviewed the patient's medications, allergies, past medical and surgical history, smoking status.  Pertinent findings that relate to today's visit / issues include: Nonsmoker No personal hx DM Prior hx plantar fasciitis  OBJECTIVE:  Vital signs are reviewed.  FEET: B normal arch. Mild TTP oriigin of rigt planntar fascia. No bruising HAMSTRING: Left has abut 80% of strength of right. No defect. NEURO: ntact sensation B LE Vasc: DP pulses 2+B= Gait: normal  ASSESSMENT / PLAN: Please see problem oriented charting for details

## 2016-09-17 DIAGNOSIS — Z23 Encounter for immunization: Secondary | ICD-10-CM | POA: Diagnosis not present

## 2016-09-24 ENCOUNTER — Ambulatory Visit (INDEPENDENT_AMBULATORY_CARE_PROVIDER_SITE_OTHER): Payer: 59

## 2016-09-24 ENCOUNTER — Encounter: Payer: Self-pay | Admitting: Obstetrics & Gynecology

## 2016-09-24 ENCOUNTER — Ambulatory Visit: Payer: 59 | Admitting: Obstetrics & Gynecology

## 2016-09-24 VITALS — BP 112/68 | HR 62 | Resp 14 | Ht 64.0 in | Wt 134.0 lb

## 2016-09-24 DIAGNOSIS — D4959 Neoplasm of unspecified behavior of other genitourinary organ: Secondary | ICD-10-CM

## 2016-09-24 DIAGNOSIS — C561 Malignant neoplasm of right ovary: Secondary | ICD-10-CM | POA: Diagnosis not present

## 2016-09-24 NOTE — Progress Notes (Signed)
51 y.o. G64P2002 [name] here for pelvic ultrasound due to possible enlarging echogenic ovarian cyst/foci on ultrasound 4/18 done due to irregular bleeding.  Pt has hx of echogenic foci on ovary, possible dermoid, back as far as 2005.  In records obtained from prior gynecologist, Dr. Cherylann Banas, ultrasound done 04/21/10 showed 5 x 81mm area on right ovary.  Patient's last menstrual period was 08/20/2016.  Contraception: vasectomy  Findings:  UTERUS: 5.6 x 3.5 x 3.6cm EMS:3.35mm ADNEXA: Left ovary: 2.0 x 1.4 x 1.0cm       Right ovary: 2.3 x 1.1 x 1.0cm with echogenic focus measuring 3 x 4 x 34mm.  This is stable from prior images (except one in 4/18 where possible cyst beside the are was making it appear enlarged.) CUL DE SAC: no free fluid  Discussion:  Findings reviewed with pt.  Images compared.  Prior report from Dr. Cherylann Banas reviewed.  Do not feel this needs to be followed as the appearance today is back as it was in prior images except for earlier this year.  Possible etiologies reviewed.  Questions answered.  Pt comfortable with plan.    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    ~15 minutes spent with patient >50% of time was in face to face discussion of above.

## 2016-10-08 ENCOUNTER — Encounter: Payer: Self-pay | Admitting: Family Medicine

## 2016-12-04 ENCOUNTER — Telehealth: Payer: Self-pay | Admitting: Internal Medicine

## 2016-12-04 NOTE — Telephone Encounter (Signed)
Very difficult to know why she is having left-sided pain without an evaluation. She Has not been seen in over 2 years. Multiple possibilities (urologic, gynecologic, GI, musculoskeletal...). Though it could be diverticulitis, she had no diverticulosis on her colonoscopy in 2014 which may mitigate against this diagnosis . I don't feel comfortable giving her antibiotics over the phone without an evaluation. Unfortunately it is late on Friday. If her problem worsens over the weekend, she should go to urgent care or the emergency room for evaluation. However, if stable please make her an office appointment early this week with me if I have an opening or an AP if I don't. Thanks.

## 2016-12-04 NOTE — Telephone Encounter (Signed)
Pt states she had been eating lots of raspberries and thinks she may be having some issues with diverticulitis. States her father has had issues with this in the past. She has been having left sided abdominal pain and has an area there that is very tender to touch. Does not know if she has had a fever but has had some chills off and on. She has been trying to follow a liquid diet and feels she is some better but know she is "not quite right." Pt wonders is she may need an antibiotic. Please advise.

## 2016-12-04 NOTE — Telephone Encounter (Signed)
Spoke with pt and she is aware of Dr. Blanch Media recommendations. Pt scheduled to see Tye Savoy NP 12/08/16@3pm . Pt aware of appt.

## 2016-12-07 DIAGNOSIS — H524 Presbyopia: Secondary | ICD-10-CM | POA: Diagnosis not present

## 2016-12-07 DIAGNOSIS — H40011 Open angle with borderline findings, low risk, right eye: Secondary | ICD-10-CM | POA: Diagnosis not present

## 2016-12-08 ENCOUNTER — Ambulatory Visit: Payer: 59 | Admitting: Nurse Practitioner

## 2016-12-15 ENCOUNTER — Ambulatory Visit: Payer: 59 | Admitting: Family Medicine

## 2016-12-22 DIAGNOSIS — M2022 Hallux rigidus, left foot: Secondary | ICD-10-CM | POA: Diagnosis not present

## 2017-01-05 DIAGNOSIS — L814 Other melanin hyperpigmentation: Secondary | ICD-10-CM | POA: Diagnosis not present

## 2017-01-05 DIAGNOSIS — Z86018 Personal history of other benign neoplasm: Secondary | ICD-10-CM | POA: Diagnosis not present

## 2017-01-05 DIAGNOSIS — D1801 Hemangioma of skin and subcutaneous tissue: Secondary | ICD-10-CM | POA: Diagnosis not present

## 2017-02-18 DIAGNOSIS — H02834 Dermatochalasis of left upper eyelid: Secondary | ICD-10-CM | POA: Diagnosis not present

## 2017-02-18 DIAGNOSIS — H02831 Dermatochalasis of right upper eyelid: Secondary | ICD-10-CM | POA: Diagnosis not present

## 2017-03-01 ENCOUNTER — Encounter: Payer: Self-pay | Admitting: Obstetrics & Gynecology

## 2017-03-01 ENCOUNTER — Ambulatory Visit: Payer: 59 | Admitting: Obstetrics & Gynecology

## 2017-03-01 ENCOUNTER — Other Ambulatory Visit: Payer: Self-pay

## 2017-03-01 VITALS — BP 100/64 | HR 60 | Resp 14 | Ht 63.0 in | Wt 139.0 lb

## 2017-03-01 DIAGNOSIS — N912 Amenorrhea, unspecified: Secondary | ICD-10-CM | POA: Diagnosis not present

## 2017-03-01 DIAGNOSIS — Z01419 Encounter for gynecological examination (general) (routine) without abnormal findings: Secondary | ICD-10-CM

## 2017-03-01 DIAGNOSIS — R350 Frequency of micturition: Secondary | ICD-10-CM | POA: Diagnosis not present

## 2017-03-01 DIAGNOSIS — N898 Other specified noninflammatory disorders of vagina: Secondary | ICD-10-CM

## 2017-03-01 DIAGNOSIS — Z0000 Encounter for general adult medical examination without abnormal findings: Secondary | ICD-10-CM | POA: Diagnosis not present

## 2017-03-01 LAB — POCT URINALYSIS DIPSTICK
BILIRUBIN UA: NEGATIVE
Blood, UA: NEGATIVE
Glucose, UA: NEGATIVE
KETONES UA: NEGATIVE
Leukocytes, UA: NEGATIVE
Nitrite, UA: NEGATIVE
PH UA: 5 (ref 5.0–8.0)
Protein, UA: NEGATIVE
UROBILINOGEN UA: 0.2 U/dL

## 2017-03-01 MED ORDER — NITROFURANTOIN MACROCRYSTAL 100 MG PO CAPS: ORAL_CAPSULE | ORAL | 1 refills | Status: DC

## 2017-03-01 NOTE — Progress Notes (Signed)
52 y.o. G34P2002 Married Caucasian F here for annual exam.  Has been about six months since she had a cycle.  Was having some hot flashes over the winter but this seems to have resolved.    Patient complaining of frequent urination.  Has been taking a lot of baths.  Has been having some vaginal itching as well.    Lots of transitions going on personally.  Feeling "blue" but does not want any treatment.    Patient's last menstrual period was 09/19/2016 (approximate).          Sexually active: Yes.    The current method of family planning is vasectomy.    Exercising: Yes.    weights, running, walking. Smoker:  No.  Health Maintenance: Pap:  02/27/16 normal, HR HPV negative, 01/16/14 negative  History of abnormal Pap:  no MMG:  06/11/16 BIRADS 1 negative, doing 3D Colonoscopy:  02/24/12 polyps- repeat 10 years  BMD:   n/a TDaP:  11/15/12  Pneumonia vaccine(s):  n/a Shingrix:   n/a Hep C testing: not indicated  Screening Labs: 2/18  Urine today: Neg   reports that  has never smoked. she has never used smokeless tobacco. She reports that she drinks about 2.4 oz of alcohol per week. She reports that she does not use drugs.  Past Medical History:  Diagnosis Date  . Arthritis    left great toe joint  . Colon polyps    benign  . Depression   . Dermoid cyst of ovary   . Eye disorder    like glaucoma-being followed by Dr Tyrone Schimke  . Fibrocystic breast   . IBS (irritable bowel syndrome)   . Plantar fasciitis    left foot 2/15-11/15  . Polyp of duodenum 2014   tiny  . Tendonitis    right knee 2/15-11/15    Past Surgical History:  Procedure Laterality Date  . WISDOM TOOTH EXTRACTION      Current Outpatient Medications  Medication Sig Dispense Refill  . diclofenac sodium (VOLTAREN) 1 % GEL Apply 2 g topically as needed.     No current facility-administered medications for this visit.     Family History  Problem Relation Age of Onset  . Leukemia Father        Myloma  .  Diverticulitis Father   . Arthritis Mother   . Leukemia Maternal Uncle   . Arthritis Maternal Grandmother   . Arthritis Paternal Grandmother   . Colon cancer Neg Hx   . Colon polyps Neg Hx     ROS:  Pertinent items are noted in HPI.  Otherwise, a comprehensive ROS was negative.  Exam:   BP 100/64 (BP Location: Right Arm, Patient Position: Sitting, Cuff Size: Normal)   Pulse 60   Resp 14   Ht 5\' 3"  (1.6 m)   Wt 139 lb (63 kg)   LMP 09/19/2016 (Approximate)   BMI 24.62 kg/m   Weight change: +3#   Height: 5\' 3"  (160 cm)  Ht Readings from Last 3 Encounters:  03/01/17 5\' 3"  (1.6 m)  09/24/16 5\' 4"  (1.626 m)  09/15/16 5\' 4"  (1.626 m)    General appearance: alert, cooperative and appears stated age Head: Normocephalic, without obvious abnormality, atraumatic Neck: no adenopathy, supple, symmetrical, trachea midline and thyroid normal to inspection and palpation Lungs: clear to auscultation bilaterally Breasts: normal appearance, no masses or tenderness Heart: regular rate and rhythm Abdomen: soft, non-tender; bowel sounds normal; no masses,  no organomegaly Extremities: extremities normal, atraumatic, no  cyanosis or edema Skin: Skin color, texture, turgor normal. No rashes or lesions Lymph nodes: Cervical, supraclavicular, and axillary nodes normal. No abnormal inguinal nodes palpated Neurologic: Grossly normal   Pelvic: External genitalia:  no lesions              Urethra:  normal appearing urethra with no masses, tenderness or lesions              Bartholins and Skenes: normal                 Vagina: normal appearing vagina with normal color and whitish discharge noted, no lesions but atrophic appearance noted              Cervix: no lesions              Pap taken: No. Bimanual Exam:  Uterus:  normal size, contour, position, consistency, mobility, non-tender              Adnexa: normal adnexa and no mass, fullness, tenderness               Rectovaginal: Confirms                Anus:  normal sphincter tone, no lesions  Chaperone was present for exam.  A:  Well Woman with normal exam Perimenopausal without bleeding for about 6 months History of recurrent urinary tract infections History of 5 x 5 mm likely dermoid that has been followed on ultrasound for approximately 7 years.  We agreed with last ultrasound to only repeat imaging if patient has new onset of symptoms Stressors  P:   Mammogram guidelines reviewed.  She is doing 3D. pap smear not obtained today.  Negative Pap and negative high risk HPV were obtained 2/18 Urine culture pending.  Macrobid 100 mg postcoitally sent to pharmacy.  #30/1 refill Affirm obtained FSH, TSH, and CMP obtained today return annually or prn

## 2017-03-02 ENCOUNTER — Telehealth: Payer: Self-pay | Admitting: Family Medicine

## 2017-03-02 ENCOUNTER — Telehealth: Payer: Self-pay | Admitting: Obstetrics & Gynecology

## 2017-03-02 ENCOUNTER — Encounter: Payer: Self-pay | Admitting: Obstetrics & Gynecology

## 2017-03-02 ENCOUNTER — Encounter: Payer: Self-pay | Admitting: Family Medicine

## 2017-03-02 LAB — COMPREHENSIVE METABOLIC PANEL
A/G RATIO: 1.6 (ref 1.2–2.2)
ALBUMIN: 4.2 g/dL (ref 3.5–5.5)
ALT: 13 IU/L (ref 0–32)
AST: 16 IU/L (ref 0–40)
Alkaline Phosphatase: 64 IU/L (ref 39–117)
BILIRUBIN TOTAL: 0.3 mg/dL (ref 0.0–1.2)
BUN / CREAT RATIO: 13 (ref 9–23)
BUN: 12 mg/dL (ref 6–24)
CALCIUM: 9.2 mg/dL (ref 8.7–10.2)
CHLORIDE: 103 mmol/L (ref 96–106)
CO2: 21 mmol/L (ref 20–29)
Creatinine, Ser: 0.96 mg/dL (ref 0.57–1.00)
GFR, EST AFRICAN AMERICAN: 79 mL/min/{1.73_m2} (ref >59)
GFR, EST NON AFRICAN AMERICAN: 68 mL/min/{1.73_m2} (ref >59)
Globulin, Total: 2.6 g/dL (ref 1.5–4.5)
Glucose: 72 mg/dL (ref 65–99)
POTASSIUM: 4.7 mmol/L (ref 3.5–5.2)
Sodium: 142 mmol/L (ref 134–144)
TOTAL PROTEIN: 6.8 g/dL (ref 6.0–8.5)

## 2017-03-02 LAB — FOLLICLE STIMULATING HORMONE: FSH: 16.6 m[IU]/mL

## 2017-03-02 LAB — VAGINITIS/VAGINOSIS, DNA PROBE
Candida Species: NEGATIVE
Gardnerella vaginalis: POSITIVE — AB
TRICHOMONAS VAG: NEGATIVE

## 2017-03-02 LAB — TSH: TSH: 3.03 u[IU]/mL (ref 0.450–4.500)

## 2017-03-02 LAB — URINE CULTURE

## 2017-03-02 NOTE — Telephone Encounter (Signed)
Copied from [place]. Topic: General - Other >> Mar 02, 2017  3:17 PM Darl Householder, RMA wrote: Reason for CRM: patient has not been seen in 5 years by Dr. Elease Hashimoto and is requesting an appointment, will Dr. Elease Hashimoto be able to see her again

## 2017-03-02 NOTE — Telephone Encounter (Signed)
OK to set up 

## 2017-03-02 NOTE — Telephone Encounter (Signed)
-----   Message from [place], Generic sent at 03/02/2017 3:16 PM EST -----    I think I forgot to get the prescription for the shingles vaccination. Are you able to call that in to [facility] for me? Thank you for your time yesterday!    Dyann Ruddle

## 2017-03-02 NOTE — Telephone Encounter (Signed)
Yes.  Thank you.

## 2017-03-02 NOTE — Telephone Encounter (Signed)
Dr. [name] Heck  -ok to send Rx for Shingrix, Inject 0.5 mLs IM now, repeat in 2 - 6 months?   Order pended.

## 2017-03-03 MED ORDER — ZOSTER VAC RECOMB ADJUVANTED 50 MCG/0.5ML IM SUSR: INTRAMUSCULAR | 1 refills | Status: DC

## 2017-03-03 NOTE — Telephone Encounter (Signed)
Spoke with patient, advised [facility] [place]. Patient verbalizes understanding and is agreeable. Will close encounter.

## 2017-03-05 ENCOUNTER — Other Ambulatory Visit: Payer: Self-pay | Admitting: *Deleted

## 2017-03-05 ENCOUNTER — Telehealth: Payer: Self-pay | Admitting: *Deleted

## 2017-03-05 MED ORDER — METRONIDAZOLE 500 MG PO TABS: 500.0000 mg | ORAL_TABLET | Freq: Two times a day (BID) | ORAL | 0 refills | Status: DC

## 2017-03-05 NOTE — Telephone Encounter (Signed)
Left detailed message, name identified on voicemail, ok per current dpr.   Advised not urgent, testing positive for BV, Rx for Flagyl 500 mg po bid x7 days [facility]. Advised office is closed, return call to office on 2/18 to review all results.

## 2017-03-05 NOTE — Telephone Encounter (Signed)
-----   Message from Megan Salon, MD sent at 03/05/2017  4:22 PM EST ----- Please let pt know her CMP and TS were normal.  Urine culture was negative.  [place] was lower so she may have some bleeding.  If so, I'd just like to know.  Hot flashes had gotten better and this is probably why--body is making a little estrogen.  Vaginitis testing showed BV.  Ok to treat with Metrogel 2.58%, one applicator QHS x 5 nights OR flagyl 500mg  bid x 7 days.  If pt chooses oral medication, please advise no ETOH while on medication.  No additional follow up is needed if symptoms resolve.

## 2017-03-09 NOTE — Telephone Encounter (Signed)
Spoke with patient, reviewed all results and recommendations as seen below per Dr. [name] Heck. Patient verbalizes understanding and is agreeable.   Will close encounter.

## 2017-03-10 ENCOUNTER — Ambulatory Visit: Payer: 59 | Admitting: Family Medicine

## 2017-03-10 ENCOUNTER — Encounter: Payer: Self-pay | Admitting: Family Medicine

## 2017-03-10 VITALS — BP 102/68 | HR 74 | Temp 97.8°F | Wt 141.2 lb

## 2017-03-10 DIAGNOSIS — Z7189 Other specified counseling: Secondary | ICD-10-CM

## 2017-03-10 DIAGNOSIS — Z7185 Encounter for immunization safety counseling: Secondary | ICD-10-CM

## 2017-03-10 DIAGNOSIS — M19072 Primary osteoarthritis, left ankle and foot: Secondary | ICD-10-CM | POA: Diagnosis not present

## 2017-03-10 NOTE — Patient Instructions (Signed)
Check on coverage for Shingrix and let us know if interested

## 2017-03-10 NOTE — Progress Notes (Signed)
Subjective:     Patient ID: Kimberly Stevens, female   DOB: Jun 01, 1965, 52 y.o.   MRN: [id number]  HPI Patient is here to reestablish care. She is generally very healthy. She's had several orthopedic complaints and has seen sports medicine and orthopedics multiple times in recent years. She also sees gynecologist yearly for GYN checkups. She is getting regular mammograms and Pap smears. Colonoscopy up-to-date. She's not had new shingles vaccine and had questions regarding that.  Recent problems with progressive pain left metatarsophalangeal joint. She was told she had osteoarthritis and has limited mobility there. This has restricted her running and walking. She just had multiple labs done through gynecology and these were reviewed with no major concerns  Past Medical History:  Diagnosis Date  . Arthritis    left great toe joint  . Colon polyps    benign  . Depression   . Dermoid cyst of ovary   . Eye disorder    like glaucoma-being followed by Dr Tyrone Schimke  . Fibrocystic breast   . IBS (irritable bowel syndrome)   . Plantar fasciitis    left foot 2/15-11/15  . Polyp of duodenum 2014   tiny  . Tendonitis    right knee 2/15-11/15   Past Surgical History:  Procedure Laterality Date  . WISDOM TOOTH EXTRACTION      reports that  has never smoked. she has never used smokeless tobacco. She reports that she drinks about 2.4 oz of alcohol per week. She reports that she does not use drugs. family history includes Arthritis in her maternal grandmother, mother, and paternal grandmother; Diverticulitis in her father; Leukemia in her father and maternal uncle. No Known Allergies   Review of Systems  Constitutional: Negative for fatigue.  Eyes: Negative for visual disturbance.  Respiratory: Negative for cough, chest tightness, shortness of breath and wheezing.   Cardiovascular: Negative for chest pain, palpitations and leg swelling.  Endocrine: Negative for polydipsia and polyuria.   Musculoskeletal: Positive for arthralgias.  Neurological: Negative for dizziness, seizures, syncope, weakness, light-headedness and headaches.       Objective:   Physical Exam  Constitutional: She appears well-developed and well-nourished.  Eyes: Pupils are equal, round, and reactive to light.  Neck: Neck supple. No JVD present. No thyromegaly present.  Cardiovascular: Normal rate and regular rhythm. Exam reveals no gallop.  Pulmonary/Chest: Effort normal and breath sounds normal. No respiratory distress. She has no wheezes. She has no rales.  Musculoskeletal: She exhibits no edema.  Left foot reveals no erythema or warmth. She has very limited mobility left metatarsophalangeal joint  Neurological: She is alert.       Assessment:     #1 health maintenance. We discussed issues below  #2 osteoarthritis left MTP joint    Plan:     -she will check on insurance coverage for new shingles vaccine and let us know if interested -continue with yearly flu vaccine  -continue with at least every other year mammogram -avoidance of high heels which can exacerbate her foot pain.  Eulas Post MD [facility]

## 2017-04-01 ENCOUNTER — Telehealth: Payer: Self-pay | Admitting: Obstetrics & Gynecology

## 2017-04-01 NOTE — Telephone Encounter (Signed)
Spoke with patient. Reports rectal itching, burning and bleeding. No blood in stool, only notices scant amounts on toilet paper. Symptoms intermittent for the last few wks. Is unsure if hemorrhoids are present, treating with OTC preparationH, no changes. Warm baths help. Reports small, frequent bowel movements. Had a cold earlier in the week with diarrhea, states this has resolved.   Patient is out of town, will return on 3/19, OV scheduled for 3/21 at 1:15pm with Dr. [name] Heck. Patient declines earlier appt with covering provider. Advised to return call to office if earlier appt needed. Dr. [name] Heck will review, I will return call with a any additional recommendations.   Routing to provider for final review. Patient is agreeable to disposition. Will close encounter.

## 2017-04-01 NOTE — Telephone Encounter (Signed)
Agree with trying th OTC preparation products.  Could try a preparation H suppository as well.

## 2017-04-01 NOTE — Telephone Encounter (Signed)
Patient is having rectal itching and burning. Patient would like to see Dr.Miller.

## 2017-04-02 NOTE — Telephone Encounter (Signed)
Spoke with patient, advised as seen below per Dr. Miller. Patient verbalizes understanding and is agreeable.   

## 2017-04-07 NOTE — Progress Notes (Signed)
GYNECOLOGY  VISIT  CC:   Rectal itching  HPI: 52 y.o. G34P2002 [name] here for severe rectal itching that has been going on about three weeks.  Reports she's had intermittent issues with this for the last several years and she usually carries preparation H steroid cream with her that she uses when needed.  Typically this helps but this last "episode" has been the most severe thus far.  Today, however, she is feeling much better.    Travels for work and feels it is always worse with traveling.  Also, reports walking really irritates her.  Was in [place] last week and itching was so severe that she used moistened toilet paper to touch the skin to see if this would help.  It did not and, in fact, made the irritation worse.      She has had some bleeding when she it itching the skin when the symptoms are severe.  However, this is very rare.  Most recent colonoscopy was 02/24/12 and was negative.  Worse after the first bowel movement of the day and this is often in the afternoon.  She's wondered if a vitamin or supplement was contributing as she itches less on days when she does not have a bowel movement.  Takes a juice plus supplement.  Stopped this about three weeks ago but this hasn't made any difference with the itching.    Pt also is having some urinary frequency but she thinks this is more related to hormonal changes then to the rectal itching.  POCT today--urine--negative.    GYNECOLOGIC HISTORY: No LMP recorded. Patient is perimenopausal. Contraception: vasectomy Menopausal hormone therapy: none  Patient Active Problem List   Diagnosis Date Noted  . Hamstring strain, subsequent encounter 08/25/2016  . Plantar fasciitis of right foot 05/28/2016  . Acquired hallux limitus of left foot 05/28/2016  . Upper back pain on right side 12/03/2015  . Lateral epicondylitis of right elbow 04/09/2015  . Lateral epicondylitis of left elbow 05/10/2014  . Greater trochanteric bursitis  of right hip 12/21/2013  . Patellar tendinitis of right knee 07/20/2013  . Left Achilles tendinitis 11/08/2012  . Pain in joint, pelvic region and thigh 03/08/2012  . 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 OTHER SYMPTOMS INVOLVING DIGESTIVE SYSTEM OTHER 07/25/2008  . ABDOMINAL PAIN -GENERALIZED 07/25/2008  . EPIGASTRIC PAIN 07/19/2008  . KNEE PAIN, RIGHT 06/08/2008  . TALIPES CAVUS 12/28/2007  . FOOT PAIN, CHRONIC 12/20/2007  . BENIGN NEOPLASM OF UNSPECIFIED SITE 12/13/2006    Past Medical History:  Diagnosis Date  . Arthritis    left great toe joint  . Colon polyps    benign  . Depression   . Dermoid cyst of ovary   . Eye disorder    like glaucoma-being followed by Dr Tyrone Schimke  . Fibrocystic breast   . IBS (irritable bowel syndrome)   . Plantar fasciitis    left foot 2/15-11/15  . Polyp of duodenum 2014   tiny  . Tendonitis    right knee 2/15-11/15    Past Surgical History:  Procedure Laterality Date  . WISDOM TOOTH EXTRACTION      MEDS:   Current Outpatient Medications on File Prior to Visit  Medication Sig Dispense Refill  . diclofenac sodium (VOLTAREN) 1 % GEL Apply 2 g topically as needed.    . nitrofurantoin (MACRODANTIN) 100 MG capsule Takes 1 tablet after intercourse 30 capsule 1  . Zoster Vaccine Adjuvanted [facility]) injection Inject 0.5 mLs  IM now, repeat in 2 - 6 months. (Patient not taking: Reported on 04/08/2017) 0.5 mL 1   No current facility-administered medications on file prior to visit.     ALLERGIES: Patient has no known allergies.  Family History  Problem Relation Age of Onset  . Leukemia Father        Myloma  . Diverticulitis Father   . Arthritis Mother   . Leukemia Maternal Uncle   . Arthritis Maternal Grandmother   . Arthritis Paternal Grandmother   . Colon cancer Neg Hx   . Colon polyps Neg Hx     SH:  Married, non smoker  Review of Systems  Constitutional: Negative.   Gastrointestinal:       Rectal  itching  Genitourinary: Negative.     PHYSICAL EXAMINATION:    BP 104/64   Pulse 68   Resp 16   Ht 5\' 3"  (1.6 m)   Wt 138 lb (62.6 kg)   BMI 24.45 kg/m     General appearance: alert, cooperative and appears stated age Lymph:  No enlarged LAD  Pelvic: External genitalia:  no lesions              Urethra:  normal appearing urethra with no masses, tenderness or lesions              Bartholins and Skenes: normal                 Vagina: normal appearing vagina with normal color and discharge, no lesions              Rectovaginal: Yes.  .  No hemorrhoids.              Anus:  normal sphincter tone, no lesions  Chaperone was present for exam.  Assessment: Rectal itching  Plan: Rx for triamcinolone ointment 0.5% to be used BID with worsening symptoms.  Also rx for Atarax 10mg  nightly when having itching also prescribed.   Topical products she is using that could be contributing also discussed including soaps, detergents, fabric softeners, pads, toilet paper.  She is going to make changes to see if can figure out any of the cause.    With no visible skin changes today, do not feel a skin biopsy is going to add any additional information.   ~30 minutes spent with patient >50% of time was in face to face discussion of above.

## 2017-04-08 ENCOUNTER — Other Ambulatory Visit: Payer: Self-pay

## 2017-04-08 ENCOUNTER — Ambulatory Visit (INDEPENDENT_AMBULATORY_CARE_PROVIDER_SITE_OTHER): Payer: 59 | Admitting: Obstetrics & Gynecology

## 2017-04-08 ENCOUNTER — Encounter: Payer: Self-pay | Admitting: Obstetrics & Gynecology

## 2017-04-08 VITALS — BP 104/64 | HR 68 | Resp 16 | Ht 63.0 in | Wt 138.0 lb

## 2017-04-08 DIAGNOSIS — L290 Pruritus ani: Secondary | ICD-10-CM

## 2017-04-08 DIAGNOSIS — R350 Frequency of micturition: Secondary | ICD-10-CM | POA: Diagnosis not present

## 2017-04-08 LAB — POCT URINALYSIS DIPSTICK
Bilirubin, UA: NEGATIVE
Glucose, UA: NEGATIVE
Ketones, UA: NEGATIVE
LEUKOCYTES UA: NEGATIVE
NITRITE UA: NEGATIVE
PH UA: 5 (ref 5.0–8.0)
PROTEIN UA: NEGATIVE
RBC UA: NEGATIVE
UROBILINOGEN UA: NEGATIVE U/dL — AB

## 2017-04-08 MED ORDER — HYDROXYZINE HCL 10 MG PO TABS: 10.0000 mg | ORAL_TABLET | Freq: Every evening | ORAL | 0 refills | Status: DC | PRN

## 2017-04-08 MED ORDER — TRIAMCINOLONE ACETONIDE 0.5 % EX OINT: 1.0000 "application " | TOPICAL_OINTMENT | Freq: Two times a day (BID) | CUTANEOUS | 0 refills | Status: DC

## 2017-04-08 NOTE — Patient Instructions (Signed)
beaudreax's butt paste

## 2017-04-22 ENCOUNTER — Telehealth: Payer: Self-pay | Admitting: Obstetrics & Gynecology

## 2017-04-22 NOTE — Telephone Encounter (Signed)
Patient called to report she is still having rectal itching and is wondering  if she needs to see a Gastroenterologist?

## 2017-04-22 NOTE — Telephone Encounter (Signed)
Spoke with patient who called earlier today about vaginal bleeding and she forgot to let us know her rectal itching was not any better. She states she has changed laundry detergent, body soap, toilet paper and is taking antihistamine, triamcinolone cream along with Atarax and nothing is helping. She states it's worse with first BM of the day and itching is worse on left side of rectum. Will discuss with Dr. [name] Heck and call her back. Routed to provider

## 2017-04-22 NOTE — Telephone Encounter (Signed)
I think GI or dermatology is a good idea.  Probably would bee better with GI.  I think she's been seen in the past.

## 2017-04-22 NOTE — Telephone Encounter (Signed)
Dr.Miller any concern about vaginal bleeding from 1st message today?

## 2017-04-22 NOTE — Telephone Encounter (Signed)
Left message to call Kalyan Barabas at [phone].  

## 2017-04-22 NOTE — Telephone Encounter (Signed)
Patient is having some menstrual bleeding that is fairly heavy and started yesterday. She said she was called to report this if it happened.  LMP: 08/2016.  Last seen: 04/08/17

## 2017-04-22 NOTE — Telephone Encounter (Signed)
Spoke with patient. Reports bleeding like normal menses, changing super tampon q1-2 x/day, started 04/21/17. Last menses 08/2016. Denies any other symptoms, was advised to notify if any bleeding.   Last FSH 16.6 on 03/01/17, previously 46.2 on 05/07/16.   Advised patient will update Dr. [name] Heck, will return call with recommendations. Patient is agreeable.  Dr. [name] Heck -please review and advise?

## 2017-04-23 ENCOUNTER — Telehealth: Payer: Self-pay | Admitting: Internal Medicine

## 2017-04-23 NOTE — Telephone Encounter (Signed)
No concerns about bleeding.  She is perimenopausal and has been experiencing irregular bleeding.  [place] is not in full menopausal range with last test.  Thanks.

## 2017-04-23 NOTE — Telephone Encounter (Signed)
Spoke with patient and notified Dr.Miller not concerned about perimenopausal bleeding. Advised for her rectal itching she could see dermatologist or GI but GI would probably be better. She has somebody in mind and will call to make appointment. Will call back if can't appointment in reasonable time.

## 2017-04-27 ENCOUNTER — Telehealth: Payer: Self-pay | Admitting: Obstetrics & Gynecology

## 2017-04-27 NOTE — Telephone Encounter (Signed)
Left message to call Sharee Pimple at [phone].  Per review of Epic, patient scheduled with [facility]/Dr. Henrene Pastor on 06/07/17.

## 2017-04-27 NOTE — Telephone Encounter (Signed)
Please disregard previous message. Pt called back stating that she is happy with Dr. Henrene Pastor just wanted to follow her obgyn suggestions but she has changed her mind and wants to stay with Dr. Henrene Pastor.

## 2017-04-27 NOTE — Telephone Encounter (Signed)
It's ok to use Reese's pinworm treatment.  It's OTC and just follow the box instructions.  Contains pyrantel pamoate which is almost 100% effective in treatment for pinworms.

## 2017-04-27 NOTE — Telephone Encounter (Signed)
Patient would like referral to colon doctor for rectal itching. She was told she cannot be seen until June. Would also like to see if Dr. [name] Heck can do a swab to check for parasites until she has the appointment.

## 2017-04-28 ENCOUNTER — Other Ambulatory Visit: Payer: Self-pay | Admitting: Obstetrics & Gynecology

## 2017-04-28 ENCOUNTER — Encounter: Payer: Self-pay | Admitting: Physician Assistant

## 2017-04-28 ENCOUNTER — Ambulatory Visit: Payer: 59 | Admitting: Physician Assistant

## 2017-04-28 VITALS — BP 100/66 | HR 72 | Ht 63.25 in | Wt 138.0 lb

## 2017-04-28 DIAGNOSIS — R194 Change in bowel habit: Secondary | ICD-10-CM | POA: Diagnosis not present

## 2017-04-28 DIAGNOSIS — Z1231 Encounter for screening mammogram for malignant neoplasm of breast: Secondary | ICD-10-CM

## 2017-04-28 DIAGNOSIS — L290 Pruritus ani: Secondary | ICD-10-CM | POA: Diagnosis not present

## 2017-04-28 NOTE — Progress Notes (Signed)
Assessment and plans reviewed  

## 2017-04-28 NOTE — Progress Notes (Signed)
Chief Complaint: Rectal itching  HPI:    Kimberly Stevens is a 52 year old female with a past medical history as listed below, who follows with Dr. Henrene Pastor and presents to clinic today for a complaint of rectal itching.      Last OV 09/17/14 Dr. Henrene Pastor for recurrent abdominal pain.  02/24/12 EGD and colonoscopy.  Colonoscopy unremarkable.  A tiny non-adenomatous appearing polyp was removed.  Follow-up recommended in 10 years.  Upper endoscopy with nonedematous duodenal bulb but was otherwise normal.  Prescribed Levsin for possible spasm.    Today, explains that since March 15 she has experienced a change in her bowel habits, they are smaller in diameter and tend to be more sticky and hard to clean and she will have them throughout the day as opposed to before this when she would typically have one good bowel movement in the morning.  Related to that she has started with some anal itching which starts after a bowel movement.  This would occur very occasionally before now and she would use toilet water to wipe the area dry/clean but when she applies water to the area now it burns some.  She also sees some scant blood on the toilet paper when wiping from the irritation.  Describes that walking or going to her typical exercise classes makes this much worse.  Does admit to wearing Lycra often throughout the day.  Has been using Hydrocortisone cream, from PCP, every time she has a bowel movement over the past 5-6 days which helps about 30 minutes afterwards.  Describes it does not wake her from her sleep.  It is very irritating throughout the day.    Denies fever, chills, weight loss, anorexia, nausea, vomiting, heartburn, reflux, abdominal pain or rectal pain.    Past Medical History:  Diagnosis Date  . Arthritis    left great toe joint  . Colon polyps    benign  . Depression   . Dermoid cyst of ovary   . Eye disorder    like glaucoma-being followed by Dr Tyrone Schimke  . Fibrocystic breast   . IBS (irritable bowel  syndrome)   . Plantar fasciitis    left foot 2/15-11/15  . Polyp of duodenum 2014   tiny  . Tendonitis    right knee 2/15-11/15    Past Surgical History:  Procedure Laterality Date  . WISDOM TOOTH EXTRACTION      Current Outpatient Medications  Medication Sig Dispense Refill  . diclofenac sodium (VOLTAREN) 1 % GEL Apply 2 g topically as needed.    . hydrOXYzine (ATARAX/VISTARIL) 10 MG tablet Take 1 tablet (10 mg total) by mouth at bedtime as needed. Use when having skin itching issues. 30 tablet 0  . nitrofurantoin (MACRODANTIN) 100 MG capsule Takes 1 tablet after intercourse 30 capsule 1  . triamcinolone ointment (KENALOG) 0.5 % Apply 1 application topically 2 (two) times daily. 30 g 0  . Zoster Vaccine Adjuvanted [facility]) injection Inject 0.5 mLs IM now, repeat in 2 - 6 months. (Patient not taking: Reported on 04/08/2017) 0.5 mL 1   No current facility-administered medications for this visit.     Allergies as of 04/28/2017  . (No Known Allergies)    Family History  Problem Relation Age of Onset  . Leukemia Father        Myloma  . Diverticulitis Father   . Arthritis Mother   . Leukemia Maternal Uncle   . Arthritis Maternal Grandmother   . Arthritis Paternal Grandmother   .  Colon cancer Neg Hx   . Colon polyps Neg Hx     Social History   Socioeconomic History  . Marital status: Married    Spouse name: Not on file  . Number of children: Not on file  . Years of education: Not on file  . Highest education level: Not on file  Occupational History  . Not on file  Social Needs  . Financial resource strain: Not on file  . Food insecurity:    Worry: Not on file    Inability: Not on file  . Transportation needs:    Medical: Not on file    Non-medical: Not on file  Tobacco Use  . Smoking status: Never Smoker  . Smokeless tobacco: Never Used  Substance and Sexual Activity  . Alcohol use: Yes    Alcohol/week: 1.2 oz    Types: 2 Standard drinks or equivalent per  week  . Drug use: No  . Sexual activity: Yes    Partners: Male    Comment: Vasectomy  Lifestyle  . Physical activity:    Days per week: Not on file    Minutes per session: Not on file  . Stress: Not on file  Relationships  . Social connections:    Talks on phone: Not on file    Gets together: Not on file    Attends religious service: Not on file    Active member of club or organization: Not on file    Attends meetings of clubs or organizations: Not on file    Relationship status: Not on file  . Intimate partner violence:    Fear of current or ex partner: Not on file    Emotionally abused: Not on file    Physically abused: Not on file    Forced sexual activity: Not on file  Other Topics Concern  . Not on file  Social History Narrative  . Not on file    Review of Systems:    Constitutional: No weight loss, fever or chills Cardiovascular: No chest pain Respiratory: No SOB  Gastrointestinal: See HPI and otherwise negative   Physical Exam:  Vital signs: Ht 5' 3.25" (1.607 m) Comment: height measured without shoes  Wt 138 lb (62.6 kg)   BMI 24.25 kg/m    Constitutional:   Pleasant Caucasian female appears to be in NAD, Well developed, Well nourished, alert and cooperative Respiratory: Respirations even and unlabored. Lungs clear to auscultation bilaterally.   No wheezes, crackles, or rhonchi.  Cardiovascular: Normal S1, S2. No MRG. Regular rate and rhythm. No peripheral edema, cyanosis or pallor.  Gastrointestinal:  Soft, nondistended, nontender. No rebound or guarding. Normal bowel sounds. No appreciable masses or hepatomegaly. Rectal: External: Visible erythema around the anal opening, no anal fissure; internal:  stool in rectal vault; anoscopy: No visualized fissure, mass or hemorrhoids, there was stool in rectal vault (patient had just defecated) Psychiatric: Demonstrates good judgement and reason without abnormal affect or behaviors.  No recent labs or  imaging.  Assessment: 1.  Change in bowel habits: Towards slightly softer, stickier, messier stools, going from 1/day to now multiple small fragments, colonoscopy 2014, recommendations for repeat in 2024; Consider relation to IBS/diet versus other 2.  Pruritus ani: After bowel movements  Plan: 1.  Discussed with patient that the most important thing for her would be to keep the area dry.  Suggested using a cotton ball at her anal opening to help wick away moisture when she is wearing tight clothing. 2.  Underlying,  patient needs to have bulking of her stool in order to evacuate completely.  Recommend increasing fiber to 35 g/day through her diet + supplement.  Suggested Metamucil, Citrucel or Benefiber. 3.  Recommend patient start a probiotic such as Align over-the-counter for the next 2 months 4.  Discussed some dietary irritants including coffee, cola, beer, tomatoes, chocolate, tea or citrus fruits 5.  Would recommend patient try to not use her Hydrocortisone cream as this will keep the area wet and it does not seem to be resolving her problem. 6.  My nurse Katina Degree will call the patient in 1 week to see how she is doing.  If she has no improvement in stool or symptoms could discuss a colonoscopy, as she is somewhat concerned regarding the change in her bowel habits. 7.  Patient will follow with me in 3-4 weeks.  Ellouise Newer, PA-C [facility] 04/28/2017, 1:47 PM  Cc: Eulas Post, MD

## 2017-04-28 NOTE — Patient Instructions (Addendum)
If you are age 53 or older, your body mass index should be between 23-30. Your Body mass index is 24.25 kg/m. If this is out of the aforementioned range listed, please consider follow up with your Primary Care Provider.  If you are age 66 or younger, your body mass index should be between 19-25. Your Body mass index is 24.25 kg/m. If this is out of the aformentioned range listed, please consider follow up with your Primary Care Provider.   Use Align Probitic.  Fiber supplement - 25-35 grams daily - You may try Mutamucil, Citrucel or Benefiber.  Place a cotton ball at the entrance of rectum to help wick away moisture.  Dry with blow dryer on cool or dab dry.  Follow up with Ellouise Newer, PA-C on May 19, 2017 at 2:15 pm.  Thank you for choosing me and [facility].   Ellouise Newer, PA-C

## 2017-04-28 NOTE — Telephone Encounter (Signed)
Spoke with patient. Advised as seen below per Dr. [name] Heck. Patient states is scheduled for with PA today at [facility] at 1:45pm. Patient will wait for evaluation at GI, thankful for return call.   Routing to provider for final review. Patient is agreeable to disposition. Will close encounter.

## 2017-05-05 ENCOUNTER — Telehealth: Payer: Self-pay

## 2017-05-05 NOTE — Telephone Encounter (Signed)
-----   Message from Jeoffrey Massed, RN sent at 04/28/2017  2:41 PM EDT -----   ----- Message ----- From: Lowell Guitar, RMA Sent: 04/28/2017   2:38 PM To: Jeoffrey Massed, RN  Per Anderson Malta call patient in one week to check on her.

## 2017-05-05 NOTE — Telephone Encounter (Signed)
Very glad to hear that! JLL

## 2017-05-05 NOTE — Telephone Encounter (Signed)
The pt states she is doing much better and will call if her symptoms return.

## 2017-05-19 ENCOUNTER — Ambulatory Visit: Payer: 59 | Admitting: Physician Assistant

## 2017-05-27 ENCOUNTER — Ambulatory Visit: Payer: 59 | Admitting: Obstetrics & Gynecology

## 2017-06-07 ENCOUNTER — Ambulatory Visit: Payer: 59 | Admitting: Internal Medicine

## 2017-06-15 ENCOUNTER — Ambulatory Visit
Admission: RE | Admit: 2017-06-15 | Discharge: 2017-06-15 | Disposition: A | Discharge status: Home / Self Care | Payer: 59 | Source: Ambulatory Visit | Attending: Obstetrics & Gynecology | Admitting: Obstetrics & Gynecology

## 2017-06-15 DIAGNOSIS — Z1231 Encounter for screening mammogram for malignant neoplasm of breast: Secondary | ICD-10-CM | POA: Diagnosis not present

## 2017-06-24 ENCOUNTER — Other Ambulatory Visit: Payer: Self-pay | Admitting: Podiatry

## 2017-06-24 ENCOUNTER — Encounter: Payer: Self-pay | Admitting: Podiatry

## 2017-06-24 ENCOUNTER — Ambulatory Visit: Payer: 59 | Admitting: Podiatry

## 2017-06-24 ENCOUNTER — Telehealth: Payer: Self-pay | Admitting: *Deleted

## 2017-06-24 ENCOUNTER — Ambulatory Visit (INDEPENDENT_AMBULATORY_CARE_PROVIDER_SITE_OTHER): Payer: 59

## 2017-06-24 VITALS — BP 112/68 | HR 53 | Resp 16

## 2017-06-24 DIAGNOSIS — M79675 Pain in left toe(s): Secondary | ICD-10-CM

## 2017-06-24 DIAGNOSIS — M205X2 Other deformities of toe(s) (acquired), left foot: Secondary | ICD-10-CM

## 2017-06-24 NOTE — Progress Notes (Signed)
   Subjective:    Patient ID: Kimberly Stevens, female    DOB: 1965/12/27, 52 y.o.   MRN: [id number]  HPI    Review of Systems  All other systems reviewed and are negative.      Objective:   Physical Exam        Assessment & Plan:

## 2017-06-24 NOTE — Patient Instructions (Signed)
Hallux Rigidus Hallux rigidus is a type of joint pain or joint disease (arthritis) that affects your big toe (hallux). This condition involves the joint that connects the base of your big toe to the main part of your foot (metatarsophalangeal joint). This condition can cause your big toe to become stiff, painful, and difficult to move. Symptoms may get worse with movement or in cold or damp weather. The condition also gets worse over time. What are the causes? This condition may be caused by having a foot that does not function the way that it should or has an abnormal shape (structural deformity). These foot problems can run in families (be hereditary). This condition can also be caused by:  Injury.  Overuse.  Certain inflammatory diseases, including gout and rheumatoid arthritis.  What increases the risk? This condition is more likely to develop in people who:  Have a foot bone (metatarsal) that is longer or higher than normal.  Have a family history of hallux rigidus.  Have previously injured their big toe.  Have feet that do not have a curve (arch) on the inner side of the foot. This may be called flat feet or fallen arches.  Turn their ankles in when they walk (pronation).  Have rheumatoid arthritis or gout.  Have to stoop down often at work.  What are the signs or symptoms? Symptoms of this condition include:  Big toe pain.  Stiffness and difficulty moving the big toe.  Swelling of the toe and surrounding area.  Bone spurs. These are bony growths that can form on the joint of the big toe.  A limp.  How is this diagnosed? This condition is diagnosed based on a medical history and physical exam. This may include X-rays. How is this treated? Treatment for this condition includes:  Wearing roomy, comfortable shoes that have a large toe box.  Putting orthotic devices in your shoes.  Pain medicines.  Physical therapy.  Icing the injured area.  Alternate  between putting your foot in cold water then warm water.  If your condition is severe, treatment may include:  Corticosteroid injections to relieve pain.  Surgery to remove bone spurs, fuse damaged bones together, or replace the entire joint.  Follow these instructions at home:  Take over-the-counter and prescription medicines only as told by your health care provider.  Do not wear high heels or other restrictive footwear. Wear comfortable, supportive shoes that have a large toe box.  Wear orthotics as told by your health care provider, if this applies.  Put your feet in cold water for 30 seconds, then in warm water for 30 seconds. Alternate between the cold and warm water for 5 minutes. Do this several times a day or as told by your health care provider.  If directed, apply ice to the injured area. ? Put ice in a plastic bag. ? Place a towel between your skin and the bag. ? Leave the ice on for 20 minutes, 2-3 times per day.  Do foot exercises as instructed by your health care provider or a physical therapist.  Keep all follow-up visits as told by your health care provider. This is important. Contact a health care provider if:  You notice bone spurs or growths on or around your big toe.  Your pain does not get better or it gets worse.  You have pain while resting.  You have pain in other parts of your body, such as your back, hip, or knee.  You start to limp.   This information is not intended to replace advice given to you by your health care provider. Make sure you discuss any questions you have with your health care provider. Document Released: 01/05/2005 Document Revised: 06/13/2015 Document Reviewed: 09/12/2014 Elsevier Interactive Patient Education  2018 [employer]vier Inc.  

## 2017-06-24 NOTE — Telephone Encounter (Signed)
"  I just made an appointment with you this morning for a possible surgery with Dr. Paulla Dolly on June 18.  I need to find another date.  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  I am wondering if he has any availability on Wednesday, the 19th or if he only does surgeries on Tuesday.  I'm sorry, I just did not remember I have that board meeting.  Soon after that date is preferable.  You can even text me if you like.  Thank you very much."

## 2017-06-25 ENCOUNTER — Encounter: Payer: Self-pay | Admitting: Podiatry

## 2017-06-25 ENCOUNTER — Ambulatory Visit (INDEPENDENT_AMBULATORY_CARE_PROVIDER_SITE_OTHER): Payer: 59 | Admitting: Podiatry

## 2017-06-25 DIAGNOSIS — M205X2 Other deformities of toe(s) (acquired), left foot: Secondary | ICD-10-CM | POA: Diagnosis not present

## 2017-06-25 NOTE — Patient Instructions (Signed)
Pre-Operative Instructions  Congratulations, you have decided to take an important step towards improving your quality of life.  You can be assured that the doctors and staff at [facility] will be with you every step of the way.  Here are some important things you should know:  1. Plan to be at the surgery center/hospital at least 1 (one) hour prior to your scheduled time, unless otherwise directed by the surgical center/hospital staff.  You must have a responsible adult accompany you, remain during the surgery and drive you home.  Make sure you have directions to the surgical center/hospital to ensure you arrive on time. 2. If you are having surgery at [facility] or [facility], you will need a copy of your medical history and physical form from your family physician within one month prior to the date of surgery. We will give you a form for your primary physician to complete.  3. We make every effort to accommodate the date you request for surgery.  However, there are times where surgery dates or times have to be moved.  We will contact you as soon as possible if a change in schedule is required.   4. No aspirin/ibuprofen for one week before surgery.  If you are on aspirin, any non-steroidal anti-inflammatory medications (Mobic, Aleve, Ibuprofen) should not be taken seven (7) days prior to your surgery.  You make take Tylenol for pain prior to surgery.  5. Medications - If you are taking daily heart and blood pressure medications, seizure, reflux, allergy, asthma, anxiety, pain or diabetes medications, make sure you notify the surgery center/hospital before the day of surgery so they can tell you which medications you should take or avoid the day of surgery. 6. No food or drink after midnight the night before surgery unless directed otherwise by surgical center/hospital staff. 7. No alcoholic beverages 24-hours prior to surgery.  No smoking 24-hours prior or 24-hours after  surgery. 8. Wear loose pants or shorts. They should be loose enough to fit over bandages, boots, and casts. 9. Don't wear slip-on shoes. Sneakers are preferred. 10. Bring your boot with you to the surgery center/hospital.  Also bring crutches or a walker if your physician has prescribed it for you.  If you do not have this equipment, it will be provided for you after surgery. 11. If you have not been contacted by the surgery center/hospital by the day before your surgery, call to confirm the date and time of your surgery. 12. Leave-time from work may vary depending on the type of surgery you have.  Appropriate arrangements should be made prior to surgery with your employer. 13. Prescriptions will be provided immediately following surgery by your doctor.  Fill these as soon as possible after surgery and take the medication as directed. Pain medications will not be refilled on weekends and must be approved by the doctor. 14. Remove nail polish on the operative foot and avoid getting pedicures prior to surgery. 15. Wash the night before surgery.  The night before surgery wash the foot and leg well with water and the antibacterial soap provided. Be sure to pay special attention to beneath the toenails and in between the toes.  Wash for at least three (3) minutes. Rinse thoroughly with water and dry well with a towel.  Perform this wash unless told not to do so by your physician.  Enclosed: 1 Ice pack (please put in freezer the night before surgery)   1 Hibiclens skin cleaner     Pre-op instructions  If you have any questions regarding the instructions, please do not hesitate to call our office.  [place]: [address], [place], [place] [address] -- [phone]  [place]: [address] [place], [place] [address] -- [phone]  [place]: [address]  [place], [place] [address] -- [phone]  [place]: [address], [address], [place], [place] [address] -- [phone]  Website:  [email or web address] 

## 2017-06-25 NOTE — Telephone Encounter (Signed)
Ms. Stotz came in this morning for her consultation with Dr. Paulla Dolly.  We will reschedule her surgery to July 2.   I called and left a message for Caren Griffins to move her surgery from June 18 to July 2.

## 2017-06-26 ENCOUNTER — Encounter: Payer: Self-pay | Admitting: Podiatry

## 2017-06-27 NOTE — Progress Notes (Signed)
Subjective:   Patient ID: Kimberly Stevens, female   DOB: 52 y.o.   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  I explained absolutely there is no guarantee this will solve her problem and that long-term possible fusion or implantation procedure will be necessary.  Patient wants surgery and signed consent form after extensive review and is given all preoperative instructions.  Understands total recovery will take 6 months to 1 year and I did dispense air fracture walker which I want her to get used to prior to surgery and also I want her to find a shoe that will be appropriate on the other foot.  Reappoint for surgical intervention in this case

## 2017-06-27 NOTE — Progress Notes (Signed)
Subjective:   Patient ID: Kimberly Stevens, female   DOB: 52 y.o.   MRN: [id number]   HPI Patient presents stating her left big toe is frozen and it is increasingly painful for her over the last year and she is had on and off problems with it for at least 5 years.  Patient presents with nonweightbearing x-ray views   Review of Systems  All other systems reviewed and are negative.       Objective:  Physical Exam  Constitutional: She appears well-developed and well-nourished.  Cardiovascular: Intact distal pulses.  Pulmonary/Chest: Effort normal.  Musculoskeletal: Normal range of motion.  Neurological: She is alert.  Skin: Skin is warm.  Nursing note and vitals reviewed.   Well oriented female who does not smoke and likes to be active who has discomfort with inflammation fluid around the first metatarsal head proximal phalanx left with reduced range of motion but does have motion that does not have crepitus or indications of advanced arthritic process.  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  At this point I do think there is a consideration to try to save this joint with plantarflexed 3 shortening osteotomy to open up the joint surface with possible osteochondral joint procedure and possible Cartee the implant.  I reviewed this at great length and I do think that it is possible this can be done but I did explain to absolutely that at one point in future this may require fusion or joint implantation procedure.  Patient would like to try to save the joint surface and is going to be scheduled for consult to discuss in further detail and is tentatively scheduled for  surgery  X-ray indicates that there is elongation elevation of the first metatarsal segment left but I did note a joint space that is narrowed but present which gives hope that we can save the joint surface

## 2017-07-01 ENCOUNTER — Telehealth: Payer: Self-pay | Admitting: *Deleted

## 2017-07-01 ENCOUNTER — Telehealth: Payer: Self-pay | Admitting: Podiatry

## 2017-07-01 NOTE — Telephone Encounter (Signed)
I'm calling because I left the disc of my x-rays from my orthopaedic surgeon when I was there Thursday 06 June or Friday 07 June. It has my name on it. I'm happy to pick it up. I cannot seem to find them so I'm thinking I left them there. You can reach me at [phone]. Thank you so much. Bye bye.

## 2017-07-01 NOTE — Telephone Encounter (Signed)
"  This is [employer].  I need to cancel my scheduled surgery with Dr. Paulla Dolly for July 2.  It's just not going to work out for me at this time.  I will drop the boot off back at the office later today and I want to make sure you got this message.  So, please just confirm with me that you got this message.  You can call me or send a message through [facility]."  I called and left Ms. Kishimoto a message that I received her message regarding canceling her surgery.  I told her I would cancel the surgery with the surgery center and I will let Dr. Paulla Dolly know.  I called Caren Griffins at the surgical center and canceled the surgery.

## 2017-07-14 ENCOUNTER — Other Ambulatory Visit: Payer: 59

## 2017-08-30 ENCOUNTER — Encounter: Payer: Self-pay | Admitting: *Deleted

## 2017-08-30 NOTE — Telephone Encounter (Signed)
This encounter was created in error - please disregard.

## 2017-08-30 NOTE — Telephone Encounter (Deleted)
Copied from [place] [id number]. Topic: General - Other >> Aug 30, 2017  2:50 PM Kimberly Stevens wrote: Reason for CRM:   Patient would like to know if Dr. Elease Stevens would take her 51yr old daughter Kimberly Stevens [birth year]) on as a new patient.  She's away at college and would only see the provider when she's home like for a physical. Patient expressed that her cell phone can be called or a message can be left in her mychart.

## 2017-09-10 ENCOUNTER — Encounter: Payer: Self-pay | Admitting: Obstetrics & Gynecology

## 2017-09-10 ENCOUNTER — Ambulatory Visit: Payer: 59 | Admitting: Obstetrics & Gynecology

## 2017-09-10 VITALS — BP 120/70 | HR 64 | Resp 16 | Ht 63.25 in | Wt 134.0 lb

## 2017-09-10 DIAGNOSIS — Z658 Other specified problems related to psychosocial circumstances: Secondary | ICD-10-CM | POA: Diagnosis not present

## 2017-09-10 MED ORDER — ESCITALOPRAM OXALATE 10 MG PO TABS: 10.0000 mg | ORAL_TABLET | Freq: Every day | ORAL | 1 refills | Status: DC

## 2017-09-10 NOTE — Progress Notes (Signed)
GYNECOLOGY  VISIT  CC:   Menopausal symptoms  HPI: 52 y.o. G18P2002 [name] here for behavioral problems due to menopause.  Feels like she wants to cry all of the time.  Feels more on edge than normal.  Having a lot of stressors right now.    Reports she just moved her son to [place].  [place] there.  He does not have a job yet.  He wants to work in movies.  Has graduated.  She feels he makes good decisions and she's not worried about him.    Daughter is going to [place] this fall.  She's a Furniture conservator/restorer.  She's at [employer].  Does not feel depressed, just tearful and anxious.  Is doing self care with exercise and being in the sun (which helps her).  Sometimes has difficulty sleeping.  Has taken some tylenol pm but did this too late and then felt groggy the next day.  Discussed timing for dosing if needs this.   Just another update--did have rectal itching that I saw her for a few months ago.  Ultimately saw GI.  Felt let regular BMs was part of the issue.  Now taking metamucil and a pro-biotic every day.  This has really helped with ore regular bowel movements and with the rectal itching.  This seems to have resolved.    Lastly, is having some vaginal dryness but hasn't use lubricants.  Information provided.    GYNECOLOGIC HISTORY: Patient's last menstrual period was 06/20/2017 (approximate). Contraception: post menopausal  Menopausal hormone therapy: none  Patient Active Problem List   Diagnosis Date Noted  . Hamstring strain, subsequent encounter 08/25/2016  . Plantar fasciitis of right foot 05/28/2016  . Acquired hallux limitus of left foot 05/28/2016  . Upper back pain on right side 12/03/2015  . Lateral epicondylitis of right elbow 04/09/2015  . Lateral epicondylitis of left elbow 05/10/2014  . Greater trochanteric bursitis of right hip 12/21/2013  . Patellar tendinitis of right knee 07/20/2013  . Left Achilles tendinitis 11/08/2012  . Pain in  joint, pelvic region and thigh 03/08/2012  . 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 OTHER SYMPTOMS INVOLVING DIGESTIVE SYSTEM OTHER 07/25/2008  . ABDOMINAL PAIN -GENERALIZED 07/25/2008  . EPIGASTRIC PAIN 07/19/2008  . KNEE PAIN, RIGHT 06/08/2008  . TALIPES CAVUS 12/28/2007  . FOOT PAIN, CHRONIC 12/20/2007  . BENIGN NEOPLASM OF UNSPECIFIED SITE 12/13/2006    Past Medical History:  Diagnosis Date  . Arthritis    left great toe joint  . Colon polyps    benign  . Depression   . Dermoid cyst of ovary   . Eye disorder    like glaucoma-being followed by Dr Tyrone Schimke  . Fibrocystic breast   . IBS (irritable bowel syndrome)   . Plantar fasciitis    left foot 2/15-11/15  . Polyp of duodenum 2014   tiny  . Tendonitis    right knee 2/15-11/15    Past Surgical History:  Procedure Laterality Date  . WISDOM TOOTH EXTRACTION      MEDS:   Current Outpatient Medications on File Prior to Visit  Medication Sig Dispense Refill  . Fexofenadine-Pseudoephedrine (ALLEGRA-D 12 HOUR PO) Take by mouth daily.    . Lactobacillus (PROBIOTIC ACIDOPHILUS) CAPS Take by mouth daily.    . Nutritional Supplements (JUICE PLUS FIBRE PO) Take by mouth daily.    . Psyllium (METAMUCIL FIBER PO) Take by mouth daily.    Marland Kitchen  diclofenac sodium (VOLTAREN) 1 % GEL Apply 2 g topically as needed.    . nitrofurantoin (MACRODANTIN) 100 MG capsule Takes 1 tablet after intercourse (Patient not taking: Reported on 09/10/2017) 30 capsule 1  . Zoster Vaccine Adjuvanted [facility]) injection Inject 0.5 mLs IM now, repeat in 2 - 6 months. (Patient not taking: Reported on 09/10/2017) 0.5 mL 1   No current facility-administered medications on file prior to visit.     ALLERGIES: Patient has no known allergies.  Family History  Problem Relation Age of Onset  . Leukemia Father        Myloma  . Diverticulitis Father   . Arthritis Mother   . Leukemia Maternal Uncle   . Arthritis Maternal Grandmother   .  Arthritis Paternal Grandmother   . Colon cancer Neg Hx   . Colon polyps Neg Hx     SH:  Married, non smoker  Review of Systems  Psychiatric/Behavioral:       Sadness   All other systems reviewed and are negative.   PHYSICAL EXAMINATION:    BP 120/70 (BP Location: Right Arm, Patient Position: Sitting, Cuff Size: Normal)   Pulse 64   Resp 16   Ht 5' 3.25" (1.607 m)   Wt 134 lb (60.8 kg)   LMP 06/20/2017 (Approximate)   BMI 23.55 kg/m     No exam performed   Assessment: Social stressors Mild vaginal dryness  Plan: Will start Lexapro 5mg  x 4 days and then increase to 10mg  daily.  She will give me an update in the next week or so to let me know how she is doing.  Side effects reviewed.     ~20 minutes spent with patient >50% of time was in face to face discussion of above.

## 2017-10-26 ENCOUNTER — Ambulatory Visit (INDEPENDENT_AMBULATORY_CARE_PROVIDER_SITE_OTHER): Payer: 59

## 2017-10-26 DIAGNOSIS — Z23 Encounter for immunization: Secondary | ICD-10-CM | POA: Diagnosis not present

## 2017-10-26 NOTE — Progress Notes (Signed)
Patient here today for Flu Vaccine. VIS given. Administered in let arm. Tolerated well

## 2017-10-26 NOTE — Patient Instructions (Signed)
Health Maintenance Due  Topic Date Due  . HIV Screening  08/17/1979  . PAP SMEAR  02/26/2017  . INFLUENZA VACCINE  08/19/2017    Depression screen [facility] 2/9 08/25/2016 06/25/2015 06/25/2015  Decreased Interest 0 0 0  Down, Depressed, Hopeless 0 0 0  PHQ - 2 Score 0 0 0

## 2017-11-25 ENCOUNTER — Ambulatory Visit: Payer: 59 | Admitting: Sports Medicine

## 2017-11-25 ENCOUNTER — Encounter: Payer: Self-pay | Admitting: Sports Medicine

## 2017-11-25 DIAGNOSIS — M19079 Primary osteoarthritis, unspecified ankle and foot: Secondary | ICD-10-CM

## 2017-11-25 DIAGNOSIS — M2022 Hallux rigidus, left foot: Secondary | ICD-10-CM

## 2017-11-25 DIAGNOSIS — M205X2 Other deformities of toe(s) (acquired), left foot: Secondary | ICD-10-CM

## 2017-11-25 MED ORDER — DICLOFENAC SODIUM 1 % TD GEL: 2.0000 g | TRANSDERMAL | 3 refills | Status: DC | PRN

## 2017-11-25 NOTE — Progress Notes (Signed)
  Kimberly Stevens - 52 y.o. female MRN [id number]  Date of birth: 05-27-1965    SUBJECTIVE:      Chief Complaint:/ HPI:  Patient presents for second opinion on possible bone spur surgery on her left first toe. Patient reports an injury to the foot from a dropped object years ago that may be related to her present complaint. She is an active woman and pain from the bone spur is interrupting her lifestyle. She is seeking ways to help her pain and avoid the procedure to remove the bone spur if necessary   Pst Hx;  Has used orthotics in past  ROS:     Pain noted with running No pain with walking Some relief with top. voltaren  PERTINENT  PMH / PSH FH / / SH:  Past Medical, Surgical, Social, and Family History Reviewed & Updated in the EMR.  OBJECTIVE: BP 124/69   Ht 5' 3.25" (1.607 m)   Wt 134 lb (60.8 kg)   BMI 23.55 kg/m   Physical Exam:  Vital signs are reviewed.  GEN: Alert and oriented, NAD Pulm: Breathing unlabored PSY: normal mood, congruent affect  MSK: Patient has decreased flexion and extension of her right and left first toe She has a flattened transverse arch and wide forefoot  There is tenderness to palpation of her left first toe There is a spur on dorsum of MTP 1 left  Ultrasound shows a prominent bone spur with fluid around the MTP joint Degenerative changes within the left first MTP joint.  RT is normal  ASSESSMENT & PLAN:  1. Hallux rigidus -use topical Voltaren -soak in warm water as much as possible -pertaining to her pain with swimming, it was advised to try taping the first and second toes of the left foot -continued activity is permitted, as swelling of the 1st MTP joint is chronic issue Should try using orthotics more to unload stress  If no improvement consider if surgery desired

## 2017-11-25 NOTE — Assessment & Plan Note (Signed)
Pain is common  Try top voltaren and use more consistently Work motion in warm water See note

## 2017-12-19 DIAGNOSIS — Z23 Encounter for immunization: Secondary | ICD-10-CM | POA: Diagnosis not present

## 2018-03-21 DIAGNOSIS — H40011 Open angle with borderline findings, low risk, right eye: Secondary | ICD-10-CM | POA: Diagnosis not present

## 2018-03-25 DIAGNOSIS — J320 Chronic maxillary sinusitis: Secondary | ICD-10-CM | POA: Diagnosis not present

## 2018-03-25 DIAGNOSIS — J040 Acute laryngitis: Secondary | ICD-10-CM | POA: Diagnosis not present

## 2018-03-25 DIAGNOSIS — J322 Chronic ethmoidal sinusitis: Secondary | ICD-10-CM | POA: Diagnosis not present

## 2018-04-01 ENCOUNTER — Ambulatory Visit: Payer: 59 | Admitting: Podiatry

## 2018-05-13 ENCOUNTER — Ambulatory Visit: Payer: 59 | Admitting: Obstetrics & Gynecology

## 2018-06-22 ENCOUNTER — Other Ambulatory Visit: Payer: Self-pay | Admitting: Obstetrics & Gynecology

## 2018-06-22 ENCOUNTER — Telehealth: Payer: Self-pay | Admitting: Obstetrics & Gynecology

## 2018-06-22 DIAGNOSIS — Z1231 Encounter for screening mammogram for malignant neoplasm of breast: Secondary | ICD-10-CM

## 2018-06-22 NOTE — Telephone Encounter (Signed)
Patient would like to discuss medication for menopausal symptoms.

## 2018-06-22 NOTE — Telephone Encounter (Signed)
Spoke with patient. Patient requesting OV to discuss HRT. Reports increase in hot flashes and not sleeping well. Last FSH 16.6 on 03/01/17, 46.2 on 05/07/16. Patient reports she did have bleeding 4 wks ago, had been 6 months since last menses. She is scheduled for screening MMG on 7/20. OV scheduled with Dr. [name] Heck on 6/8 at 3pm, AEX on 09/16/18 at 3:30pm. Patient verbalizes understanding.   Routing to provider for final review. Patient is agreeable to disposition. Will close encounter.

## 2018-06-23 ENCOUNTER — Other Ambulatory Visit: Payer: Self-pay

## 2018-06-27 ENCOUNTER — Encounter: Payer: Self-pay | Admitting: Obstetrics & Gynecology

## 2018-06-27 ENCOUNTER — Ambulatory Visit: Payer: 59 | Admitting: Obstetrics & Gynecology

## 2018-06-27 ENCOUNTER — Other Ambulatory Visit: Payer: Self-pay

## 2018-06-27 VITALS — BP 118/78 | HR 72 | Temp 97.8°F | Resp 14 | Ht 63.0 in | Wt 135.0 lb

## 2018-06-27 DIAGNOSIS — R61 Generalized hyperhidrosis: Secondary | ICD-10-CM | POA: Diagnosis not present

## 2018-06-27 DIAGNOSIS — F5101 Primary insomnia: Secondary | ICD-10-CM

## 2018-06-27 MED ORDER — PAROXETINE HCL 10 MG PO TABS: 10.0000 mg | ORAL_TABLET | ORAL | 2 refills | Status: DC

## 2018-06-27 MED ORDER — ZOLPIDEM TARTRATE 5 MG PO TABS: 5.0000 mg | ORAL_TABLET | Freq: Every evening | ORAL | 0 refills | Status: DC | PRN

## 2018-06-27 NOTE — Progress Notes (Signed)
GYNECOLOGY  VISIT  CC:   Night sweats  HPI: 53 y.o. G9P2002 Married White or Caucasian female here for HRT consult.  Having trouble with night sweats and sleep.  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  Both children and a boyfriend are living at home with her.  This is an added stress as well.  Is not cycling regularly.  Had [facility] tested in the past two years.  Has been almost 69 and then around 15.  Feeling more "menopausal" now.  Has friend who has recently started HRT and wants to discuss this.  Discussed with patient risks and benefits and specifically the WHI study including but not limited to risks of increased risks of heart disease, MI, stroke, DVT, and breast cancer.  Possibility of bleeding was discussed as patient does have a uterus.  Benefits of improved quality of life, improved bone density and decreased risks of colon cancer also discussed.  She does not like the idea of starting a cycle again.  Gabapentin and Paxil/SSRI use discussed including side effects with these medications.  As there are many stressors right now, she is most interested in trying Paxil.  Dosage for antidepressant vs dosage for menopausal symptoms discussed.  Will also use Ambien to start with for sleep aid.  Side effects discussed.   GYNECOLOGIC HISTORY: No LMP recorded (within months). Patient is perimenopausal. Contraception: vasectomy Menopausal hormone therapy: none  Patient Active Problem List   Diagnosis Date Noted  . Hamstring strain, subsequent encounter 08/25/2016  . Plantar fasciitis of right foot 05/28/2016  . Acquired hallux limitus of left foot 05/28/2016  . Upper back pain on right side 12/03/2015  . Lateral epicondylitis of right elbow 04/09/2015  . Lateral epicondylitis of left elbow 05/10/2014  . Greater trochanteric bursitis of right hip 12/21/2013  . Patellar tendinitis of right knee 07/20/2013  . Left Achilles tendinitis 11/08/2012  . Pain in joint, pelvic region and thigh  03/08/2012  . 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 OTHER SYMPTOMS INVOLVING DIGESTIVE SYSTEM OTHER 07/25/2008  . ABDOMINAL PAIN -GENERALIZED 07/25/2008  . EPIGASTRIC PAIN 07/19/2008  . KNEE PAIN, RIGHT 06/08/2008  . TALIPES CAVUS 12/28/2007  . FOOT PAIN, CHRONIC 12/20/2007  . BENIGN NEOPLASM OF UNSPECIFIED SITE 12/13/2006    Past Medical History:  Diagnosis Date  . Arthritis    left great toe joint  . Colon polyps    benign  . Depression   . Dermoid cyst of ovary   . Eye disorder    like glaucoma-being followed by Dr Tyrone Schimke  . Fibrocystic breast   . IBS (irritable bowel syndrome)   . Plantar fasciitis    left foot 2/15-11/15  . Polyp of duodenum 2014   tiny  . Tendonitis    right knee 2/15-11/15    Past Surgical History:  Procedure Laterality Date  . WISDOM TOOTH EXTRACTION      MEDS:   Current Outpatient Medications on File Prior to Visit  Medication Sig Dispense Refill  . azithromycin (ZITHROMAX) 500 MG tablet Take 500 mg by mouth daily.    . diclofenac sodium (VOLTAREN) 1 % GEL Apply 2 g topically as needed. 100 g 3  . Fexofenadine-Pseudoephedrine (ALLEGRA-D 12 HOUR PO) Take by mouth daily.    . Lactobacillus (PROBIOTIC ACIDOPHILUS) CAPS Take by mouth daily.    . Nutritional Supplements (JUICE PLUS FIBRE PO) Take by mouth daily.    . Psyllium (METAMUCIL FIBER PO) Take by mouth daily.    Marland Kitchen  escitalopram (LEXAPRO) 10 MG tablet Take 1 tablet (10 mg total) by mouth daily. (Patient not taking: Reported on 06/27/2018) 30 tablet 1  . Zoster Vaccine Adjuvanted [facility]) injection Inject 0.5 mLs IM now, repeat in 2 - 6 months. (Patient not taking: Reported on 09/10/2017) 0.5 mL 1   No current facility-administered medications on file prior to visit.     ALLERGIES: Patient has no known allergies.  Family History  Problem Relation Age of Onset  . Leukemia Father        Myloma  . Diverticulitis Father   . Arthritis Mother   . Leukemia Maternal  Uncle   . Arthritis Maternal Grandmother   . Arthritis Paternal Grandmother   . Colon cancer Neg Hx   . Colon polyps Neg Hx     SH:  Married, non smoker  Review of Systems  Constitutional: Negative.   HENT: Negative.   Eyes: Negative.   Respiratory: Negative.   Cardiovascular: Negative.   Gastrointestinal: Negative.   Endocrine: Positive for heat intolerance.       Hot flashes Night flashes  Genitourinary: Negative.   Musculoskeletal: Negative.   Skin: Negative.   Allergic/Immunologic: Negative.   Neurological: Negative.   Hematological: Negative.   Psychiatric/Behavioral: Negative.     PHYSICAL EXAMINATION:    BP 118/78 (BP Location: Right Arm, Patient Position: Sitting, Cuff Size: Normal)   Pulse 72   Temp 97.8 F (36.6 C) (Oral)   Resp 14   Ht 5\' 3"  (1.6 m)   Wt 135 lb (61.2 kg)   LMP  (Within Months) Comment: April 2020  BMI 23.91 kg/m     Physical Exam  Constitutional: She is oriented to person, place, and time. She appears well-developed and well-nourished.  Neurological: She is alert and oriented to person, place, and time.  Skin: Skin is warm and dry.  Psychiatric: She has a normal mood and affect.   Assessment: Hot flashes, night sweats, sleep disturbance Perimenopausal  Plan: Will start with Paxil 10mg  daily.  #30/2RF Ambien 5mg  nightly prn insomnia.  #30/0RF to pharmacy. Pt is going to call and give update in 2 weeks.    ~20 minutes spent with patient >50% of time was in face to face discussion of above.

## 2018-08-08 ENCOUNTER — Ambulatory Visit
Admission: RE | Admit: 2018-08-08 | Discharge: 2018-08-08 | Disposition: A | Discharge status: Home / Self Care | Payer: 59 | Source: Ambulatory Visit | Attending: Obstetrics & Gynecology | Admitting: Obstetrics & Gynecology

## 2018-08-08 ENCOUNTER — Other Ambulatory Visit: Payer: Self-pay

## 2018-08-08 DIAGNOSIS — Z1231 Encounter for screening mammogram for malignant neoplasm of breast: Secondary | ICD-10-CM

## 2018-09-16 ENCOUNTER — Ambulatory Visit: Payer: Self-pay | Admitting: Obstetrics & Gynecology

## 2018-09-29 NOTE — Progress Notes (Signed)
53 y.o. G49P2002 Married White or Caucasian female here for annual exam.  Doing well.  Denies vaginal bleeding.  She is never took lexapro.  Using white noise at night to help with sleep.  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    No LMP recorded. Patient is perimenopausal.           Sexually active: Yes.    The current method of family planning is vasectomy.  Exercising: Yes.   Smoker:  no  Health Maintenance: Pap:  02/27/2016 Negative, HPV Negative  01/16/2014 Negative History of abnormal Pap:  no MMG:  08/08/2018 BIRADS 1 Negative Density B  Colonoscopy:  02/24/2012 Polyps, repeat 10 years BMD:   n/a TDaP:  11/15/2012 Pneumonia vaccine(s):  no Shingrix:  Has completed one.  She didn't feel well after the first injection. Hep C testing: n/a Screening Labs: 2018 and 2019   reports that she has never smoked. She has never used smokeless tobacco. She reports current alcohol use of about 2.0 standard drinks of alcohol per week. She reports that she does not use drugs.  Past Medical History:  Diagnosis Date  . Arthritis    left great toe joint  . Colon polyps    benign  . Depression   . Dermoid cyst of ovary   . Eye disorder    like glaucoma-being followed by Dr Tyrone Schimke  . Fibrocystic breast   . IBS (irritable bowel syndrome)   . Plantar fasciitis    left foot 2/15-11/15  . Polyp of duodenum 2014   tiny  . Tendonitis    right knee 2/15-11/15    Past Surgical History:  Procedure Laterality Date  . WISDOM TOOTH EXTRACTION      Current Outpatient Medications  Medication Sig Dispense Refill  . diclofenac sodium (VOLTAREN) 1 % GEL Apply 2 g topically as needed. 100 g 3  . Fexofenadine-Pseudoephedrine (ALLEGRA-D 12 HOUR PO) Take by mouth daily.    . Lactobacillus (PROBIOTIC ACIDOPHILUS) CAPS Take by mouth daily.    . Nutritional Supplements (JUICE PLUS FIBRE PO) Take by mouth daily.    . Psyllium (METAMUCIL FIBER PO) Take by mouth daily.    Marland Kitchen azithromycin (ZITHROMAX) 500 MG  tablet Take 500 mg by mouth daily.    Marland Kitchen Zoster Vaccine Adjuvanted [facility]) injection Inject 0.5 mLs IM now, repeat in 2 - 6 months. (Patient not taking: Reported on 09/10/2017) 0.5 mL 1   No current facility-administered medications for this visit.     Family History  Problem Relation Age of Onset  . Leukemia Father        Myloma  . Diverticulitis Father   . Arthritis Mother   . Leukemia Maternal Uncle   . Arthritis Maternal Grandmother   . Arthritis Paternal Grandmother   . Colon cancer Neg Hx   . Colon polyps Neg Hx     Review of Systems  Constitutional: Negative.   HENT: Negative.   Eyes: Negative.   Respiratory: Negative.   Cardiovascular: Negative.   Gastrointestinal: Negative.   Endocrine: Negative.   Genitourinary: Negative.   Musculoskeletal: Negative.   Skin: Negative.   Allergic/Immunologic: Negative.   Neurological: Negative.   Hematological: Negative.   Psychiatric/Behavioral: Negative.     Exam:   BP 112/78 (BP Location: Left Arm, Patient Position: Sitting, Cuff Size: Normal)   Pulse 62   Temp (!) 97.4 F (36.3 C) (Temporal)   Ht 5\' 3"  (1.6 m)   Wt 136 lb 9.6 oz (62 kg)  BMI 24.20 kg/m   Height: 5\' 3"  (160 cm)  Ht Readings from Last 3 Encounters:  10/03/18 5\' 3"  (1.6 m)  06/27/18 5\' 3"  (1.6 m)  11/25/17 5' 3.25" (1.607 m)   General appearance: alert, cooperative and appears stated age Head: Normocephalic, without obvious abnormality, atraumatic Neck: no adenopathy, supple, symmetrical, trachea midline and thyroid normal to inspection and palpation Lungs: clear to auscultation bilaterally Breasts: normal appearance, no masses or tenderness Heart: regular rate and rhythm Abdomen: soft, non-tender; bowel sounds normal; no masses,  no organomegaly Extremities: extremities normal, atraumatic, no cyanosis or edema Skin: Skin color, texture, turgor normal. No rashes or lesions Lymph nodes: Cervical, supraclavicular, and axillary nodes normal. No  abnormal inguinal nodes palpated Neurologic: Grossly normal   Pelvic: External genitalia:  no lesions              Urethra:  normal appearing urethra with no masses, tenderness or lesions              Bartholins and Skenes: normal                 Vagina: normal appearing vagina with normal color and discharge, no lesions              Cervix: no lesions              Pap taken: No. Bimanual Exam:  Uterus:  normal size, contour, position, consistency, mobility, non-tender              Adnexa: normal adnexa and no mass, fullness, tenderness               Rectovaginal: Confirms               Anus:  normal sphincter tone, no lesions  Chaperone was present for exam.  A:  Well Woman with normal exam PMP, no HRT H/o recurrent UTIs Stressors  P:   Mammogram guidelines reviewed.  Doing 3D. pap smear with neg HR HPV 2018.  Guidelines reviewed.   Plan blood work next year Rx for macrobid 100mg  post coitally.  #30/1RF. Return annually or prn

## 2018-10-03 ENCOUNTER — Other Ambulatory Visit: Payer: Self-pay

## 2018-10-03 ENCOUNTER — Encounter: Payer: Self-pay | Admitting: Obstetrics & Gynecology

## 2018-10-03 ENCOUNTER — Ambulatory Visit: Payer: 59 | Admitting: Obstetrics & Gynecology

## 2018-10-03 VITALS — BP 112/78 | HR 62 | Temp 97.4°F | Ht 63.0 in | Wt 136.6 lb

## 2018-10-03 DIAGNOSIS — Z01419 Encounter for gynecological examination (general) (routine) without abnormal findings: Secondary | ICD-10-CM | POA: Diagnosis not present

## 2018-10-03 MED ORDER — NITROFURANTOIN MONOHYD MACRO 100 MG PO CAPS: ORAL_CAPSULE | ORAL | 1 refills | Status: DC

## 2018-12-13 ENCOUNTER — Other Ambulatory Visit: Payer: Self-pay

## 2018-12-13 DIAGNOSIS — Z20822 Contact with and (suspected) exposure to covid-19: Secondary | ICD-10-CM

## 2018-12-14 LAB — NOVEL CORONAVIRUS, NAA: SARS-CoV-2, NAA: NOT DETECTED

## 2019-03-02 ENCOUNTER — Ambulatory Visit: Payer: Self-pay | Admitting: Obstetrics & Gynecology

## 2019-05-16 ENCOUNTER — Ambulatory Visit: Payer: 59 | Admitting: Sports Medicine

## 2019-05-16 ENCOUNTER — Other Ambulatory Visit: Payer: Self-pay

## 2019-05-16 VITALS — BP 100/64 | Ht 64.0 in | Wt 134.0 lb

## 2019-05-16 DIAGNOSIS — M25511 Pain in right shoulder: Secondary | ICD-10-CM

## 2019-05-16 DIAGNOSIS — M67921 Unspecified disorder of synovium and tendon, right upper arm: Secondary | ICD-10-CM | POA: Insufficient documentation

## 2019-05-16 NOTE — Patient Instructions (Signed)
It was nice meeting you today Kimberly Stevens!  Your shoulder pain is primarily due to formation of the biceps tendon.  Please abstain from overhead exercise and ask your trainer for suggestions for substitutions.  We would like to rehab your shoulder with specific exercises called Jobe and scapular stabilizer exercises.  We are also going to get some x-rays and we will discussed the results with you when they return.  Please come back in about 3 weeks.  We will block off 30 minutes for a possible ultrasound if needed.  If you have any questions or concerns, please feel free to call the clinic.   Be well,  Dr. Micheline Chapman

## 2019-05-16 NOTE — Progress Notes (Addendum)
    SUBJECTIVE:   CHIEF COMPLAINT / HPI:   Right shoulder pain Ms. Morning reports that she has had shoulder pain for about 3 weeks.  She says that she had a radial head fracture as well as a distal radius fracture in January it was a sling for about 3 weeks.  She has also been doing upper body exercises including overhead presses and tricep extensions.  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  She says that her pain mostly bothers her when she wakes up in the morning.  It does not wake her up during the night.  She notices it when she is doing her upper body exercises and trying to lift anything, but she is able to do her daily activities.  She denies numbness or tingling of the upper extremity.  She has not yet tried anything to alleviate the pain.  PERTINENT  PMH / PSH: none  OBJECTIVE:   BP 100/64   Ht 5\' 4"  (1.626 m)   Wt 134 lb (60.8 kg)   BMI 23.00 kg/m   General: well appearing, appears stated age Shoulder, right: No evidence of bony deformity, asymmetry, or muscle atrophy; Tenderness is present over long head of biceps (bicipital groove).  Mildly TTP at [facility] joint. Full active and passive range of motion (180 flex Huel Cote /150Abd /90ER /70IR), Thumb to T12 without significant tenderness. Strength 5/5 throughout. No abnormal scapular function observed. Sensation intact. Peripheral pulses intact.  Special Tests:   - Crossarm test: NEG   - Empty can: NEG   - Hawkins: NEG   - Neer test: NEG   - Obrien's test: POS   - Speeds test: POS   ASSESSMENT/PLAN:   Biceps tendinopathy of right upper extremity Patient's tenderness of the biceps tendon and pain with arm flexion support this diagnosis.  It is possible that patient's prior radial fracture and immobilization could have contributed to her pain, and it is also possible that her overhead weight lifting  exercises have also contributed.  Patient was given home exercises for scapular stabilization and rotator cuff strengthening and advised to stop any overhead weight lifting.  Due to her history of recent fracture of the right upper extremity, we will obtain shoulder x-rays to ensure that she has no bony abnormality.  We will follow-up in 3 weeks and may conduct an ultrasound to further assess her rotator cuff if she continues to have symptoms.     Kathrene Alu, MD [place]   Patient seen and evaluated with the resident.  I agree with the above plan of care.  Patient has full right shoulder range of motion.  Good rotator cuff strength but some signs of impingement.  Definite tenderness to palpation of the proximal biceps tendon.  Would like to start with some Job and scapular stabilizer exercises.  She is instructed to avoid any sort of overhead strengthening exercise and follow-up with me again in 3 weeks.  If symptoms persist we will proceed with a right shoulder ultrasound at that time.  Addendum: X-rays show no evidence of fracture or arthritis.  Radiologist comments about possible mild AC separation but this does not fit her clinical picture.

## 2019-05-16 NOTE — Assessment & Plan Note (Signed)
Patient's tenderness of the biceps tendon and pain with arm flexion support this diagnosis.  It is possible that patient's prior radial fracture and immobilization could have contributed to her pain, and it is also possible that her overhead weight lifting exercises have also contributed.  Patient was given home exercises for scapular stabilization and rotator cuff strengthening and advised to stop any overhead weight lifting.  Due to her history of recent fracture of the right upper extremity, we will obtain shoulder x-rays to ensure that she has no bony abnormality.  We will follow-up in 3 weeks and may conduct an ultrasound to further assess her rotator cuff if she continues to have symptoms.

## 2019-05-18 ENCOUNTER — Ambulatory Visit
Admission: RE | Admit: 2019-05-18 | Discharge: 2019-05-18 | Disposition: A | Discharge status: Home / Self Care | Payer: 59 | Source: Ambulatory Visit | Attending: Sports Medicine | Admitting: Sports Medicine

## 2019-05-18 DIAGNOSIS — M67921 Unspecified disorder of synovium and tendon, right upper arm: Secondary | ICD-10-CM

## 2019-06-06 ENCOUNTER — Ambulatory Visit: Payer: 59 | Admitting: Sports Medicine

## 2019-06-06 ENCOUNTER — Other Ambulatory Visit: Payer: Self-pay

## 2019-06-06 VITALS — BP 102/70 | Ht 63.5 in | Wt 134.0 lb

## 2019-06-06 DIAGNOSIS — M25511 Pain in right shoulder: Secondary | ICD-10-CM | POA: Diagnosis not present

## 2019-06-06 MED ORDER — NITROGLYCERIN 0.2 MG/HR TD PT24: MEDICATED_PATCH | TRANSDERMAL | 1 refills | Status: DC

## 2019-06-06 NOTE — Progress Notes (Signed)
    SUBJECTIVE:   CHIEF COMPLAINT / HPI:   Kimberly Stevens is a 54 y.o. female who presents for follow-up for right shoulder pain.  Patient has a history of being an avid swimmer but has not been active since Covid.  Since she was last seen here she has avoided overhead presses and swimming.  She acknowledges noncompliance with home exercises for shoulder strengthening and stability.  She has had some improvement in her shoulder pain.    OBJECTIVE:   BP 102/70   Ht 5' 3.5" (1.613 m)   Wt 134 lb (60.8 kg)   BMI 23.36 kg/m    Right shoulder Inspection: No gross deformity or contusion Palpation: Tenderness to palpation over the [facility] joint ROM: Normal range of motion including flexion, extension, internal and external rotation Strength: 5 of 5 strength throughout Stability: Negative sulcus sign Special tests: Negative empty can test, negative cross the body, negative Michel Bickers, negative Neer's, negative speeds, negative Yergason Vascular studies: NVI    ASSESSMENT/PLAN:   Chronic right shoulder pain Likely overuse injury as evidenced by partial-thickness rotator cuff tear, bursitis, AC joint arthritis.  Collectively are probably causing patient's pain. -Nitroglycerin protocol with one quarter patch daily -Ibuprofen as needed -Shoulder exercises  Bonnita Hollow, MD [place]   Patient seen and evaluated with the resident.  I agree with the above plan of care.  Complete shoulder ultrasound today showed an area of irregularity in the mid substance of the supraspinatus tendon consistent with a probable partial-thickness tear in this location.  She also has mild subacromial bursitis and mild AC osteoarthritis.  She has responded well to nitroglycerin patches in the past for other injuries so we will start with a quarter patch daily and she will start physical therapy with Barbaraann Barthel.  Her goal is to return to swimming sometime in the near future.   However I do not want her swimming until follow-up with me.  She may return to strength training and other forms of conditioning but I have recommended that she avoid any repetitive overhead lifting or pulling in the gym.  She will follow up with me in 6 weeks for reevaluation and repeat limited ultrasound.  Call with questions or concerns in the interim.

## 2019-06-06 NOTE — Patient Instructions (Signed)

## 2019-07-13 ENCOUNTER — Other Ambulatory Visit: Payer: Self-pay

## 2019-07-13 ENCOUNTER — Ambulatory Visit: Payer: 59 | Admitting: Sports Medicine

## 2019-07-13 VITALS — BP 112/78 | Ht 64.0 in | Wt 134.0 lb

## 2019-07-13 DIAGNOSIS — M25511 Pain in right shoulder: Secondary | ICD-10-CM

## 2019-07-13 NOTE — Progress Notes (Signed)
    SUBJECTIVE:   CHIEF COMPLAINT / HPI:   Kimberly Stevens is a 54 y.o. female who presents for follow-up for right shoulder pain. She was last seen on 5/18, had a partial supraspinatus tear. She has been working with Jenny Reichmann O'[place]aron using nitroglycerin patches. She feels like the exercises are helping although her shoulder pain is about the same.  She reports that it has been difficult to activate her trapezius muscles during rehab as she has been very shoulder dominant her entire life.  She would like to try to get back into the pool if possible.  Nighttime pain has improved.    OBJECTIVE:   BP 112/78   Ht 5\' 4"  (1.626 m)   Wt 134 lb (60.8 kg)   BMI 23.00 kg/m    Right shoulder Inspection: No abnormalities Palpation: TTP over biceps tendon and lateral acromion ROM: Normal range of motion including flexion, extension, internal and external rotation Strength: 5 of 5 strength throughout NVI  Ultrasound: Supraspinatus: partial thickness mid substance tear, similar to previous scan    ASSESSMENT/PLAN:   Right partial-thickness supraspinatus tear-appears similar on ultrasound and patient reports a similar amount of pain with activity but her nighttime pain is improved.  We will continue with physical therapy with Andris Flurry and continue with nitroglycerin patches for 1 more month.  If she has no improvement at that time, will we will obtain an MRI.  Given her normal strength and range of motion in her shoulder, we both agree that continuing with conservative measures at this time is best.  She may try to do exercises in the pool but knows that she should avoid strokes and exercises that cause increased pain.   Jerolyn Shin, MD [facility] Fellow  Patient seen and evaluated with the sports medicine fellow.  I agree with the above plan of care.  Patient will continue with physical therapy for 4 more weeks.  She will continue with nitroglycerin patches as  well.  If she continues to struggle at follow-up then I will consider merits of further diagnostic imaging.  In the meantime, she is asking about the possibility of swimming.  I think she is okay to try but will stop if she has pain.

## 2019-08-07 ENCOUNTER — Other Ambulatory Visit: Payer: Self-pay

## 2019-08-07 ENCOUNTER — Encounter: Payer: Self-pay | Admitting: Family Medicine

## 2019-08-07 ENCOUNTER — Ambulatory Visit: Payer: 59 | Admitting: Family Medicine

## 2019-08-07 VITALS — BP 94/60 | HR 62 | Temp 97.8°F | Ht 64.0 in | Wt 140.3 lb

## 2019-08-07 DIAGNOSIS — R519 Headache, unspecified: Secondary | ICD-10-CM

## 2019-08-07 NOTE — Progress Notes (Signed)
Established Patient Office Visit  Subjective:  Patient ID: Kimberly Stevens, female    DOB: 07/05/1965  Age: 54 y.o. MRN: [id number]  CC:  Chief Complaint  Patient presents with  . Headache    x2-3 days, states she noticed pain in her eye also, back     HPI ILISA HAYWORTH presents for recent transient fleeting atypical one-sided headache.  She has history of small rotator cuff tear and has been using nitroglycerin patch which she started back last week.  She has had headaches in the past with using nitroglycerin patch.  Her current headache started few days ago.  She described this as being right retro-orbital and radiating occipitally.  This only lasted about a minute and was sharp quality.  Nonthrobbing.  She has history of ocular migraines but no recent visual symptoms.  She thinks this may be related to recent initiation of nitroglycerin patch.  She has had no headache today.  No focal weakness.  No seizure.  No confusion.  No exertional headaches.  No appetite or weight changes.  No sinusitis symptoms.  No skin rash.  Past Medical History:  Diagnosis Date  . Arthritis    left great toe joint  . Colon polyps    benign  . Depression   . Dermoid cyst of ovary   . Eye disorder    like glaucoma-being followed by Dr Tyrone Schimke  . Fibrocystic breast   . IBS (irritable bowel syndrome)   . Plantar fasciitis    left foot 2/15-11/15  . Polyp of duodenum 2014   tiny  . Tendonitis    right knee 2/15-11/15    Past Surgical History:  Procedure Laterality Date  . WISDOM TOOTH EXTRACTION      Family History  Problem Relation Age of Onset  . Leukemia Father        Myloma  . Diverticulitis Father   . Arthritis Mother   . Leukemia Maternal Uncle   . Arthritis Maternal Grandmother   . Arthritis Paternal Grandmother   . Colon cancer Neg Hx   . Colon polyps Neg Hx     Social History   Socioeconomic History  . Marital status: Married    Spouse name: Not on file  . Number of  children: Not on file  . Years of education: Not on file  . Highest education level: Not on file  Occupational History  . Not on file  Tobacco Use  . Smoking status: Never Smoker  . Smokeless tobacco: Never Used  Vaping Use  . Vaping Use: Never used  Substance and Sexual Activity  . Alcohol use: Yes    Alcohol/week: 2.0 standard drinks    Types: 2 Standard drinks or equivalent per week  . Drug use: No  . Sexual activity: Yes    Partners: Male    Comment: Vasectomy  Other Topics Concern  . Not on file  Social History Narrative  . Not on file   Social Determinants of Health   Financial Resource Strain:   . Difficulty of Paying Living Expenses:   Food Insecurity:   . Worried About Charity fundraiser in the Last Year:   . Arboriculturist in the Last Year:   Transportation Needs:   . Film/video editor (Medical):   Marland Kitchen Lack of Transportation (Non-Medical):   Physical Activity:   . Days of Exercise per Week:   . Minutes of Exercise per Session:   Stress:   . Feeling  of Stress :   Social Connections:   . Frequency of Communication with Friends and Family:   . Frequency of Social Gatherings with Friends and Family:   . Attends Religious Services:   . Active Member of Clubs or Organizations:   . Attends Archivist Meetings:   Marland Kitchen Marital Status:   Intimate Partner Violence:   . Fear of Current or Ex-Partner:   . Emotionally Abused:   Marland Kitchen Physically Abused:   . Sexually Abused:     Outpatient Medications Prior to Visit  Medication Sig Dispense Refill  . azelastine (ASTELIN) 0.1 % nasal spray Place into both nostrils 2 (two) times daily. Use in each nostril as directed    . montelukast (SINGULAIR) 10 MG tablet Take 10 mg by mouth at bedtime.    . nitroGLYCERIN (NITRODUR - DOSED IN MG/24 HR) 0.2 mg/hr patch Use 1/4 patch daily to the affected area. 30 patch 1  . Nutritional Supplements (JUICE PLUS FIBRE PO) Take by mouth daily.    . Psyllium (METAMUCIL FIBER PO)  Take by mouth daily.    Marland Kitchen Zoster Vaccine Adjuvanted [facility]) injection Inject 0.5 mLs IM now, repeat in 2 - 6 months. 0.5 mL 1  . diclofenac sodium (VOLTAREN) 1 % GEL Apply 2 g topically as needed. 100 g 3  . Fexofenadine-Pseudoephedrine (ALLEGRA-D 12 HOUR PO) Take by mouth daily.    . Lactobacillus (PROBIOTIC ACIDOPHILUS) CAPS Take by mouth daily.    . nitrofurantoin, macrocrystal-monohydrate, (MACROBID) 100 MG capsule Take 1 tab postcoitally for UTI prophylaxis 30 capsule 1   No facility-administered medications prior to visit.    No Known Allergies  ROS Review of Systems  Eyes: Negative for visual disturbance.  Respiratory: Negative for shortness of breath.   Cardiovascular: Negative for chest pain.  Skin: Negative for rash.  Neurological: Positive for headaches.      Objective:    Physical Exam Vitals reviewed.  Constitutional:      Appearance: She is well-developed.  Eyes:     General: No visual field deficit. Cardiovascular:     Rate and Rhythm: Normal rate and regular rhythm.  Pulmonary:     Effort: Pulmonary effort is normal.     Breath sounds: Normal breath sounds.  Skin:    Findings: No rash.  Neurological:     Mental Status: She is alert.     Cranial Nerves: No cranial nerve deficit or facial asymmetry.     Motor: No weakness.     Coordination: Coordination normal.     Gait: Gait normal.     BP 94/60 (BP Location: Left Arm, Patient Position: Sitting, Cuff Size: Large)   Pulse 62   Temp 97.8 F (36.6 C) (Oral)   Ht 5\' 4"  (1.626 m)   Wt 140 lb 4.8 oz (63.6 kg)   SpO2 97%   BMI 24.08 kg/m  Wt Readings from Last 3 Encounters:  08/07/19 140 lb 4.8 oz (63.6 kg)  07/13/19 134 lb (60.8 kg)  06/06/19 134 lb (60.8 kg)     Health Maintenance Due  Topic Date Due  . Hepatitis C Screening  Never done  . HIV Screening  Never done  . PAP SMEAR-Modifier  02/26/2017    There are no preventive care reminders to display for this patient.  Lab Results    Component Value Date   TSH 3.030 03/01/2017   Lab Results  Component Value Date   WBC 6.6 02/27/2016   HGB 15.3 02/27/2016   HCT  46.0 (H) 02/27/2016   MCV 96.2 02/27/2016   PLT 239 02/27/2016   Lab Results  Component Value Date   NA 142 03/01/2017   K 4.7 03/01/2017   CO2 21 03/01/2017   GLUCOSE 72 03/01/2017   BUN 12 03/01/2017   CREATININE 0.96 03/01/2017   BILITOT 0.3 03/01/2017   ALKPHOS 64 03/01/2017   AST 16 03/01/2017   ALT 13 03/01/2017   PROT 6.8 03/01/2017   ALBUMIN 4.2 03/01/2017   CALCIUM 9.2 03/01/2017   ANIONGAP 9 06/20/2014   Lab Results  Component Value Date   CHOL 207 (H) 02/27/2016   Lab Results  Component Value Date   HDL 79 02/27/2016   Lab Results  Component Value Date   LDLCALC 111 (H) 02/27/2016   Lab Results  Component Value Date   TRIG 84 02/27/2016   Lab Results  Component Value Date   CHOLHDL 2.6 02/27/2016   No results found for: HGBA1C    Assessment & Plan:   Atypical, unilateral very transient headache.  Etiology unclear.  Neuro exam nonfocal at this time.  We explained this would be somewhat atypical be related to nitroglycerin to be so transient.  She does not have any red flags such as nausea/vomiting, persistent daily headache, exertional headache, etc.  -Observe for now.  We reviewed signs and symptoms to watch for more worrisome headaches  No orders of the defined types were placed in this encounter.   Follow-up: No follow-ups on file.    Carolann Littler, MD

## 2019-08-07 NOTE — Patient Instructions (Signed)
General Headache Without Cause A headache is pain or discomfort felt around the head or neck area. The specific cause of a headache may not be found. There are many causes and types of headaches. A few common ones are:  Tension headaches.  Migraine headaches.  Cluster headaches.  Chronic daily headaches. Follow these instructions at home: Watch your condition for any changes. Let your health care provider know about them. Take these steps to help with your condition: Managing pain      Take over-the-counter and prescription medicines only as told by your health care provider.  Lie down in a dark, quiet room when you have a headache.  If directed, put ice on your head and neck area: ? Put ice in a plastic bag. ? Place a towel between your skin and the bag. ? Leave the ice on for 20 minutes, 2-3 times per day.  If directed, apply heat to the affected area. Use the heat source that your health care provider recommends, such as a moist heat pack or a heating pad. ? Place a towel between your skin and the heat source. ? Leave the heat on for 20-30 minutes. ? Remove the heat if your skin turns bright red. This is especially important if you are unable to feel pain, heat, or cold. You may have a greater risk of getting burned.  Keep lights dim if bright lights bother you or make your headaches worse. Eating and drinking  Eat meals on a regular schedule.  If you drink alcohol: ? Limit how much you use to:  0-1 drink a day for women.  0-2 drinks a day for men. ? Be aware of how much alcohol is in your drink. In the [place] one drink equals one 12 oz bottle of beer (355 mL), one 5 oz glass of wine (148 mL), or one 1 oz glass of hard liquor (44 mL).  Stop drinking caffeine, or decrease the amount of caffeine you drink. General instructions   Keep a headache journal to help find out what may trigger your headaches. For example, write down: ? What you eat and drink. ? How much  sleep you get. ? Any change to your diet or medicines.  Try massage or other relaxation techniques.  Limit stress.  Sit up straight, and do not tense your muscles.  Do not use any products that contain nicotine or tobacco, such as cigarettes, e-cigarettes, and chewing tobacco. If you need help quitting, ask your health care provider.  Exercise regularly as told by your health care provider.  Sleep on a regular schedule. Get 7-9 hours of sleep each night, or the amount recommended by your health care provider.  Keep all follow-up visits as told by your health care provider. This is important. Contact a health care provider if:  Your symptoms are not helped by medicine.  You have a headache that is different from the usual headache.  You have nausea or you vomit.  You have a fever. Get help right away if:  Your headache becomes severe quickly.  Your headache gets worse after moderate to intense physical activity.  You have repeated vomiting.  You have a stiff neck.  You have a loss of vision.  You have problems with speech.  You have pain in the eye or ear.  You have muscular weakness or loss of muscle control.  You lose your balance or have trouble walking.  You feel faint or pass out.  You have confusion.    You have a seizure. Summary  A headache is pain or discomfort felt around the head or neck area.  There are many causes and types of headaches. In some cases, the cause may not be found.  Keep a headache journal to help find out what may trigger your headaches. Watch your condition for any changes. Let your health care provider know about them.  Contact a health care provider if you have a headache that is different from the usual headache, or if your symptoms are not helped by medicine.  Get help right away if your headache becomes severe, you vomit, you have a loss of vision, you lose your balance, or you have a seizure. This information is not  intended to replace advice given to you by your health care provider. Make sure you discuss any questions you have with your health care provider. Document Revised: 07/26/2017 Document Reviewed: 07/26/2017 Elsevier Patient Education  2020 [employer]  

## 2019-08-10 ENCOUNTER — Ambulatory Visit: Payer: 59 | Admitting: Sports Medicine

## 2019-08-10 ENCOUNTER — Other Ambulatory Visit: Payer: Self-pay

## 2019-08-10 VITALS — BP 106/70 | Ht 64.0 in | Wt 136.0 lb

## 2019-08-10 DIAGNOSIS — M25511 Pain in right shoulder: Secondary | ICD-10-CM

## 2019-08-10 NOTE — Progress Notes (Deleted)
  Kimberly Stevens - 54 y.o. female MRN [id number]  Date of birth: 10/05/1965    SUBJECTIVE:      Chief Complaint:/ HPI:  Patient presenting for 4-week follow-up for chronic right shoulder pain.  Office visit 05/16/2019: Initially presented to sports medicine clinic, has been doing overhead weightlifting exercises.  Given home exercises for scapular stabilization and rotator cuff strengthening, advised to start overhead weightlifting, x-rays were obtained to rule out bony abnormality, scheduled for follow-up in 3 weeks.     Office visit 06/06/2019: Followed up for chronic right shoulder pain, admitted to not performing home exercises.  Was given nitroglycerin patch to be worn daily, ibuprofen as needed, and shoulder exercises.     ROS:     See HPI  PERTINENT  PMH / PSH FH / / SH:  Past Medical, Surgical, Social, and Family History Reviewed & Updated in the EMR.  Pertinent findings include:  ***  OBJECTIVE: BP 106/70   Ht 5\' 4"  (1.626 m)   Wt 136 lb (61.7 kg)   BMI 23.34 kg/m   Physical Exam:  Vital signs are reviewed.  GEN: Alert and oriented, NAD Pulm: Breathing unlabored PSY: normal mood, congruent affect  MSK: ***  ASSESSMENT & PLAN:  1. ***

## 2019-08-10 NOTE — Progress Notes (Signed)
   Subjective:    Patient ID: Waynard Edwards, female    DOB: 1965/04/28, 54 y.o.   MRN: [id number]  HPI Krisitin comes in today for follow-up on right shoulder pain.  She does feel like she is improving with physical therapy but she is still complaining of catching and popping in the anterior shoulder with swimming.  She is using her nitroglycerin patches.  She did experience a headache initially but that has resolved.  Although she is improving she is somewhat frustrated by the inability to return to the pool.  Previous x-rays did not show anything acute and no significant degenerative changes.    Review of Systems   As above Objective:   Physical Exam  Well-developed, well-nourished.  No acute distress.  Right shoulder: Full range of motion.  Rotator cuff strength remains 5/5 and does not reproduce pain.  She does have a positive O'Brien's.  Neurovascular intact distally.      Assessment & Plan:   Persistent right shoulder pain worrisome for SLAP tear  Patient has had several months of pain despite conservative treatment including physical therapy.  Although previous ultrasound showed a partial-thickness supraspinatus tendon tear, clinically I am suspicious that she may also have a SLAP tear.  Her mechanical symptoms in the anterior shoulder and her positive O'Brien's suggest this.  Therefore, I recommend that we proceed with an MRI arthrogram to evaluate further.  Phone follow-up with those results when available at which point we will delineate further treatment.  In the meantime, continue with physical therapy and topical nitroglycerin patches for her rotator cuff tear.

## 2019-08-16 ENCOUNTER — Other Ambulatory Visit: Payer: Self-pay | Admitting: Obstetrics & Gynecology

## 2019-08-16 DIAGNOSIS — Z1231 Encounter for screening mammogram for malignant neoplasm of breast: Secondary | ICD-10-CM

## 2019-08-18 ENCOUNTER — Ambulatory Visit
Admission: RE | Admit: 2019-08-18 | Discharge: 2019-08-18 | Disposition: A | Discharge status: Home / Self Care | Payer: 59 | Source: Ambulatory Visit | Attending: Sports Medicine | Admitting: Sports Medicine

## 2019-08-18 ENCOUNTER — Other Ambulatory Visit: Payer: Self-pay

## 2019-08-18 DIAGNOSIS — M25511 Pain in right shoulder: Secondary | ICD-10-CM

## 2019-08-18 MED ORDER — IOPAMIDOL (ISOVUE-M 200) INJECTION 41%: 15.0000 mL | Freq: Once | INTRAMUSCULAR | Status: DC

## 2019-08-21 ENCOUNTER — Telehealth: Payer: Self-pay | Admitting: Sports Medicine

## 2019-08-21 NOTE — Telephone Encounter (Signed)
  I spoke with Kimberly Stevens on the phone today after reviewing MRI findings of her right shoulder.  The MRI arthrogram shows a partial-thickness supraspinatus tendon tear involving approximately 50% of the tendon depth.  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  Given this constellation of symptoms, I recommended surgical consultation with an orthopedist.  She personally knows Dr. Veverly Fells and she would like to discuss these findings with him.  She will let me know if she needs my help.  Otherwise, follow-up as needed.

## 2019-08-24 ENCOUNTER — Ambulatory Visit
Admission: RE | Admit: 2019-08-24 | Discharge: 2019-08-24 | Disposition: A | Discharge status: Home / Self Care | Payer: 59 | Source: Ambulatory Visit | Attending: Obstetrics & Gynecology | Admitting: Obstetrics & Gynecology

## 2019-08-24 ENCOUNTER — Other Ambulatory Visit: Payer: Self-pay

## 2019-08-24 DIAGNOSIS — Z1231 Encounter for screening mammogram for malignant neoplasm of breast: Secondary | ICD-10-CM

## 2019-10-05 NOTE — Progress Notes (Signed)
54 y.o. G61P2002 Married White or Caucasian female here for annual exam.  Doing well.  Had radial fracture after ice skating fall.  Did some PT and she does have a 50% rotator cuff tear.  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  Sleep is good.  She is having hot flashes.  Does not want to be on HRT.     Denies vaginal bleeding.    No LMP recorded (exact date). Patient is postmenopausal.          Sexually active: Yes.    The current method of family planning is vasectomy.    Exercising: Yes.    walking, [place] Smoker:  no  Health Maintenance: Pap:  02-27-2016 neg HPV HR neg History of abnormal Pap:  no MMG:  08-25-2019 category b density birads 1:neg Colonoscopy:  02-24-12 polyps f/u 41yrs BMD:   none TDaP:  2014 Pneumonia vaccine(s):  Not done Shingrix:   Had done Hep C testing: not done Screening Labs: discussed today   reports that she has never smoked. She has never used smokeless tobacco. She reports current alcohol use of about 6.0 standard drinks of alcohol per week. She reports that she does not use drugs.  Past Medical History:  Diagnosis Date  . Arthritis    left great toe joint  . Broken arm    right elbow & wrist  . Colon polyps    benign  . Depression   . Dermoid cyst of ovary   . Eye disorder    like glaucoma-being followed by Dr Tyrone Schimke  . Fibrocystic breast   . IBS (irritable bowel syndrome)   . Plantar fasciitis    left foot 2/15-11/15  . Polyp of duodenum 2014   tiny  . Tendonitis    right knee 2/15-11/15  . Torn rotator cuff    right    Past Surgical History:  Procedure Laterality Date  . WISDOM TOOTH EXTRACTION      Current Outpatient Medications  Medication Sig Dispense Refill  . fluticasone (FLONASE) 50 MCG/ACT nasal spray     . montelukast (SINGULAIR) 10 MG tablet Take 10 mg by mouth at bedtime.    . Psyllium (METAMUCIL FIBER PO) Take by mouth daily.    . Nutritional Supplements (JUICE PLUS FIBRE PO) Take by mouth  daily. (Patient not taking: Reported on 10/09/2019)     No current facility-administered medications for this visit.    Family History  Problem Relation Age of Onset  . Leukemia Father        Myloma  . Diverticulitis Father   . Arthritis Mother   . Leukemia Maternal Uncle   . Arthritis Maternal Grandmother   . Arthritis Paternal Grandmother   . Colon cancer Neg Hx   . Colon polyps Neg Hx     Review of Systems  Constitutional: Negative.   HENT: Negative.   Eyes: Negative.   Respiratory: Negative.   Cardiovascular: Negative.   Gastrointestinal: Negative.   Endocrine: Negative.   Genitourinary: Negative.   Musculoskeletal: Negative.   Skin: Negative.   Allergic/Immunologic: Negative.   Neurological: Negative.   Hematological: Negative.   Psychiatric/Behavioral: Negative.     Exam:   BP 108/64   Pulse 70   Resp 16   Ht 5' 3.25" (1.607 m)   Wt 136 lb (61.7 kg)   LMP  (Exact Date)   BMI 23.90 kg/m   Height: 5' 3.25" (160.7 cm)  General appearance: alert, cooperative and  appears stated age Head: Normocephalic, without obvious abnormality, atraumatic Neck: no adenopathy, supple, symmetrical, trachea midline and thyroid normal to inspection and palpation Lungs: clear to auscultation bilaterally Breasts: normal appearance, no masses or tenderness Heart: regular rate and rhythm Abdomen: soft, non-tender; bowel sounds normal; no masses,  no organomegaly Extremities: extremities normal, atraumatic, no cyanosis or edema Skin: Skin color, texture, turgor normal. No rashes or lesions Lymph nodes: Cervical, supraclavicular, and axillary nodes normal. No abnormal inguinal nodes palpated Neurologic: Grossly normal   Pelvic: External genitalia:  no lesions              Urethra:  normal appearing urethra with no masses, tenderness or lesions              Bartholins and Skenes: normal                 Vagina: normal appearing vagina with normal color and discharge, no lesions               Cervix: no lesions              Pap taken: Yes.   Bimanual Exam:  Uterus:  normal size, contour, position, consistency, mobility, non-tender              Adnexa: normal adnexa and no mass, fullness, tenderness               Rectovaginal: Confirms               Anus:  normal sphincter tone, no lesions  Chaperone, Olene Floss, CMA, was present for exam.  A:  Well Woman with normal exam PMP, no HRT H/o recurrent UTIs, none this past year  P:   Mammogram guidelines reviewed.  Doing yearly. pap smear with HR HPV obtained today Lipids, CMP, CBC, TSH obtained today Colonoscopy 2014 Consider BMD  Vaccines UTD. Rf for macrobid 100mg  post coitally to pharmacy.  #30/1RF Return annually or prn

## 2019-10-09 ENCOUNTER — Encounter: Payer: Self-pay | Admitting: Obstetrics & Gynecology

## 2019-10-09 ENCOUNTER — Other Ambulatory Visit (HOSPITAL_COMMUNITY)
Admission: RE | Admit: 2019-10-09 | Discharge: 2019-10-09 | Disposition: A | Discharge status: Home / Self Care | Payer: 59 | Source: Ambulatory Visit | Attending: Obstetrics & Gynecology | Admitting: Obstetrics & Gynecology

## 2019-10-09 ENCOUNTER — Ambulatory Visit: Payer: 59 | Admitting: Obstetrics & Gynecology

## 2019-10-09 ENCOUNTER — Other Ambulatory Visit: Payer: Self-pay

## 2019-10-09 VITALS — BP 108/64 | HR 70 | Resp 16 | Ht 63.25 in | Wt 136.0 lb

## 2019-10-09 DIAGNOSIS — Z01419 Encounter for gynecological examination (general) (routine) without abnormal findings: Secondary | ICD-10-CM

## 2019-10-09 DIAGNOSIS — Z0000 Encounter for general adult medical examination without abnormal findings: Secondary | ICD-10-CM | POA: Diagnosis not present

## 2019-10-09 DIAGNOSIS — Z124 Encounter for screening for malignant neoplasm of cervix: Secondary | ICD-10-CM

## 2019-10-09 MED ORDER — NITROFURANTOIN MACROCRYSTAL 100 MG PO CAPS: ORAL_CAPSULE | ORAL | 1 refills | Status: DC

## 2019-10-10 LAB — COMPREHENSIVE METABOLIC PANEL
ALT: 16 IU/L (ref 0–32)
AST: 18 IU/L (ref 0–40)
Albumin/Globulin Ratio: 2.3 — ABNORMAL HIGH (ref 1.2–2.2)
Albumin: 4.9 g/dL (ref 3.8–4.9)
Alkaline Phosphatase: 77 IU/L (ref 44–121)
BUN/Creatinine Ratio: 16 (ref 9–23)
BUN: 13 mg/dL (ref 6–24)
Bilirubin Total: 0.3 mg/dL (ref 0.0–1.2)
CO2: 25 mmol/L (ref 20–29)
Calcium: 9.6 mg/dL (ref 8.7–10.2)
Chloride: 101 mmol/L (ref 96–106)
Creatinine, Ser: 0.81 mg/dL (ref 0.57–1.00)
GFR calc Af Amer: 95 mL/min/{1.73_m2} (ref >59)
GFR calc non Af Amer: 82 mL/min/{1.73_m2} (ref >59)
Globulin, Total: 2.1 g/dL (ref 1.5–4.5)
Glucose: 82 mg/dL (ref 65–99)
Potassium: 4.2 mmol/L (ref 3.5–5.2)
Sodium: 139 mmol/L (ref 134–144)
Total Protein: 7 g/dL (ref 6.0–8.5)

## 2019-10-10 LAB — CBC WITH DIFFERENTIAL/PLATELET
Basophils Absolute: 0.1 10*3/uL (ref 0.0–0.2)
Basos: 1 %
EOS (ABSOLUTE): 0.2 10*3/uL (ref 0.0–0.4)
Eos: 3 %
Hematocrit: 45.4 % (ref 34.0–46.6)
Hemoglobin: 15.3 g/dL (ref 11.1–15.9)
Immature Grans (Abs): 0 10*3/uL (ref 0.0–0.1)
Immature Granulocytes: 0 %
Lymphocytes Absolute: 2.5 10*3/uL (ref 0.7–3.1)
Lymphs: 38 %
MCH: 32.3 pg (ref 26.6–33.0)
MCHC: 33.7 g/dL (ref 31.5–35.7)
MCV: 96 fL (ref 79–97)
Monocytes Absolute: 0.5 10*3/uL (ref 0.1–0.9)
Monocytes: 7 %
Neutrophils Absolute: 3.4 10*3/uL (ref 1.4–7.0)
Neutrophils: 51 %
Platelets: 269 10*3/uL (ref 150–450)
RBC: 4.73 x10E6/uL (ref 3.77–5.28)
RDW: 11.6 % — ABNORMAL LOW (ref 11.7–15.4)
WBC: 6.7 10*3/uL (ref 3.4–10.8)

## 2019-10-10 LAB — LIPID PANEL
Chol/HDL Ratio: 3 ratio (ref 0.0–4.4)
Cholesterol, Total: 199 mg/dL (ref 100–199)
HDL: 67 mg/dL (ref >39)
LDL Chol Calc (NIH): 97 mg/dL (ref 0–99)
Triglycerides: 207 mg/dL — ABNORMAL HIGH (ref 0–149)
VLDL Cholesterol Cal: 35 mg/dL (ref 5–40)

## 2019-10-10 LAB — TSH: TSH: 2.35 u[IU]/mL (ref 0.450–4.500)

## 2019-10-11 ENCOUNTER — Encounter: Payer: Self-pay | Admitting: Obstetrics & Gynecology

## 2019-10-11 LAB — CYTOLOGY - PAP
Comment: NEGATIVE
Diagnosis: NEGATIVE
High risk HPV: NEGATIVE

## 2020-02-12 ENCOUNTER — Encounter: Payer: Self-pay | Admitting: Ophthalmology

## 2020-02-20 ENCOUNTER — Other Ambulatory Visit: Payer: Self-pay

## 2020-02-20 ENCOUNTER — Other Ambulatory Visit
Admission: RE | Admit: 2020-02-20 | Discharge: 2020-02-20 | Disposition: A | Discharge status: Home / Self Care | Payer: 59 | Source: Ambulatory Visit | Attending: Ophthalmology | Admitting: Ophthalmology

## 2020-02-20 DIAGNOSIS — Z20822 Contact with and (suspected) exposure to covid-19: Secondary | ICD-10-CM | POA: Insufficient documentation

## 2020-02-20 DIAGNOSIS — Z01812 Encounter for preprocedural laboratory examination: Secondary | ICD-10-CM | POA: Insufficient documentation

## 2020-02-20 LAB — SARS CORONAVIRUS 2 (TAT 6-24 HRS): SARS Coronavirus 2: NEGATIVE

## 2020-02-20 NOTE — Discharge Instructions (Signed)
INSTRUCTIONS FOLLOWING OCULOPLASTIC SURGERY AMY M. FOWLER, MD  AFTER YOUR EYE SURGERY, THER ARE MANY THINGS WHICH YOU, THE PATIENT, CAN DO TO ASSURE THE BEST POSSIBLE RESULT FROM YOUR OPERATION.  THIS SHEET SHOULD BE REFERRED TO WHENEVER QUESTIONS ARISE.  IF THERE ARE ANY QUESTIONS NOT ANSWERED HERE, DO NOT HESITATE TO CALL OUR OFFICE AT [phone] OR [phone].  THERE IS ALWAYS SOMEONE AVAILABLE TO CALL IF QUESTIONS OR PROBLEMS ARISE.  VISION: Your vision may be blurred and out of focus after surgery until you are able to stop using your ointment, swelling resolves and your eye(s) heal. This may take 1 to 2 weeks at the least.  If your vision becomes gradually more dim or dark, this is not normal and you need to call our office immediately.  EYE CARE: For the first 48 hours after surgery, use ice packs frequently - "20 minutes on, 20 minutes off" - to help reduce swelling and bruising.  Small bags of frozen peas or corn make good ice packs along with cloths soaked in ice water.  If you are wearing a patch or other type of dressing following surgery, keep this on for the amount of time specified by your doctor.  For the first week following surgery, you will need to treat your stitches with great care.  It is OK to shower, but take care to not allow soapy water to run into your eye(s) to help reduce chances of infection.  You may gently clean the eyelashes and around the eye(s) with cotton balls and sterile water, BUT DO NOT RUB THE STITCHES VIGOROUSLY.  Keeping your stitches moist with ointment will help promote healing with minimal scar formation.  ACTIVITY: When you leave the surgery center, you should go home, rest and be inactive.  The eye(s) may feel scratchy and keeping the eyes closed will allow for faster healing.  The first week following surgery, avoid straining (anything making the face turn red) or lifting over 20 pounds.  Additionally, avoid bending which causes your head to go below  your waist.  Using your eyes will NOT harm them, so feel free to read, watch television, use the computer, etc as desired.  Driving depends on each individual, so check with your doctor if you have questions about driving. Do not wear contact lenses for about 2 weeks.  Do not wear eye makeup for 2 weeks.  Avoid swimming, hot tubs, gardening, and dusting for 1 to 2 weeks to reduce the risk of an infection.  MEDICATIONS:  You will be given a prescription for an ointment to use 4 times a day on your stitches.  You can use the ointment in your eyes if they feel scratchy or irritated.  If you eyelid(s) don't close completely when you sleep, put some ointment in your eyes before bedtime.  EMERGENCY: If you experience SEVERE EYE PAIN OR HEADACHE UNRELIEVED BY TYLENOL OR TRAMADOL, NAUSEA OR VOMITING, WORSENING REDNESS, OR WORSENING VISION (ESPECIALLY VISION THAT WAS INITIALLY BETTER) CALL [phone] OR [phone] DURING BUSINESS HOURS OR AFTER HOURS. General Anesthesia, Adult, Care After This sheet gives you information about how to care for yourself after your procedure. Your health care provider may also give you more specific instructions. If you have problems or questions, contact your health care provider. What can I expect after the procedure? After the procedure, the following side effects are common:  Pain or discomfort at the IV site.  Nausea.  Vomiting.  Sore throat.  Trouble concentrating.  Feeling   cold or chills.  Feeling weak or tired.  Sleepiness and fatigue.  Soreness and body aches. These side effects can affect parts of the body that were not involved in surgery. Follow these instructions at home: For the time period you were told by your health care provider:  Rest.  Do not participate in activities where you could fall or become injured.  Do not drive or use machinery.  Do not drink alcohol.  Do not take sleeping pills or medicines that cause drowsiness.  Do  not make important decisions or sign legal documents.  Do not take care of children on your own.   Eating and drinking  Follow any instructions from your health care provider about eating or drinking restrictions.  When you feel hungry, start by eating small amounts of foods that are soft and easy to digest (bland), such as toast. Gradually return to your regular diet.  Drink enough fluid to keep your urine pale yellow.  If you vomit, rehydrate by drinking water, juice, or clear broth. General instructions  If you have sleep apnea, surgery and certain medicines can increase your risk for breathing problems. Follow instructions from your health care provider about wearing your sleep device: ? Anytime you are sleeping, including during daytime naps. ? While taking prescription pain medicines, sleeping medicines, or medicines that make you drowsy.  Have a responsible adult stay with you for the time you are told. It is important to have someone help care for you until you are awake and alert.  Return to your normal activities as told by your health care provider. Ask your health care provider what activities are safe for you.  Take over-the-counter and prescription medicines only as told by your health care provider.  If you smoke, do not smoke without supervision.  Keep all follow-up visits as told by your health care provider. This is important. Contact a health care provider if:  You have nausea or vomiting that does not get better with medicine.  You cannot eat or drink without vomiting.  You have pain that does not get better with medicine.  You are unable to pass urine.  You develop a skin rash.  You have a fever.  You have redness around your IV site that gets worse. Get help right away if:  You have difficulty breathing.  You have chest pain.  You have blood in your urine or stool, or you vomit blood. Summary  After the procedure, it is common to have a sore  throat or nausea. It is also common to feel tired.  Have a responsible adult stay with you for the time you are told. It is important to have someone help care for you until you are awake and alert.  When you feel hungry, start by eating small amounts of foods that are soft and easy to digest (bland), such as toast. Gradually return to your regular diet.  Drink enough fluid to keep your urine pale yellow.  Return to your normal activities as told by your health care provider. Ask your health care provider what activities are safe for you. This information is not intended to replace advice given to you by your health care provider. Make sure you discuss any questions you have with your health care provider. Document Revised: 09/21/2019 Document Reviewed: 04/20/2019 Elsevier Patient Education  2021 [employer]  

## 2020-02-22 ENCOUNTER — Encounter: Payer: Self-pay | Admitting: Ophthalmology

## 2020-02-22 ENCOUNTER — Encounter
Admission: RE | Disposition: A | Discharge status: Home / Self Care | Payer: Self-pay | Source: Home / Self Care | Attending: Ophthalmology

## 2020-02-22 ENCOUNTER — Ambulatory Visit: Payer: 59 | Admitting: Anesthesiology

## 2020-02-22 ENCOUNTER — Ambulatory Visit
Admission: RE | Admit: 2020-02-22 | Discharge: 2020-02-22 | Disposition: A | Discharge status: Home / Self Care | Payer: 59 | Attending: Ophthalmology | Admitting: Ophthalmology

## 2020-02-22 ENCOUNTER — Other Ambulatory Visit: Payer: Self-pay

## 2020-02-22 DIAGNOSIS — Z87891 Personal history of nicotine dependence: Secondary | ICD-10-CM | POA: Diagnosis not present

## 2020-02-22 DIAGNOSIS — Z79899 Other long term (current) drug therapy: Secondary | ICD-10-CM | POA: Diagnosis not present

## 2020-02-22 DIAGNOSIS — H02831 Dermatochalasis of right upper eyelid: Secondary | ICD-10-CM | POA: Diagnosis not present

## 2020-02-22 DIAGNOSIS — H02834 Dermatochalasis of left upper eyelid: Secondary | ICD-10-CM | POA: Diagnosis present

## 2020-02-22 HISTORY — PX: BROW LIFT: SHX178

## 2020-02-22 SURGERY — BLEPHAROPLASTY
Anesthesia: General | Site: Eye | Laterality: Bilateral

## 2020-02-22 MED ORDER — TRAMADOL HCL 50 MG PO TABS: ORAL_TABLET | ORAL | 0 refills | Status: DC

## 2020-02-22 MED ORDER — ERYTHROMYCIN 5 MG/GM OP OINT: TOPICAL_OINTMENT | OPHTHALMIC | 2 refills | Status: DC

## 2020-02-22 MED ORDER — PROPOFOL 500 MG/50ML IV EMUL
INTRAVENOUS | Status: DC | PRN
  Administered 2020-02-22: 50 ug/kg/min via INTRAVENOUS

## 2020-02-22 MED ORDER — LIDOCAINE-EPINEPHRINE 2 %-1:100000 IJ SOLN
INTRAMUSCULAR | Status: DC | PRN
  Administered 2020-02-22: 2.5 mL via OPHTHALMIC

## 2020-02-22 MED ORDER — FENTANYL CITRATE (PF) 100 MCG/2ML IJ SOLN: 25.0000 ug | INTRAMUSCULAR | Status: DC | PRN

## 2020-02-22 MED ORDER — LACTATED RINGERS IV SOLN: INTRAVENOUS | Status: DC

## 2020-02-22 MED ORDER — ACETAMINOPHEN 160 MG/5ML PO SOLN: 325.0000 mg | ORAL | Status: DC | PRN

## 2020-02-22 MED ORDER — OXYCODONE HCL 5 MG/5ML PO SOLN: 5.0000 mg | Freq: Once | ORAL | Status: DC | PRN

## 2020-02-22 MED ORDER — ACETAMINOPHEN 325 MG PO TABS: 325.0000 mg | ORAL_TABLET | ORAL | Status: DC | PRN

## 2020-02-22 MED ORDER — OXYCODONE HCL 5 MG PO TABS: 5.0000 mg | ORAL_TABLET | Freq: Once | ORAL | Status: DC | PRN

## 2020-02-22 MED ORDER — ALFENTANIL 500 MCG/ML IJ INJ
INJECTION | INTRAVENOUS | Status: DC | PRN
  Administered 2020-02-22: 200 ug via INTRAVENOUS
  Administered 2020-02-22: 800 ug via INTRAVENOUS

## 2020-02-22 MED ORDER — BSS IO SOLN
INTRAOCULAR | Status: DC | PRN
  Administered 2020-02-22: 15 mL via INTRAOCULAR

## 2020-02-22 MED ORDER — MIDAZOLAM HCL 2 MG/2ML IJ SOLN
INTRAMUSCULAR | Status: DC | PRN
  Administered 2020-02-22: 2 mg via INTRAVENOUS

## 2020-02-22 MED ORDER — LIDOCAINE HCL (CARDIAC) PF 100 MG/5ML IV SOSY
PREFILLED_SYRINGE | INTRAVENOUS | Status: DC | PRN
  Administered 2020-02-22: 50 mg via INTRAVENOUS

## 2020-02-22 MED ORDER — TETRACAINE HCL 0.5 % OP SOLN
OPHTHALMIC | Status: DC | PRN
  Administered 2020-02-22: 2 [drp] via OPHTHALMIC

## 2020-02-22 MED ORDER — ERYTHROMYCIN 5 MG/GM OP OINT
TOPICAL_OINTMENT | OPHTHALMIC | Status: DC | PRN
  Administered 2020-02-22: 1 via OPHTHALMIC

## 2020-02-22 SURGICAL SUPPLY — 24 items
APPLICATOR COTTON TIP WD 3 STR (MISCELLANEOUS) ×2 IMPLANT
BLADE SURG 15 STRL LF DISP TIS (BLADE) ×1 IMPLANT
BLADE SURG 15 STRL SS (BLADE) ×2
CORD BIP STRL DISP 12FT (MISCELLANEOUS) ×2 IMPLANT
DRAPE HEAD BAR (DRAPES) ×2 IMPLANT
GAUZE SPONGE 4X4 12PLY STRL (GAUZE/BANDAGES/DRESSINGS) ×2 IMPLANT
GLOVE SURG LX 7.0 MICRO (GLOVE) ×2
GLOVE SURG LX STRL 7.0 MICRO (GLOVE) ×2 IMPLANT
GOWN STRL REUS W/ TWL LRG LVL3 (GOWN DISPOSABLE) ×1 IMPLANT
GOWN STRL REUS W/TWL LRG LVL3 (GOWN DISPOSABLE) ×2
MARKER SKIN XFINE TIP W/RULER (MISCELLANEOUS) ×2 IMPLANT
NDL FILTER BLUNT 18X1 1/2 (NEEDLE) ×1 IMPLANT
NDL HYPO 30X.5 LL (NEEDLE) ×2 IMPLANT
NEEDLE FILTER BLUNT 18X 1/2SAF (NEEDLE) ×1
NEEDLE FILTER BLUNT 18X1 1/2 (NEEDLE) ×1 IMPLANT
NEEDLE HYPO 30X.5 LL (NEEDLE) ×4 IMPLANT
PACK ENT CUSTOM (PACKS) ×2 IMPLANT
SOL PREP PVP 2OZ (MISCELLANEOUS) ×2
SOLUTION PREP PVP 2OZ (MISCELLANEOUS) ×1 IMPLANT
SPONGE GAUZE 2X2 8PLY STRL LF (GAUZE/BANDAGES/DRESSINGS) ×20 IMPLANT
SUT GUT PLAIN 6-0 1X18 ABS (SUTURE) ×2 IMPLANT
SYR 10ML LL (SYRINGE) ×2 IMPLANT
SYR 3ML LL SCALE MARK (SYRINGE) ×2 IMPLANT
WATER STERILE IRR 250ML POUR (IV SOLUTION) ×2 IMPLANT

## 2020-02-22 NOTE — Anesthesia Procedure Notes (Signed)
Performed by: Decarlo Rivet, CRNA Pre-anesthesia Checklist: Patient identified, Emergency Drugs available, Suction available, Timeout performed and Patient being monitored Patient Re-evaluated:Patient Re-evaluated prior to induction Oxygen Delivery Method: Nasal cannula Placement Confirmation: positive ETCO2       

## 2020-02-22 NOTE — Anesthesia Preprocedure Evaluation (Signed)
Anesthesia Evaluation  Patient identified by MRN, date of birth, ID band Patient awake    Reviewed: Allergy & Precautions, NPO status , Patient's Chart, lab work & pertinent test results  Airway Mallampati: II  TM Distance: >3 FB     Dental   Pulmonary neg recent URI, former smoker,    breath sounds clear to auscultation       Cardiovascular negative cardio ROS   Rhythm:Regular Rate:Normal     Neuro/Psych  Headaches, Depression    GI/Hepatic neg GERD  ,IBS   Endo/Other  Hypothyroidism   Renal/GU      Musculoskeletal  (+) Arthritis ,   Abdominal   Peds  Hematology   Anesthesia Other Findings   Reproductive/Obstetrics                             Anesthesia Physical Anesthesia Plan  ASA: II  Anesthesia Plan: General   Post-op Pain Management:    Induction: Intravenous  PONV Risk Score and Plan: Propofol infusion, TIVA and Treatment may vary due to age or medical condition  Airway Management Planned: Natural Airway and Nasal Cannula  Additional Equipment:   Intra-op Plan:   Post-operative Plan:   Informed Consent: I have reviewed the patients History and Physical, chart, labs and discussed the procedure including the risks, benefits and alternatives for the proposed anesthesia with the patient or authorized representative who has indicated his/her understanding and acceptance.       Plan Discussed with: CRNA  Anesthesia Plan Comments:         Anesthesia Quick Evaluation

## 2020-02-22 NOTE — Interval H&P Note (Signed)
History and Physical Interval Note:  02/22/2020 1:44 PM  Kimberly Stevens  has presented today for surgery, with the diagnosis of H02.831 Dermatochalasis of Right Upper Eyelid H02.834 Dermatochalasis of Left Upper Eyelid.  The various methods of treatment have been discussed with the patient and family. After consideration of risks, benefits and other options for treatment, the patient has consented to  Procedure(s): BLEPHAROPLASTY UPPER EYELID; W/EXCESS SKIN BILATERAL (Bilateral) as a surgical intervention.  The patient's history has been reviewed, patient examined, no change in status, stable for surgery.  I have reviewed the patient's chart and labs.  Questions were answered to the patient's satisfaction.     Vickki Muff, Mahrukh Seguin M

## 2020-02-22 NOTE — Transfer of Care (Signed)
Immediate Anesthesia Transfer of Care Note  Patient: Kimberly Stevens  Procedure(s) Performed: BLEPHAROPLASTY UPPER EYELID; W/EXCESS SKIN BILATERAL (Bilateral Eye)  Patient Location: PACU  Anesthesia Type: General  Level of Consciousness: awake, alert  and patient cooperative  Airway and Oxygen Therapy: Patient Spontanous Breathing and Patient connected to supplemental oxygen  Post-op Assessment: Post-op Vital signs reviewed, Patient's Cardiovascular Status Stable, Respiratory Function Stable, Patent Airway and No signs of Nausea or vomiting  Post-op Vital Signs: Reviewed and stable  Complications: No complications documented.

## 2020-02-22 NOTE — H&P (Signed)
See the history and physical completed at [facility] on 02/12/2020 and scanned into the chart.

## 2020-02-22 NOTE — Op Note (Signed)
Preoperative Diagnosis:  Visually significant dermatochalasis bilateral  Upper Eyelid(s)  Postoperative Diagnosis:  Same.  Procedure(s) Performed:   Upper eyelid blepharoplasty with excess skin excision  both  Upper Eyelid(s)  Surgeon: Philis Pique. Vickki Muff, M.D.  Assistants: none  Anesthesia: MAC  Specimens: None.  Estimated Blood Loss: Minimal.  Complications: None.  Operative Findings: None Dictated  Procedure:   Allergies were reviewed and the patient is allergic to Patient has no known allergies..   After the risks, benefits, complications and alternatives were discussed with the patient, appropriate informed consent was obtained and the patient was brought to the operating suite. The patient was reclined supine and a timeout was conducted.  The patient was then sedated.  Local anesthetic consisting of a 50-50 mixture of 2% lidocaine with epinephrine and 0.75% bupivacaine with added Hylenex was injected subcutaneously to both  upper eyelid(s). After adequate local was instilled, the patient was prepped and draped in the usual sterile fashion for eyelid surgery.   Attention was turned to the upper eyelids. A 73m upper eyelid crease incision line was marked with calipers on both  upper eyelid(s).  A pinch test was used to estimate the amount of excess skin to remove and this was marked in standard blepharoplasty style fashion. Attention was turned to the  right  upper eyelid. A #15 blade was used to open the premarked incision line. A Skin and muscle flap was excised and hemostasis was obtained with bipolar cautery.   A buttonhole was created medially in orbicularis and orbital septum to reveal the medial fat pocket. This was dissected free from fascial attachments, cauterized towards the pedicle base and excised to produce a nice flattening of the medial corner of the upper eyelid.  A strip of ROOF fat was excised to debulk the lateral part of the upper eyelid.  Attention was then  turned to the opposite eyelid where the same procedure was performed in the same manner. Hemostasis was obtained with bipolar cautery throughout. All incisions were then closed with a combination of running and interrupted 6-0 fast absorbing plain suture. The patient tolerated the procedure well.  Erythromycin ophthalmic ointment was applied to her incision sites, followed by ice packs. She was taken to the recovery area where she recovered without difficulty.  Post-Op Plan/Instructions:  The patient was instructed to use ice packs frequently for the next 48 hours. She was instructed to use Erythromycin ophthalmic ointment on her incisions 4 times a day for the next 12 to 14 days. She was given a prescription for tramadol (or similar) for pain control should Tylenol not be effective. [facility] [place].  Rheana Casebolt M. FVickki Muff M.D. Ophthalmology

## 2020-02-22 NOTE — Anesthesia Postprocedure Evaluation (Signed)
Anesthesia Post Note  Patient: Kimberly Stevens  Procedure(s) Performed: BLEPHAROPLASTY UPPER EYELID; W/EXCESS SKIN BILATERAL (Bilateral Eye)     Patient location during evaluation: PACU Anesthesia Type: General Level of consciousness: awake Pain management: pain level controlled Vital Signs Assessment: post-procedure vital signs reviewed and stable Respiratory status: respiratory function stable Cardiovascular status: stable Postop Assessment: no signs of nausea or vomiting Anesthetic complications: no   No complications documented.  Veda Canning

## 2020-02-23 ENCOUNTER — Encounter: Payer: Self-pay | Admitting: Ophthalmology

## 2020-03-05 ENCOUNTER — Telehealth: Payer: Self-pay

## 2020-03-05 NOTE — Telephone Encounter (Signed)
Fine with me

## 2020-03-05 NOTE — Telephone Encounter (Signed)
OK with me.

## 2020-03-05 NOTE — Telephone Encounter (Signed)
Pt would like to transfer from Dr. Elease Hashimoto to Dr. Yong Channel. Pt.'s husband is a Dr. Yong Channel pt. Please advise

## 2020-03-06 ENCOUNTER — Ambulatory Visit: Payer: 59 | Admitting: Internal Medicine

## 2020-03-06 ENCOUNTER — Encounter: Payer: Self-pay | Admitting: Internal Medicine

## 2020-03-06 VITALS — BP 100/70 | HR 65 | Ht 63.25 in | Wt 133.0 lb

## 2020-03-06 DIAGNOSIS — R194 Change in bowel habit: Secondary | ICD-10-CM | POA: Diagnosis not present

## 2020-03-06 DIAGNOSIS — R197 Diarrhea, unspecified: Secondary | ICD-10-CM

## 2020-03-06 NOTE — Patient Instructions (Signed)
Please call us with any further problems

## 2020-03-06 NOTE — Progress Notes (Signed)
HISTORY OF PRESENT ILLNESS:  Kimberly Stevens is a 55 y.o. female with past medical history as listed below who presents today for evaluation of change in bowel habits with a tendency toward loose stools.  She was last seen in this office by the GI physician assistant April 28, 2017 regarding pruritus ani and change in bowel habits.  See that dictation.  She was placed on Metamucil.  She reports improvement in symptoms.  About 2 weeks ago she underwent blepharoplasty.  Not clear if she received perioperative antibiotics.  Approximately 8 days ago she consumed salad.  The following day she noted increased frequency of stools which were soft and loose.  Some borborygmi.  No bleeding or abdominal pain.  No vomiting.  This has persisted.  She stopped her fiber 2 days ago.  Somewhat worse yesterday.  Contacted the office yesterday and this appointment arranged.  Blood work from September 2021 was unremarkable including normal hemoglobin of 15.3.  She did undergo complete colonoscopy in February 2014.  This was normal including intubation of the terminal ileum (diminutive polyp and pathology removed).  Upper endoscopy at that same time and was normal.  REVIEW OF SYSTEMS:  All non-GI ROS negative unless otherwise stated in the HPI except for hot flashes at night, muscle cramps  Past Medical History:  Diagnosis Date  . Arthritis    left great toe joint  . Broken arm    right elbow & wrist  . Colon polyps    benign  . Depression   . Dermoid cyst of ovary   . Eye disorder    like glaucoma-being followed by Dr Tyrone Schimke  . Fibrocystic breast   . IBS (irritable bowel syndrome)   . Plantar fasciitis    left foot 2/15-11/15  . Polyp of duodenum 2014   tiny  . Tendonitis    right knee 2/15-11/15  . Torn rotator cuff    right    Past Surgical History:  Procedure Laterality Date  . BROW LIFT Bilateral 02/22/2020   Procedure: BLEPHAROPLASTY UPPER EYELID; W/EXCESS SKIN BILATERAL;  Surgeon: Karle Starch,  MD;  Location: [place];  Service: Ophthalmology;  Laterality: Bilateral;  . WISDOM TOOTH EXTRACTION      Social History Kimberly Stevens  reports that she has never smoked. She has never used smokeless tobacco. She reports current alcohol use of about 6.0 standard drinks of alcohol per week. She reports that she does not use drugs.  family history includes Arthritis in her maternal grandmother, mother, and paternal grandmother; Diverticulitis in her father; Leukemia in her father and maternal uncle.  No Known Allergies     PHYSICAL EXAMINATION:  Vital signs: BP 100/70   Pulse 65   Ht 5' 3.25" (1.607 m)   Wt 133 lb (60.3 kg)   LMP 06/20/2017 (Approximate)   SpO2 96%   BMI 23.37 kg/m   Constitutional: generally well-appearing, no acute distress Psychiatric: alert and oriented x3, cooperative Eyes: extraocular movements intact, anicteric, conjunctiva pink Mouth: oral pharynx moist, no lesions Neck: supple no lymphadenopathy Cardiovascular: heart regular rate and rhythm, no murmur Lungs: clear to auscultation bilaterally Abdomen: soft, nontender, nondistended, no obvious ascites, no peritoneal signs, normal bowel sounds, no organomegaly Rectal: Omitted Extremities: no clubbing, cyanosis, or lower extremity edema bilaterally Skin: no lesions on visible extremities Neuro: No focal deficits.  Cranial nerves intact   ASSESSMENT:  1.  Change in bowel habits as described.  Suspect self-limited gastroenteritis.  No alarm features.  Duration of change has been 1 week. 2.  Normal colonoscopy 2014 3.  Colonoscopy 2014   PLAN:  1.  Expectant management 2.  Resume fiber 3.  Diet as tolerated 4.  Contact the office for persistent or worsening symptoms. 5.  If so, consider stool studies and/or empiric course of therapy with metronidazole 250 mg 3 times daily for 7 days.  We discussed this

## 2020-03-06 NOTE — Telephone Encounter (Signed)
Pt is scheduled °

## 2020-03-11 ENCOUNTER — Telehealth (INDEPENDENT_AMBULATORY_CARE_PROVIDER_SITE_OTHER): Payer: 59 | Admitting: Family Medicine

## 2020-03-11 ENCOUNTER — Encounter: Payer: Self-pay | Admitting: Family Medicine

## 2020-03-11 VITALS — Ht 64.0 in | Wt 133.0 lb

## 2020-03-11 DIAGNOSIS — Z87898 Personal history of other specified conditions: Secondary | ICD-10-CM | POA: Diagnosis not present

## 2020-03-11 DIAGNOSIS — R197 Diarrhea, unspecified: Secondary | ICD-10-CM | POA: Diagnosis not present

## 2020-03-11 NOTE — Patient Instructions (Addendum)
Virtual visit 

## 2020-03-11 NOTE — Progress Notes (Signed)
Phone [phone] Virtual visit via Video note- patient transferring care to be at same practice as husband   Subjective:  Chief complaint: Chief Complaint  Patient presents with  . soft stool    12 days, started as diarrhea. Patient states that she's having a lot of gas with it.    This visit type was conducted due to national recommendations for restrictions regarding the COVID-19 Pandemic (e.g. social distancing).  This format is felt to be most appropriate for this patient at this time balancing risks to patient and risks to population by having him in for in person visit.  No physical exam was performed (except for noted visual exam or audio findings with Telehealth visits).    Our team/I connected with Roselie Awkward Gade at  4:40 PM EST by a video enabled telemedicine application (doxy.me or caregility through epic) and verified that I am speaking with the correct person using two identifiers.  Location patient: Home-[place] Location provider: [facility], office Persons participating in the virtual visit:  patient  Our team/I discussed the limitations of evaluation and management by telemedicine and the availability of in person appointments. In light of current covid-19 pandemic, patient also understands that we are trying to protect them by minimizing in office contact if at all possible.  The patient expressed consent for telemedicine visit and agreed to proceed. Patient understands insurance will be billed.   Past Medical History-  Patient Active Problem List   Diagnosis Date Noted  . Biceps tendinopathy of right upper extremity 05/16/2019  . Hamstring strain, subsequent encounter 08/25/2016  . Plantar fasciitis of right foot 05/28/2016  . Acquired hallux limitus of left foot 05/28/2016  . Upper back pain on right side 12/03/2015  . Lateral epicondylitis of right elbow 04/09/2015  . Lateral epicondylitis of left elbow 05/10/2014  . Greater trochanteric bursitis of right hip  12/21/2013  . Patellar tendinitis of right knee 07/20/2013  . Left Achilles tendinitis 11/08/2012  . Pain in joint, pelvic region and thigh 03/08/2012  . 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 OTHER SYMPTOMS INVOLVING DIGESTIVE SYSTEM OTHER 07/25/2008  . ABDOMINAL PAIN -GENERALIZED 07/25/2008  . EPIGASTRIC PAIN 07/19/2008  . KNEE PAIN, RIGHT 06/08/2008  . TALIPES CAVUS 12/28/2007  . FOOT PAIN, CHRONIC 12/20/2007  . BENIGN NEOPLASM OF UNSPECIFIED SITE 12/13/2006    Medications- reviewed and updated Current Outpatient Medications  Medication Sig Dispense Refill  . erythromycin ophthalmic ointment Apply to sutures 4 times a day for 10-12 days.  Discontinue if allergy develops and call our office 3.5 g 2  . montelukast (SINGULAIR) 10 MG tablet Take 10 mg by mouth at bedtime.    . Multiple Vitamin (MULTIVITAMIN ADULT PO) Take by mouth.    . Nutritional Supplements (JUICE PLUS FIBRE PO) Take by mouth daily.    . Probiotic Product (PROBIOTIC-10 PO) Take by mouth.    . Psyllium (METAMUCIL FIBER PO) Take by mouth daily.    . nitrofurantoin (MACRODANTIN) 100 MG capsule Takes 1 tablet after intercourse (Patient not taking: No sig reported) 30 capsule 1   No current facility-administered medications for this visit.     Objective:  Ht 5\' 4"  (1.626 m)   Wt 133 lb (60.3 kg)   LMP 06/20/2017 (Approximate)   BMI 22.83 kg/m  self reported vitals Gen: NAD, resting comfortably Lungs: nonlabored, normal respiratory rate  Skin: appears dry, no obvious rash    Assessment and Plan   # Diarrhea  S: Patient had  surgery 2 weeks ago- blepharoplasty - had visual field issues so required surgery. No antibiotics were used. No issues with anesthesia or anything.  Wednesday of the following week developed explosive diarrhea- had a salad from [employer] and wondered if this was the trigger. On day 12 today. Initially watery diarrhea but lately Stools have begun to firm up though still  soft (but not clearly conforming to shape of container) up at this point- no longer pure liquid. Staying hydrated. Tends toward constipation at baseline. She does note she was off her probiotic a week prior to surgery. No blood in stool or dark black stool. No mucus in the stool. Also having a lot of gaseous distension- and passing more gas with bowel movements- otherwise holds it in. Some chills- no fever.   Saw GI doctor and thought it was antibiotics related to the surgery- but apparently no antibiotics were used when patient later called back the surgeon- who recommended PCP follow up.  They had considered an antibiotic- metronidazole. Patient is staying on her fiber supplement per GI.   Usually cannot get far from bathroom- was able to walk an hour today.   Tested for covid and negative- it was a rapid test. Agrees to go get a PCR test.   Able to eat normal foods- pot roast last night- steak dinner. Still doing salad/fruit.  A/P: 55 year old female generally healthy female presenting with diarrhea now transitioning to very soft stools but not diarrhea in last few days. She was off probiotic prior to sugery and has restarted and seems to be improving. Could have been related to a meal she had. Less liekly related to anesthesia as multiple days out from that and no antibiotics with surgery.  - patient eating her normal diet and we discussed BRAT type diet -imodium may help but could also constipate her -overall no red flags and ok to continue to monitor -I will order stool studies if recurrent clear diarrhea - without clear diarrhea would likely be rejected  Recommended follow up:  Has CPE upcoming in may Future Appointments  Date Time Provider [place]  03/12/2020  5:15 PM [facility] None  06/10/2020  4:00 PM Marin Olp, MD [facility]    Lab/Order associations:   ICD-10-CM   1. History of diarrhea  Z87.898   2. Diarrhea, unspecified type  R19.7    Time  Spent: 24 minutes of total time (5:28 PM- 5:47 PM, 9:04 PM- 9:09 PM) was spent on the date of the encounter performing the following actions: chart review prior to seeing the patient, obtaining history, performing a medically necessary exam, counseling on the treatment plan, placing orders, and documenting in our EHR.   Return precautions advised.  Garret Reddish, MD

## 2020-03-12 ENCOUNTER — Other Ambulatory Visit: Payer: 59

## 2020-03-21 ENCOUNTER — Encounter: Payer: Self-pay | Admitting: Family Medicine

## 2020-03-21 DIAGNOSIS — R197 Diarrhea, unspecified: Secondary | ICD-10-CM

## 2020-03-21 NOTE — Telephone Encounter (Signed)
Pt is following up on this 

## 2020-03-22 ENCOUNTER — Other Ambulatory Visit: Payer: 59

## 2020-03-22 DIAGNOSIS — R197 Diarrhea, unspecified: Secondary | ICD-10-CM

## 2020-03-24 LAB — GI PROFILE, STOOL, PCR

## 2020-03-25 NOTE — Addendum Note (Signed)
Addended by: Jacob Moores on: 03/25/2020 10:47 AM   Modules accepted: Orders

## 2020-03-26 ENCOUNTER — Other Ambulatory Visit: Payer: Self-pay

## 2020-03-26 DIAGNOSIS — R197 Diarrhea, unspecified: Secondary | ICD-10-CM

## 2020-03-27 ENCOUNTER — Other Ambulatory Visit: Payer: 59

## 2020-03-27 DIAGNOSIS — R197 Diarrhea, unspecified: Secondary | ICD-10-CM

## 2020-03-28 ENCOUNTER — Other Ambulatory Visit (HOSPITAL_BASED_OUTPATIENT_CLINIC_OR_DEPARTMENT_OTHER): Payer: Self-pay | Admitting: Obstetrics & Gynecology

## 2020-03-28 ENCOUNTER — Telehealth (HOSPITAL_BASED_OUTPATIENT_CLINIC_OR_DEPARTMENT_OTHER): Payer: Self-pay | Admitting: *Deleted

## 2020-03-28 ENCOUNTER — Encounter: Payer: Self-pay | Admitting: Family Medicine

## 2020-03-28 LAB — GI PROFILE, STOOL, PCR

## 2020-03-28 MED ORDER — FLUCONAZOLE 150 MG PO TABS: 150.0000 mg | ORAL_TABLET | Freq: Once | ORAL | 0 refills | Status: AC

## 2020-03-28 NOTE — Telephone Encounter (Signed)
Pt called stating that she has a yeast infection that will not go away with OTC treatments. She states that she has a history of them. She would like something sent to her pharmacy, [facility].   Advised that I would send her request to the provider and call her back if provider is unable to prescribe. Pt verbalized understanding.

## 2020-03-28 NOTE — Telephone Encounter (Signed)
Rx for diflucan 150mg  po x 1, repeat in 72 hours sent to pharmacy.  Please have her call back if symptoms do not resolve.  Thanks.

## 2020-03-31 LAB — GI PROFILE, STOOL, PCR

## 2020-04-01 NOTE — Telephone Encounter (Signed)
Pt is requesting an office visit to address her concerns. She is wanting some blood work done. Pt tested negative for covid. Can I use sameday on next Monday for pt?

## 2020-04-03 ENCOUNTER — Encounter: Payer: Self-pay | Admitting: Nurse Practitioner

## 2020-04-03 ENCOUNTER — Ambulatory Visit: Payer: 59 | Admitting: Nurse Practitioner

## 2020-04-03 VITALS — BP 122/80 | HR 73 | Temp 98.3°F | Ht 64.0 in | Wt 135.6 lb

## 2020-04-03 DIAGNOSIS — A09 Infectious gastroenteritis and colitis, unspecified: Secondary | ICD-10-CM

## 2020-04-03 NOTE — Patient Instructions (Signed)
It was a pleasure to see you today. Based on our discussion, I am providing you with my recommendations below:  RECOMMENDATION(S):   . Continue BRAT diet for 2 weeks.  . Start Imodium 2 mg twice daily. Hold for constipation.   . Call or send Mychart message in 2 weeks with update.  BMI:  If you are age 56 or older, your body mass index should be between 23-30. Your Body mass index is 23.28 kg/m. If this is out of the aforementioned range listed, please consider follow up with your Primary Care Provider.  If you are age 74 or younger, your body mass index should be between 19-25. Your Body mass index is 23.28 kg/m. If this is out of the aformentioned range listed, please consider follow up with your Primary Care Provider.    Thank you for trusting me with your gastrointestinal care!    Tye Savoy, NP

## 2020-04-03 NOTE — Progress Notes (Signed)
ASSESSMENT AND PLAN    # 55 yo female with viral gastroenteritis. She developed frequent, loose stool and borborygmi on 02/27/20. GI pathogen panel 03/22/20 positive for Norovirus. She is non-toxic appearing, not significantly tender on exam. It is a little surprising that the loose stools have persisted for 6 weeks. 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  --Recommend Imodium 2 mg BID. Hold for constipation.  --Continue good hydration --Continue probiotics --She will contact us with a condition update through Mychart in a couple of weeks, or sooner for worsening symptoms.   HISTORY OF PRESENT ILLNESS     Primary Gastroenterologist : Scarlette Shorts, MD   Chief Complaint : diarrhea  Kimberly Stevens is a 55 y.o. female known to Dr. Henrene Pastor.  In early February patient underwent blepharoplasty , it was not clear if she received perioperative antibiotics.  About a week after surgery, after eating a salad, she developed increased frequency of stools which were soft and loose. She had some associated borborygmi.  Patient had been on Metamucil but she stopped this and came in to see Dr. Henrene Pastor for further evaluation on 03/06/2020.  It was felt that her change in bowel habits likely represented self-limited gastroenteritis.  She was advised to resume fiber and contact office for persistent or worsening symptoms. The following week she had a virtual visit with PCP, had ongoing diarrhea but plan was to watch and wait.  Diarrhea continued, GI pathogen panel submitted on 03/25/20. Specimen was lost in transit. She submitted another specimen and it returned positive for Norovirus. Ther original specimen was recovered and it too was positive for Norovirus.    Angelena tried Imodium and it stopped the loose stools but she was worried about getting constipated which she has tended towards in the past. She is passing loose stools up to 10 times a day. No nocturnal stooling. She has excessive gas and ongoing borborygmi.     Previous Endoscopic Evaluations / Pertinent Studies:    Feb 2014 colonoscopy --diminuitive polyp ( benign )    Past Medical History:  Diagnosis Date   Arthritis    left great toe joint   Broken arm    right elbow & wrist   Colon polyps    benign   Depression    Dermoid cyst of ovary    Eye disorder    like glaucoma-being followed by Dr Tyrone Schimke   Fibrocystic breast    IBS (irritable bowel syndrome)    Plantar fasciitis    left foot 2/15-11/15   Polyp of duodenum 2014   tiny   Tendonitis    right knee 2/15-11/15   Torn rotator cuff    right    Current Medications, Allergies, Past Surgical History, Family History and Social History were reviewed in [employer] record.   Current Outpatient Medications  Medication Sig Dispense Refill   erythromycin ophthalmic ointment Apply to sutures 4 times a day for 10-12 days.  Discontinue if allergy develops and call our office 3.5 g 2   fluticasone (FLONASE) 50 MCG/ACT nasal spray Place 1 spray into both nostrils daily.     montelukast (SINGULAIR) 10 MG tablet Take 10 mg by mouth at bedtime.     Multiple Vitamin (MULTIVITAMIN ADULT PO) Take 1 tablet by mouth daily.     nitrofurantoin (MACRODANTIN) 100 MG capsule Takes 1 tablet after intercourse 30 capsule 1   Probiotic Product (PROBIOTIC-10 PO) Take 1 tablet by mouth daily.  Psyllium (METAMUCIL FIBER PO) Take by mouth daily.     No current facility-administered medications for this visit.    Review of Systems: No chest pain. No shortness of breath. No urinary complaints.   PHYSICAL EXAM :    Wt Readings from Last 3 Encounters:  04/03/20 135 lb 9.6 oz (61.5 kg)  03/11/20 133 lb (60.3 kg)  03/06/20 133 lb (60.3 kg)    BP 122/80    Pulse 73    Temp 98.3 F (36.8 C) (Tympanic)    Ht 5\' 4"  (1.626 m)    Wt 135 lb 9.6 oz (61.5 kg)    LMP 06/20/2017 (Approximate)    SpO2 99%    BMI 23.28 kg/m  Constitutional:  Pleasant female in no  acute distress. Psychiatric: Normal mood and affect. Behavior is normal. EENT: Pupils normal.  Conjunctivae are normal. No scleral icterus. Neck supple.  Cardiovascular: Normal rate, regular rhythm. No edema Pulmonary/chest: Effort normal and breath sounds normal. No wheezing, rales or rhonchi. Abdominal: Soft, nondistended, nontender. Bowel sounds active throughout. There are no masses palpable. No hepatomegaly. Neurological: Alert and oriented to person place and time. Skin: Skin is warm and dry. No rashes noted.  Tye Savoy, NP  04/04/2020, 1:26 PM

## 2020-04-04 ENCOUNTER — Encounter: Payer: Self-pay | Admitting: Nurse Practitioner

## 2020-04-04 NOTE — Progress Notes (Signed)
Above-noted.  Either prolonged acute self-limited diarrheal illness due to neurovirus or postinfectious IBS picture

## 2020-04-05 ENCOUNTER — Encounter (HOSPITAL_BASED_OUTPATIENT_CLINIC_OR_DEPARTMENT_OTHER): Payer: Self-pay | Admitting: *Deleted

## 2020-04-10 MED ORDER — FLUCONAZOLE 150 MG PO TABS: 150.0000 mg | ORAL_TABLET | Freq: Every day | ORAL | 0 refills | Status: DC

## 2020-04-30 ENCOUNTER — Encounter: Payer: Self-pay | Admitting: Family Medicine

## 2020-04-30 ENCOUNTER — Telehealth (INDEPENDENT_AMBULATORY_CARE_PROVIDER_SITE_OTHER): Payer: 59 | Admitting: Family Medicine

## 2020-04-30 DIAGNOSIS — K529 Noninfective gastroenteritis and colitis, unspecified: Secondary | ICD-10-CM

## 2020-04-30 DIAGNOSIS — R5383 Other fatigue: Secondary | ICD-10-CM

## 2020-04-30 NOTE — Patient Instructions (Addendum)
Health Maintenance Due  Topic Date Due  . Hepatitis C Screening Discuss at next in office visit.  Never done  . HIV Screening Discuss at next in office visit.  Never done    Depression screen [facility] 2/9 04/30/2020 03/11/2020 08/25/2016  Decreased Interest 0 0 0  Down, Depressed, Hopeless 0 0 0  PHQ - 2 Score 0 0 0    Recommended follow up: No follow-ups on file.

## 2020-04-30 NOTE — Progress Notes (Signed)
Phone [phone] Virtual visit via Video note   Subjective:  Chief complaint: Chief Complaint  Patient presents with  . Diarrhea    9 1/2 weeks.   . Fatigue   This visit type was conducted due to national recommendations for restrictions regarding the COVID-19 Pandemic (e.g. social distancing).  This format is felt to be most appropriate for this patient at this time balancing risks to patient and risks to population by having him in for in person visit.  No physical exam was performed (except for noted visual exam or audio findings with Telehealth visits).    Our team/I connected with Kimberly Stevens at 10:40 AM EDT by a video enabled telemedicine application (doxy.me or caregility through epic) and verified that I am speaking with the correct person using two identifiers.  Location patient: [place] Location provider: [facility], office Persons participating in the virtual visit:  patient  Our team/I discussed the limitations of evaluation and management by telemedicine and the availability of in person appointments. In light of current covid-19 pandemic, patient also understands that we are trying to protect them by minimizing in office contact if at all possible.  The patient expressed consent for telemedicine visit and agreed to proceed. Patient understands insurance will be billed.   Past Medical History-  Patient Active Problem List   Diagnosis Date Noted  . Biceps tendinopathy of right upper extremity 05/16/2019  . Hamstring strain, subsequent encounter 08/25/2016  . Plantar fasciitis of right foot 05/28/2016  . Acquired hallux limitus of left foot 05/28/2016  . Upper back pain on right side 12/03/2015  . Lateral epicondylitis of right elbow 04/09/2015  . Lateral epicondylitis of left elbow 05/10/2014  . Greater trochanteric bursitis of right hip 12/21/2013  . Patellar tendinitis of right knee 07/20/2013  . Left Achilles tendinitis 11/08/2012  . Pain in joint, pelvic  region and thigh 03/08/2012  . 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 OTHER SYMPTOMS INVOLVING DIGESTIVE SYSTEM OTHER 07/25/2008  . ABDOMINAL PAIN -GENERALIZED 07/25/2008  . EPIGASTRIC PAIN 07/19/2008  . KNEE PAIN, RIGHT 06/08/2008  . TALIPES CAVUS 12/28/2007  . FOOT PAIN, CHRONIC 12/20/2007  . BENIGN NEOPLASM OF UNSPECIFIED SITE 12/13/2006    Medications- reviewed and updated Current Outpatient Medications  Medication Sig Dispense Refill  . erythromycin ophthalmic ointment Apply to sutures 4 times a day for 10-12 days.  Discontinue if allergy develops and call our office 3.5 g 2  . fluticasone (FLONASE) 50 MCG/ACT nasal spray Place 1 spray into both nostrils daily.    . montelukast (SINGULAIR) 10 MG tablet Take 10 mg by mouth at bedtime.    . Multiple Vitamin (MULTIVITAMIN ADULT PO) Take 1 tablet by mouth daily.    . nitrofurantoin (MACRODANTIN) 100 MG capsule Takes 1 tablet after intercourse 30 capsule 1  . Probiotic Product (PROBIOTIC-10 PO) Take 1 tablet by mouth daily.    . Psyllium (METAMUCIL FIBER PO) Take by mouth daily. (Patient not taking: Reported on 04/30/2020)     No current facility-administered medications for this visit.     Objective:  no self reported vitals Gen: NAD, resting comfortably Lungs: nonlabored, normal respiratory rate  Skin: appears dry, no obvious rash     Assessment and Plan  No specialty comments available. #Chronic diarrhea S: Patient was seen on 03/11/2020 for diarrhea of nearly 2 weeks duration which was transitioning to softer stools at that time.  This occurred after she had a blepharoplasty but no antibiotics were used.  Patient did not have issues with anesthesia.  Developed explosive diarrhea about a week later after having a salad from [employer].  At that time she had 12 days of symptoms transitioning from watery diarrhea to loose stools.  Her baseline tends toward constipation.  No dark black stool or melena.  No mucus  in the stool at that time.  She is having a lot of gaseous distention as well.  Was having some chills with no fever.  She saw her gastroenterologist who thought it was initially antibiotics related to her surgery but once again eventually discovered that she did not have any antibiotics.  There have been some consideration of using antibiotic metronidazole.  Patient was continued on thyroid supplement.  She also did not feel comfortable getting Kimberly Stevens from the bathroom-with some urgency.  Covid testing was negative at that time including rapid test and PCR   She was able to eat normal dinners with no significant issues-we transitioned her to more of a brat type diet.  Also discussed using Imodium.  Eventually we did stool studies due to ongoing symptoms and this was positive for neurovirus on both 3/7 and 03/27/2020  Patient saw gastroenterology on 04/03/2020-patient was tender on exam and GI also thought the duration of loose stools was surprising.  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  They recommended continued hydration and probiotics with my chart update within a few weeks if not improving.  Today patient reports: 9.5 weeks of diarrhea. She has removed a lot of things from diet. Already dairy free. Has gone gluten free. Has gone low FODMAP on her diet. This morning woke up with gassiness and liquidy slight discharge with flatulence. Is having some normal stools. The last few days she is feeling really tired. Some abdominal pain in lower abdomen that feels gas related. After eating can feel some upper abdominal distension.  -also stopped metamucil and probiotic today  A/P: 55 year old female with 9 weeks of significant change in her GI patterns.  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  She has tried elimination diets including  gluten-free and low FODMAP.  I think at this point we need to get GIs opinion on if she needs a colonoscopy or other testing.  I am also going to update some blood work and she is also having some fatigue.  If GI work-up is reassuring I would also consider pelvic ultrasound to evaluate ovaries.  We have a physical in May and we will discuss at that point   I sent the following message to Kimberly Stevens " Kimberly Stevens- patient with 9.5 weeks of significant change in bowel habits/GI symptoms. She is having bloating, flatulence that can contain liquid discharge, lower abdominal pain. Previously was having regular diarrhea and then loose stools. Saw Kimberly Stevens about a month ago and diagnosed with post infectious IBS (patient tested postive for norovirus in march) but symptoms had started February 5th (started after a salad at [employer]).   I am reaching out to see if you think we should have her scheduled for colonoscopy or if you prefer to sit down with her first. She has already tried fodmap diet, gluten free, was previously dairy free. I think she has done a great job on elimination.   We are going to check CBC, CMP, TSH. If there is any other bloodwork you would like  I can try to add that as well. I did not plan to repeat stool studies unless you suggest (GI pathogen panel before)   Look forward to hearing your thoughts,  Kimberly Stevens "    Recommended follow up: keep may follow up  Future Appointments  Date Time Provider [place]  06/10/2020  4:00 PM Marin Olp, MD [facility]    Lab/Order associations:   ICD-10-CM   1. Chronic diarrhea  K52.9 CBC with Differential/Platelet    Comprehensive metabolic panel    TSH  2. Fatigue, unspecified type  R53.83 CBC with Differential/Platelet    Comprehensive metabolic panel    TSH    Time Spent: 20 minutes of total time (10:34 AM- 10:54 AM) was spent on the date of the encounter performing the following actions: chart review prior to  seeing the patient, obtaining history, performing a medically necessary exam, counseling on the treatment plan, placing orders, and documenting in our EHR.   Return precautions advised.  Kimberly Reddish, MD

## 2020-05-02 ENCOUNTER — Telehealth: Payer: Self-pay

## 2020-05-02 NOTE — Telephone Encounter (Signed)
-----   Message from Irene Shipper, MD sent at 04/30/2020 11:05 AM EDT ----- Annie Main, I am familiar with her case.  Actually, I saw her before Nevin Bloodgood.  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  However, at this point, I would advocate moving forward with colonoscopy with biopsies to rule out other entities such as microscopic colitis. Thanks, Barbette Merino, Please contact patient.  Let her know that I have reviewed events since I saw her (including Paula's evaluation).  Also, let her know that I "spoke" with Dr. Yong Channel.  At this point I would like to move forward with colonoscopy, which could be scheduled directly.  However, if she prefers seeing me in the office prior, to discuss, that would be fine. Thanks, Dr. Henrene Pastor  ----- Message ----- From: Marin Olp, MD Sent: 04/30/2020  10:45 AM EDT To: Irene Shipper, MD  Dr. Henrene Pastor- patient with 9.5 weeks of significant change in bowel habits/GI symptoms. She is having bloating, flatulence that can contain liquid discharge, lower abdominal pain. Previously was having regular diarrhea and then loose stools. Saw Tye Savoy about a month ago and diagnosed with post infectious IBS (patient tested postive for norovirus in march) but symptoms had started February 5th (started after a salad at [employer]).   I am reaching out to see if you think we should have her scheduled for colonoscopy or if you prefer to sit down with her first. She has already tried fodmap diet, gluten free, was previously dairy free. I think she has done a great job on elimination.   We are going to check CBC, CMP, TSH. If there is any other bloodwork you would like I can try to add that as well. I did not plan to repeat stool studies unless you suggest (GI pathogen panel before)   Look forward to hearing your thoughts,  Garret Reddish

## 2020-05-02 NOTE — Telephone Encounter (Signed)
That slot is on hold for ?meeting. Is it ok for me to use it?

## 2020-05-02 NOTE — Telephone Encounter (Signed)
May 5 afternoon slot

## 2020-05-02 NOTE — Telephone Encounter (Signed)
I offered that slot and she will be out of town for a wedding.

## 2020-05-02 NOTE — Telephone Encounter (Signed)
Spoke with pt and she is aware. She would prefer to go ahead and schedule colon and would like to get it done soon. Would a 7:30am appt be an option? Please advise.

## 2020-05-06 ENCOUNTER — Other Ambulatory Visit: Payer: Self-pay

## 2020-05-06 ENCOUNTER — Other Ambulatory Visit (INDEPENDENT_AMBULATORY_CARE_PROVIDER_SITE_OTHER): Payer: 59

## 2020-05-06 ENCOUNTER — Encounter: Payer: Self-pay | Admitting: Family Medicine

## 2020-05-06 DIAGNOSIS — K529 Noninfective gastroenteritis and colitis, unspecified: Secondary | ICD-10-CM

## 2020-05-06 DIAGNOSIS — R5383 Other fatigue: Secondary | ICD-10-CM

## 2020-05-06 NOTE — Telephone Encounter (Signed)
Pt scheduled for previsit 05/07/20@11am , Colon scheduled with Dr. Henrene Pastor 05/23/20@3pm . Pt aware of appts.

## 2020-05-07 ENCOUNTER — Other Ambulatory Visit: Payer: Self-pay

## 2020-05-07 ENCOUNTER — Ambulatory Visit (AMBULATORY_SURGERY_CENTER): Payer: Self-pay

## 2020-05-07 VITALS — Ht 64.0 in | Wt 131.0 lb

## 2020-05-07 DIAGNOSIS — R194 Change in bowel habit: Secondary | ICD-10-CM

## 2020-05-07 DIAGNOSIS — Z1211 Encounter for screening for malignant neoplasm of colon: Secondary | ICD-10-CM

## 2020-05-07 DIAGNOSIS — A09 Infectious gastroenteritis and colitis, unspecified: Secondary | ICD-10-CM

## 2020-05-07 LAB — COMPREHENSIVE METABOLIC PANEL
ALT: 16 U/L (ref 0–35)
AST: 20 U/L (ref 0–37)
Albumin: 4.3 g/dL (ref 3.5–5.2)
Alkaline Phosphatase: 53 U/L (ref 39–117)
BUN: 18 mg/dL (ref 6–23)
CO2: 28 mEq/L (ref 19–32)
Calcium: 9.3 mg/dL (ref 8.4–10.5)
Chloride: 103 mEq/L (ref 96–112)
Creatinine, Ser: 0.84 mg/dL (ref 0.40–1.20)
GFR: 78.06 mL/min (ref >60.00)
Glucose, Bld: 77 mg/dL (ref 70–99)
Potassium: 4.5 mEq/L (ref 3.5–5.1)
Sodium: 137 mEq/L (ref 135–145)
Total Bilirubin: 0.3 mg/dL (ref 0.2–1.2)
Total Protein: 6.8 g/dL (ref 6.0–8.3)

## 2020-05-07 LAB — CBC WITH DIFFERENTIAL/PLATELET
Basophils Absolute: 0.1 10*3/uL (ref 0.0–0.1)
Basophils Relative: 1.3 % (ref 0.0–3.0)
Eosinophils Absolute: 0.2 10*3/uL (ref 0.0–0.7)
Eosinophils Relative: 3.1 % (ref 0.0–5.0)
HCT: 43.1 % (ref 36.0–46.0)
Hemoglobin: 14.6 g/dL (ref 12.0–15.0)
Lymphocytes Relative: 27.2 % (ref 12.0–46.0)
Lymphs Abs: 1.9 10*3/uL (ref 0.7–4.0)
MCHC: 33.8 g/dL (ref 30.0–36.0)
MCV: 98 fl (ref 78.0–100.0)
Monocytes Absolute: 0.5 10*3/uL (ref 0.1–1.0)
Monocytes Relative: 6.5 % (ref 3.0–12.0)
Neutro Abs: 4.4 10*3/uL (ref 1.4–7.7)
Neutrophils Relative %: 61.9 % (ref 43.0–77.0)
Platelets: 219 10*3/uL (ref 150.0–400.0)
RBC: 4.4 Mil/uL (ref 3.87–5.11)
RDW: 13.2 % (ref 11.5–15.5)
WBC: 7.1 10*3/uL (ref 4.0–10.5)

## 2020-05-07 LAB — TSH: TSH: 1.83 u[IU]/mL (ref 0.35–4.50)

## 2020-05-07 MED ORDER — SUTAB 1479-225-188 MG PO TABS: 1.0000 | ORAL_TABLET | ORAL | 0 refills | Status: DC

## 2020-05-07 NOTE — Progress Notes (Signed)
No egg or soy allergy known to patient  No issues with past sedation with any surgeries or procedures Patient denies ever being told they had issues or difficulty with intubation  No FH of Malignant Hyperthermia No diet pills per patient No home 02 use per patient  No blood thinners per patient  Pt denies issues with constipation  No A fib or A flutter  EMMI video via MyChart  COVID 19 guidelines implemented in PV today with Pt and RN  Pt is fully vaccinated  for Covid  Coupon given to pt in PV today, Code to Pharmacy and NO PA's for preps discussed with pt In PV today  Discussed with pt there will be an out-of-pocket cost for prep and that varies from $0 to 70 dollars  Due to the COVID-19 pandemic we are asking patients to follow certain guidelines.  Pt aware of COVID protocols and [facility] guidelines

## 2020-05-17 NOTE — Telephone Encounter (Signed)
Patient has been added to the wait list

## 2020-05-21 ENCOUNTER — Encounter: Payer: Self-pay | Admitting: Internal Medicine

## 2020-05-23 ENCOUNTER — Ambulatory Visit (AMBULATORY_SURGERY_CENTER): Payer: 59 | Admitting: Internal Medicine

## 2020-05-23 ENCOUNTER — Encounter: Payer: Self-pay | Admitting: Internal Medicine

## 2020-05-23 ENCOUNTER — Other Ambulatory Visit: Payer: Self-pay

## 2020-05-23 VITALS — BP 106/67 | HR 55 | Temp 97.3°F | Resp 13 | Ht 64.0 in | Wt 131.0 lb

## 2020-05-23 DIAGNOSIS — K635 Polyp of colon: Secondary | ICD-10-CM

## 2020-05-23 DIAGNOSIS — R197 Diarrhea, unspecified: Secondary | ICD-10-CM | POA: Diagnosis present

## 2020-05-23 DIAGNOSIS — R194 Change in bowel habit: Secondary | ICD-10-CM

## 2020-05-23 DIAGNOSIS — A09 Infectious gastroenteritis and colitis, unspecified: Secondary | ICD-10-CM

## 2020-05-23 DIAGNOSIS — D120 Benign neoplasm of cecum: Secondary | ICD-10-CM

## 2020-05-23 MED ORDER — METRONIDAZOLE 250 MG PO TABS: 250.0000 mg | ORAL_TABLET | Freq: Three times a day (TID) | ORAL | 0 refills | Status: DC

## 2020-05-23 MED ORDER — SODIUM CHLORIDE 0.9 % IV SOLN: 500.0000 mL | Freq: Once | INTRAVENOUS | Status: DC

## 2020-05-23 NOTE — Progress Notes (Signed)
Report to PACU, RN, vss, BBS= Clear.  

## 2020-05-23 NOTE — Progress Notes (Signed)
Medical history reviewed with no changes noted. VS assessed by C.W 

## 2020-05-23 NOTE — Op Note (Signed)
[facility] Patient Name: Kimberly Stevens Procedure Date: 05/23/2020 3:08 PM MRN: [id number] Endoscopist: Docia Chuck. Henrene Pastor , MD Age: 55 Referring MD:  Date of Birth: 1965/01/28 Gender: Female Account #: [id number] Procedure:                Colonoscopy with cold snare polypectomy x 1; with                            biopsies Indications:              Chronic diarrhea. GI pathogen panel positive for                            norovirus. Medicines:                Monitored Anesthesia Care Procedure:                Pre-Anesthesia Assessment:                           - Prior to the procedure, a History and Physical                            was performed, and patient medications and                            allergies were reviewed. The patient's tolerance of                            previous anesthesia was also reviewed. The risks                            and benefits of the procedure and the sedation                            options and risks were discussed with the patient.                            All questions were answered, and informed consent                            was obtained. Prior Anticoagulants: The patient has                            taken no previous anticoagulant or antiplatelet                            agents. ASA Grade Assessment: I - A normal, healthy                            patient. After reviewing the risks and benefits,                            the patient was deemed in satisfactory condition to  undergo the procedure.                           After obtaining informed consent, the colonoscope                            was passed under direct vision. Throughout the                            procedure, the patient's blood pressure, pulse, and                            oxygen saturations were monitored continuously. The                            Olympus CF-HQ190 [id number]) Colonoscope was                             introduced through the anus and advanced to the the                            cecum, identified by appendiceal orifice and                            ileocecal valve. The ileocecal valve, appendiceal                            orifice, and rectum were photographed. The quality                            of the bowel preparation was excellent. The                            colonoscopy was performed without difficulty. The                            patient tolerated the procedure well. The bowel                            preparation used was SUPREP via split dose                            instruction. Scope In: 3:24:05 PM Scope Out: 3:39:37 PM Scope Withdrawal Time: 0 hours 9 minutes 44 seconds  Total Procedure Duration: 0 hours 15 minutes 32 seconds  Findings:                 A 3 mm polyp was found in the cecum. The polyp was                            sessile. The polyp was removed with a cold snare.                            Resection and retrieval were complete.  The entire examined colon appeared otherwise normal                            on direct and retroflexion views. Biopsies were                            taken with a cold forceps for histology to rule out                            microscopic colitis. Complications:            No immediate complications. Estimated blood loss:                            None. Estimated Blood Loss:     Estimated blood loss: none. Impression:               - One 3 mm polyp in the cecum, removed with a cold                            snare. Resected and retrieved.                           - The entire examined colon is otherwise normal on                            direct and retroflexion views. Recommendation:           - Repeat colonoscopy in 7-10 years for surveillance.                           - Patient has a contact number available for                            emergencies. The signs and symptoms of  potential                            delayed complications were discussed with the                            patient. Return to normal activities tomorrow.                            Written discharge instructions were provided to the                            patient.                           - Resume previous diet.                           - Continue present medications.                           - Await pathology results. We will contact you with  the results and further recommendations.                           -Prescribe metronidazole 250 mg p.o. 3 times daily;                            #21; no refills                           -Okay to use Imodium if needed for diarrhea Docia Chuck. Henrene Pastor, MD 05/23/2020 3:48:00 PM This report has been signed electronically.

## 2020-05-23 NOTE — Patient Instructions (Signed)
Handout on polyps given. ° °YOU HAD AN ENDOSCOPIC PROCEDURE TODAY AT THE [facility]:   Refer to the procedure report that was given to you for any specific questions about what was found during the examination.  If the procedure report does not answer your questions, please call your gastroenterologist to clarify.  If you requested that your care partner not be given the details of your procedure findings, then the procedure report has been included in a sealed envelope for you to review at your convenience later. ° °YOU SHOULD EXPECT: Some feelings of bloating in the abdomen. Passage of more gas than usual.  Walking can help get rid of the air that was put into your GI tract during the procedure and reduce the bloating. If you had a lower endoscopy (such as a colonoscopy or flexible sigmoidoscopy) you may notice spotting of blood in your stool or on the toilet paper. If you underwent a bowel prep for your procedure, you may not have a normal bowel movement for a few days. ° °Please Note:  You might notice some irritation and congestion in your nose or some drainage.  This is from the oxygen used during your procedure.  There is no need for concern and it should clear up in a day or so. ° °SYMPTOMS TO REPORT IMMEDIATELY: ° °Following lower endoscopy (colonoscopy or flexible sigmoidoscopy): ° Excessive amounts of blood in the stool ° Significant tenderness or worsening of abdominal pains ° Swelling of the abdomen that is new, acute ° Fever of 100°F or higher ° °For urgent or emergent issues, a gastroenterologist can be reached at any hour by calling [phone]. °Do not use MyChart messaging for urgent concerns.  ° ° °DIET:  We do recommend a small meal at first, but then you may proceed to your regular diet.  Drink plenty of fluids but you should avoid alcoholic beverages for 24 hours. ° °ACTIVITY:  You should plan to take it easy for the rest of today and you should NOT DRIVE or use heavy machinery  until tomorrow (because of the sedation medicines used during the test).   ° °FOLLOW UP: °Our staff will call the number listed on your records 48-72 hours following your procedure to check on you and address any questions or concerns that you may have regarding the information given to you following your procedure. If we do not reach you, we will leave a message.  We will attempt to reach you two times.  During this call, we will ask if you have developed any symptoms of COVID 19. If you develop any symptoms (ie: fever, flu-like symptoms, shortness of breath, cough etc.) before then, please call [phone].  If you test positive for Covid 19 in the 2 weeks post procedure, please call and report this information to us.   ° °If any biopsies were taken you will be contacted by phone or by letter within the next 1-3 weeks.  Please call us at [phone] if you have not heard about the biopsies in 3 weeks.  ° ° °SIGNATURES/CONFIDENTIALITY: °You and/or your care partner have signed paperwork which will be entered into your electronic medical record.  These signatures attest to the fact that that the information above on your After Visit Summary has been reviewed and is understood.  Full responsibility of the confidentiality of this discharge information lies with you and/or your care-partner.  °

## 2020-05-23 NOTE — Progress Notes (Signed)
Called to room to assist during endoscopic procedure.  Patient ID and intended procedure confirmed with present staff. Received instructions for my participation in the procedure from the performing physician.  

## 2020-05-27 ENCOUNTER — Telehealth: Payer: Self-pay | Admitting: *Deleted

## 2020-05-27 NOTE — Telephone Encounter (Signed)
LVM

## 2020-05-27 NOTE — Telephone Encounter (Signed)
Attempted f/u phone call. No answer. Left message. °

## 2020-05-29 ENCOUNTER — Encounter: Payer: Self-pay | Admitting: Internal Medicine

## 2020-06-10 ENCOUNTER — Ambulatory Visit: Payer: 59 | Admitting: Family Medicine

## 2020-07-16 ENCOUNTER — Other Ambulatory Visit: Payer: Self-pay | Admitting: Obstetrics & Gynecology

## 2020-07-16 DIAGNOSIS — Z1231 Encounter for screening mammogram for malignant neoplasm of breast: Secondary | ICD-10-CM

## 2020-09-09 ENCOUNTER — Encounter: Payer: Self-pay | Admitting: Family Medicine

## 2020-09-09 ENCOUNTER — Ambulatory Visit: Payer: 59 | Admitting: Family Medicine

## 2020-09-09 ENCOUNTER — Other Ambulatory Visit: Payer: Self-pay

## 2020-09-09 ENCOUNTER — Ambulatory Visit
Admission: RE | Admit: 2020-09-09 | Discharge: 2020-09-09 | Disposition: A | Discharge status: Home / Self Care | Payer: 59 | Source: Ambulatory Visit | Attending: Obstetrics & Gynecology | Admitting: Obstetrics & Gynecology

## 2020-09-09 VITALS — BP 114/71 | HR 51 | Temp 98.0°F | Ht 64.0 in | Wt 134.0 lb

## 2020-09-09 DIAGNOSIS — Z0000 Encounter for general adult medical examination without abnormal findings: Secondary | ICD-10-CM | POA: Diagnosis not present

## 2020-09-09 DIAGNOSIS — K529 Noninfective gastroenteritis and colitis, unspecified: Secondary | ICD-10-CM | POA: Diagnosis not present

## 2020-09-09 DIAGNOSIS — Z780 Asymptomatic menopausal state: Secondary | ICD-10-CM

## 2020-09-09 DIAGNOSIS — Z1231 Encounter for screening mammogram for malignant neoplasm of breast: Secondary | ICD-10-CM

## 2020-09-09 DIAGNOSIS — Z8262 Family history of osteoporosis: Secondary | ICD-10-CM

## 2020-09-09 DIAGNOSIS — N390 Urinary tract infection, site not specified: Secondary | ICD-10-CM | POA: Diagnosis not present

## 2020-09-09 DIAGNOSIS — J302 Other seasonal allergic rhinitis: Secondary | ICD-10-CM

## 2020-09-09 NOTE — Progress Notes (Signed)
Phone [phone]   Subjective:  Patient presents today for their annual physical and to transition care from prior PCP. Chief complaint-noted.   See problem oriented charting- ROS- full  review of systems was completed and negative except for: seasonal allergies, toe joint swelling, loose stools/diarrhea, bloating  The following were reviewed and entered/updated in epic: Past Medical History:  Diagnosis Date   Allergy    seasonal allergies   Arthritis    left great toe joint   Broken arm    right elbow & wrist   Colon polyps    benign   Depression    hx of post partum   Dermoid cyst of ovary    Eye disorder    possible glaucoma-being followed by Dr Tyrone Schimke   Fibrocystic breast    IBS (irritable bowel syndrome)    Plantar fasciitis    left foot 2/15-11/15   Polyp of duodenum 2014   tiny   Tendonitis    right knee 2/15-11/15   Torn rotator cuff    right   Patient Active Problem List   Diagnosis Date Noted   Seasonal allergies 09/09/2020    Priority: Medium   Postcoital UTI 09/09/2020    Priority: Low   Strain of hamstring, subsequent encounter 08/25/2016    Priority: Low   Plantar fasciitis of right foot 05/28/2016    Priority: Low   Acquired hallux limitus of left foot 05/28/2016    Priority: Low   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 shoulder, knee surgery.   Multiple  myeloma Father        69 in 2022. around mid 8s.   Diverticulitis Father    Hearing loss Father        and poor vision   Hepatitis Father        B she believes   Healthy Sister    Healthy Sister    Arthritis Maternal Grandmother    Heart attack Maternal Grandmother        age 65   Arthritis Paternal Grandmother    Leukemia Maternal Uncle    Colon cancer Neg Hx    Colon polyps Neg Hx    Liver disease Neg Hx    Pancreatic cancer Neg Hx    Esophageal cancer Neg Hx    Stomach cancer Neg Hx    Rectal cancer Neg Hx     Medications- reviewed and updated Current Outpatient Medications  Medication Sig Dispense Refill   fluticasone (FLONASE) 50 MCG/ACT nasal spray Place 1 spray into both nostrils daily.     nitrofurantoin (MACRODANTIN) 100 MG capsule Takes 1 tablet after intercourse 30 capsule 1   montelukast (SINGULAIR) 10 MG tablet Take 10 mg by mouth at bedtime. (Patient not taking: No sig reported)     No current facility-administered medications for this  visit.    Allergies-reviewed and updated No Known Allergies  Social History   Social History Narrative   Married- husband Cecilie Lowers patient of Dr. Yong Channel. 2 children- daughter 42- from [place] to [place] then [place]- getting married, son 31 in [place] in 2022      Financial controller at [employer]: peloton, tennis, walking daily, swimming, reading, Bible study, hiking   Objective  Objective:  BP 114/71   Pulse (!) 51   Temp 98 F (36.7 C) (Temporal)   Ht _0  (1.626 m)   Wt 134 lb (60.8 kg)   LMP 06/20/2017 (Approximate)   SpO2 99%   BMI 23.00 kg/m  Gen: NAD, resting comfortably HEENT: Mucous membranes are moist. Oropharynx normal Neck: no thyromegaly CV: Bradycardic but regular no murmurs rubs or gallops Lungs: CTAB no crackles, wheeze, rhonchi Abdomen: soft/nontender/nondistended/normal bowel sounds. No rebound or guarding.  Ext: no edema Skin: warm, dry Neuro: grossly normal, moves all extremities,  PERRLA   Assessment and Plan   55 y.o. female presenting for annual physical.  Health Maintenance counseling: 1. Anticipatory guidance: Patient counseled regarding regular dental exams -q6 months, eye exams - yearly,  avoiding smoking and second hand smoke , limiting alcohol to 1 beverage per day, no illicit drugs .   2. Risk factor reduction:  Advised patient of need for regular exercise and diet rich and fruits and vegetables to reduce risk of heart attack and stroke. Exercise- excellent - usually twice daily. Diet-very healthy diet- particularly pre wedding.  Wt Readings from Last 3 Encounters:  09/09/20 134 lb (60.8 kg)  05/23/20 131 lb (59.4 kg)  05/07/20 131 lb (59.4 kg)  3. Immunizations/screenings/ancillary studies- recommended covid 19 vaccine #4 when new variant specific vaccine comes out. Fall flu shot recommended. Declines HCV screen Immunization History  Administered Date(s) Administered   Influenza, High Dose Seasonal PF 04/06/2019   Influenza,inj,Quad PF,6+ Mos 10/26/2017, 09/13/2018   Influenza-Unspecified 10/30/2016   Moderna Sars-Covid-2 Vaccination 03/22/2019, 04/18/2019, 12/27/2019   Tdap 11/15/2012   Zoster Recombinat (Shingrix) 12/19/2017, 12/11/2018  4. Cervical cancer screening- 10/09/19- good for 5 years as HPV negative 5. Breast cancer screening-  breast exam with Dr. [name] Heck and mammogram - mammogram today with breast center 6. Colon cancer screening -  #history of sessile serrated polyp- 05/23/20 with Dr. Henrene Pastor with 7 year repeat 7. Skin cancer screening- sees derm yearly. advised regular sunscreen use. Denies worrisome, changing, or new skin lesions.  8. Birth control/STD check- postmenopausal and hsubadn with vasectomy. monogamous 9. Osteoporosis screening at 50- family history of osteoporosis and post menopausal - we will check bone density -never smoker  Status of chronic or acute concerns   #Mild hyperlipidemia-discussed new guidelines with LDL goal being  under 70 and her last LDL at 97.  Her 10-year risk of heart attack or stroke is only 0.9% and certainly not in range where she needs to consider statin  # Change in bowel habits- now tends toward diarrhea S:Patient was seen on 03/11/2020 for diarrhea of nearly 2 weeks duration which transitioned to softer stools at that time. This occurred after she had a blepharoplasty but no antibiotics were used. Patient did not have any issues with the anesthesia. She developed explosive diarrhea about a week later after eating a salad from [employer]. She started experiencing 12 days of symptoms that transitioned from watery diarrhea to loose stools. She also reported having a lot of gaseous distention. She reported having some chills however no  fever. Her baseline tended towards constipation. No dark stool or melena. No mucus in the stool.  - She was seen by her gastroenterologist who thought it was the initial antibiotics related to her surgery but once again discovered that she did not have any antibiotics at later date.  There had been some consideration of using antibiotic metronidazole.  Patient was continued on thyroid supplement.  She also did not feel comfortable getting [employer] from the bathroom-with some urgency.  - Covid testing was negative at that time including rapid test and PCR .She was able to eat normal dinners with no significant issues-we transitioned her to more of a brat type diet.  Also discussed to use Imodium. Eventually did a  stool studies due to ongoing symptoms and was positive for neurovirus on both 03/25/2020 and 03/27/2020. CBC/CMP/TSH reassuring. Stool studies did show persistent norovirus on 2 separate profiles March 7 and March 9 though had been negative on March 4.   -Patient was seen by gastroenterology on 04/03/2020-patient was tender on exam and GI also thought the duration of loose stools was surprising.  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    04/30/2020:  - Patient reported to have 9.5 weeks of diarrhea. She removed a lot of things from diet. Already dairy free and went gluten free. She had gone low FODMAP on her diet. That morning, she woke up with gassiness and liquidy slight discharge with flatulence. She started experiencing having some normal stools. The last few days ,she was feeling really tired. Some abdominal pain in lower abdomen that felt gas related. After eating , she felt some upper abdominal distension.  -also stopped metamucil and probiotic at that time.  -Colonoscopy was on 05/23/2020.  Reassuring colonoscopy other than sessile serrated polyp-other random biopsies without significant underlying abnormalities  Today she is still experiencing looser stools. Uses bristol stool chart and anywaher from a 4 to a 6. Used to be more 2-3.  Recalls trying to cut gluten, dairy, diet coke, low fodmap- didn't significantly improve symptoms.hot chocolate and pizza recently did make symptoms worse. Goes once or twice a day at present but feels like could go more often. Imodium does help - gel tab works better when used- took one last night and better today. Still getting lower abdominal cramping and bloating. Has not seen GYN during this time.   Husband and dog's stools have been looser at times (dog now better)- husbands stools have been abnormal for 2-3 months. Similar issues with frequency, sputtering, gassiness, looser stools- not as much urgency. Have felt worse on vacatoins.   Does tend to manifest stress with GI issues. Some increased stress with daughter getting married soon- Feb 22 2021 along with aging parents.   Dermoid cyst monitored by GYN  Listed as IBS previously on reviewing history  A/P: 55 year old female with IBS listed on medical history who has had updated colonoscopy with no clear explanation for her change in bowel habits.  Certainly could be IBS or something environmental since husband is having  some similar symptoms.  With bloating and lower abdominal cramping I also reached out to Dr. [name] Heck to see her opinion on potentially repeating pelvic ultrasound for prior ovarian cyst.  I think it is most likely these are GI related symptoms and possible IBS-I think a trial of IBgard would be reasonable at this point-patient is in agreement to trial this  Recommended follow up: Return in about 1 year (around 09/09/2021)  for physical or sooner if needed.  Lab/Order associations:   ICD-10-CM   1. Preventative health care  Z00.00     2. Chronic diarrhea  K52.9     3. Seasonal allergies  J30.2     4. Postcoital UTI  N39.0     5. Postmenopausal  Z78.0 DG Bone Density    6. Family history of osteoporosis  Z82.62 DG Bone Density      No orders of the defined types were placed in this encounter.  I,Jada Bradford,acting as a scribe for Garret Reddish, MD.,have documented all relevant documentation on the behalf of Garret Reddish, MD,as directed by  Garret Reddish, MD while in the presence of Garret Reddish, MD.   I, Garret Reddish, MD, have reviewed all documentation for this visit. The documentation on 09/09/20 for the exam, diagnosis, procedures, and orders are all accurate and complete.   Return precautions advised.  Garret Reddish, MD

## 2020-09-09 NOTE — Patient Instructions (Addendum)
Health Maintenance Due  Topic Date Due   COVID-19 Vaccine   recommended covid 19 vaccine #4 when new variant specific vaccine comes out 04/26/2020   INFLUENZA VACCINE Not available in office YET. Please let us know when you get this.  08/19/2020   Team please refund copay at the desk  Schedule your bone density test at check out desk. You may also call directly to X-ray at [phone] to schedule an appointment that is convenient for you.  - located [address] across the street from [place] - in the basement - you do need an appointment for the bone density tests.   Recommended follow up: Return in about 1 year (around 09/09/2021) for physical or sooner if needed.

## 2020-09-18 ENCOUNTER — Telehealth (HOSPITAL_BASED_OUTPATIENT_CLINIC_OR_DEPARTMENT_OTHER): Payer: Self-pay | Admitting: Obstetrics & Gynecology

## 2020-09-18 NOTE — Telephone Encounter (Signed)
Called patient and left message to please call the office back to set up her appointment for the ultrasound.

## 2020-09-19 ENCOUNTER — Other Ambulatory Visit: Payer: Self-pay

## 2020-09-19 ENCOUNTER — Ambulatory Visit (INDEPENDENT_AMBULATORY_CARE_PROVIDER_SITE_OTHER)
Admission: RE | Admit: 2020-09-19 | Discharge: 2020-09-19 | Disposition: A | Discharge status: Home / Self Care | Payer: 59 | Source: Ambulatory Visit | Attending: Family Medicine | Admitting: Family Medicine

## 2020-09-19 DIAGNOSIS — Z780 Asymptomatic menopausal state: Secondary | ICD-10-CM

## 2020-09-19 DIAGNOSIS — Z8262 Family history of osteoporosis: Secondary | ICD-10-CM

## 2020-09-20 ENCOUNTER — Telehealth (HOSPITAL_BASED_OUTPATIENT_CLINIC_OR_DEPARTMENT_OTHER): Payer: Self-pay | Admitting: Obstetrics & Gynecology

## 2020-09-20 NOTE — Telephone Encounter (Signed)
Called patient and left message to call the office to set up ultrasound appointment and meet with Dr.Miller afterwards.

## 2020-09-25 NOTE — Telephone Encounter (Signed)
Pt is scheduled for 11/20/20

## 2020-09-29 ENCOUNTER — Other Ambulatory Visit (HOSPITAL_BASED_OUTPATIENT_CLINIC_OR_DEPARTMENT_OTHER): Payer: Self-pay | Admitting: Obstetrics & Gynecology

## 2020-09-29 DIAGNOSIS — R140 Abdominal distension (gaseous): Secondary | ICD-10-CM

## 2020-09-29 NOTE — Progress Notes (Signed)
Ultrasound order placed. 

## 2020-10-17 ENCOUNTER — Telehealth (HOSPITAL_BASED_OUTPATIENT_CLINIC_OR_DEPARTMENT_OTHER): Payer: Self-pay | Admitting: Obstetrics & Gynecology

## 2020-10-17 NOTE — Telephone Encounter (Signed)
Called and left a message to please call the office back to rescheduled the ultrasound .

## 2020-10-18 ENCOUNTER — Encounter: Payer: Self-pay | Admitting: Family Medicine

## 2020-10-21 ENCOUNTER — Ambulatory Visit (HOSPITAL_BASED_OUTPATIENT_CLINIC_OR_DEPARTMENT_OTHER): Payer: 59 | Admitting: Obstetrics & Gynecology

## 2020-10-23 ENCOUNTER — Ambulatory Visit (INDEPENDENT_AMBULATORY_CARE_PROVIDER_SITE_OTHER): Payer: 59 | Admitting: Obstetrics & Gynecology

## 2020-10-23 ENCOUNTER — Other Ambulatory Visit: Payer: Self-pay

## 2020-10-23 ENCOUNTER — Ambulatory Visit (INDEPENDENT_AMBULATORY_CARE_PROVIDER_SITE_OTHER): Payer: 59

## 2020-10-23 DIAGNOSIS — D270 Benign neoplasm of right ovary: Secondary | ICD-10-CM

## 2020-10-23 DIAGNOSIS — R140 Abdominal distension (gaseous): Secondary | ICD-10-CM

## 2020-10-24 ENCOUNTER — Other Ambulatory Visit (HOSPITAL_BASED_OUTPATIENT_CLINIC_OR_DEPARTMENT_OTHER): Payer: 59

## 2020-10-24 LAB — CA 125: Cancer Antigen (CA) 125: 10.7 U/mL (ref 0.0–38.1)

## 2020-10-26 ENCOUNTER — Encounter (HOSPITAL_BASED_OUTPATIENT_CLINIC_OR_DEPARTMENT_OTHER): Payer: Self-pay | Admitting: Obstetrics & Gynecology

## 2020-10-26 NOTE — Progress Notes (Signed)
GYNECOLOGY  VISIT  CC:   abdominal bloating, h/o dermoid, ovarian cancer concerns  HPI: 55 y.o. G24P2002 Married White or Caucasian female here for concerns about persistent abdominal bloating.  Pt is now dairy free and working on being gluten free.  These do not seem to have made much difference.  Her last colonoscopy was 05/23/2020.  She has been concerned about possible ovarian cancer.  She does have hx of ovarian dermoid as well.  Ultrasound results reviewed.  Uterus normal.  Right ovary contains the same, small dermoid as well as a 51m simple ovarian cyst.  Left ovary is normal.  There is no free fluid.  No abnormal blood flow is present to either ovary.  Images reviewed with pt.  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 10/06/2011   Talipes cavus 12/28/2007   FOOT PAIN, CHRONIC 12/20/2007    Past Medical History:  Diagnosis Date   Allergy    seasonal allergies   Arthritis    left great toe joint   Broken arm    right elbow & wrist   Colon polyps    benign   Depression    hx of post partum   Dermoid cyst of ovary    Eye disorder    possible glaucoma-being followed by Dr LTyrone Schimke  Fibrocystic breast    IBS (irritable bowel syndrome)    Plantar fasciitis    left foot 2/15-11/15   Polyp of duodenum 2014   tiny   Tendonitis    right knee 2/15-11/15   Torn rotator cuff    right    Past Surgical History:  Procedure Laterality Date   BROW LIFT Bilateral 02/22/2020   Procedure: BLEPHAROPLASTY UPPER EYELID; W/EXCESS SKIN BILATERAL;  Surgeon: FKarle Starch MD;  Location: [facility]  Service: Ophthalmology;  Laterality: Bilateral;   COLONOSCOPY  2014   JP-MAC-movi(exc)-poylpoid   POLYPECTOMY  2014    polypoid   WISDOM TOOTH EXTRACTION      MEDS:   Current Outpatient Medications on File Prior to Visit  Medication Sig Dispense Refill   fluticasone (FLONASE) 50 MCG/ACT nasal spray Place 1 spray into both nostrils daily.     montelukast (SINGULAIR) 10 MG tablet Take 10 mg by mouth at bedtime. (Patient not taking: No sig reported)     nitrofurantoin (MACRODANTIN) 100 MG capsule Takes 1 tablet after intercourse 30 capsule 1   No current facility-administered medications on file prior to visit.    ALLERGIES: Patient has no known allergies.  Family History  Problem Relation Age of Onset   Arthritis Mother        857in 2022. shoulder, knee surgery.   Multiple myeloma Father        821in 2022. around mid 699s   Diverticulitis Father    Hearing loss Father        and poor vision   Hepatitis Father        B she believes   Healthy Sister    Healthy Sister    Arthritis Maternal Grandmother    Heart attack Maternal Grandmother        age 56  Arthritis Paternal Grandmother    Leukemia Maternal Uncle  Colon cancer Neg Hx    Colon polyps Neg Hx    Liver disease Neg Hx    Pancreatic cancer Neg Hx    Esophageal cancer Neg Hx    Stomach cancer Neg Hx    Rectal cancer Neg Hx     SH:  married, non smoker  Review of Systems  Gastrointestinal:  Positive for abdominal distention.   PHYSICAL EXAMINATION:    LMP 06/20/2017 (Approximate)     General appearance: alert, cooperative and appears stated age  Assessment/Plan: 21. Dermoid cyst of right ovary - will ask ultrasonographer to compare prior images for exact measurements to assess for any change.  If enlarged, will plan to remove.  2. Abdominal bloating - CA 125 will also be obtained  Total time with pt:  21 minutes

## 2020-11-20 ENCOUNTER — Other Ambulatory Visit (HOSPITAL_BASED_OUTPATIENT_CLINIC_OR_DEPARTMENT_OTHER): Payer: 59

## 2020-11-20 ENCOUNTER — Ambulatory Visit (HOSPITAL_BASED_OUTPATIENT_CLINIC_OR_DEPARTMENT_OTHER): Payer: 59 | Admitting: Obstetrics & Gynecology

## 2021-01-07 ENCOUNTER — Ambulatory Visit: Payer: 59 | Admitting: Family Medicine

## 2021-01-07 ENCOUNTER — Encounter: Payer: Self-pay | Admitting: Family Medicine

## 2021-01-07 VITALS — BP 122/74 | HR 72 | Temp 97.9°F | Ht 64.0 in | Wt 129.4 lb

## 2021-01-07 DIAGNOSIS — J069 Acute upper respiratory infection, unspecified: Secondary | ICD-10-CM | POA: Diagnosis not present

## 2021-01-07 DIAGNOSIS — J029 Acute pharyngitis, unspecified: Secondary | ICD-10-CM | POA: Diagnosis not present

## 2021-01-07 LAB — POCT RAPID STREP A (OFFICE): Rapid Strep A Screen: NEGATIVE

## 2021-01-07 MED ORDER — BENZONATATE 100 MG PO CAPS: 100.0000 mg | ORAL_CAPSULE | Freq: Three times a day (TID) | ORAL | 0 refills | Status: DC | PRN

## 2021-01-07 NOTE — Progress Notes (Signed)
Subjective:     Patient ID: Kimberly Stevens, female    DOB: 01/15/1966, 55 y.o.   MRN: [id number]  Chief Complaint  Patient presents with   Sore Throat    Started yesterday   Ear Pain    Right ear pain    Cough    Green phlegm    Fatigue    In bed for 3 days     HPI Started on 12/18, but mild cough for 1 wk. Productive cough.more when lying down and more from post nasal drip-saline helps  Throat hurts esp on R and R ear.  Concerned w/strep.  No f/c/myalgias/v/d.  Tested yesterday neg for covid.  Health Maintenance Due  Topic Date Due   Pneumococcal Vaccine 53-42 Years old (1 - PCV) Never done   INFLUENZA VACCINE  08/19/2020   PAP SMEAR-Modifier  10/08/2020    Past Medical History:  Diagnosis Date   Allergy    seasonal allergies   Arthritis    left great toe joint   Broken arm    right elbow & wrist   Colon polyps    benign   Depression    hx of post partum   Dermoid cyst of ovary    Eye disorder    possible glaucoma-being followed by Dr Tyrone Schimke   Fibrocystic breast    IBS (irritable bowel syndrome)    Plantar fasciitis    left foot 2/15-11/15   Polyp of duodenum 2014   tiny   Tendonitis    right knee 2/15-11/15   Torn rotator cuff    right    Past Surgical History:  Procedure Laterality Date   BROW LIFT Bilateral 02/22/2020   Procedure: BLEPHAROPLASTY UPPER EYELID; W/EXCESS SKIN BILATERAL;  Surgeon: Karle Starch, MD;  Location: [facility];  Service: Ophthalmology;  Laterality: Bilateral;   COLONOSCOPY  2014   JP-MAC-movi(exc)-poylpoid   POLYPECTOMY  2014   polypoid   WISDOM TOOTH EXTRACTION      Outpatient Medications Prior to Visit  Medication Sig Dispense Refill   nitrofurantoin (MACRODANTIN) 100 MG capsule Takes 1 tablet after intercourse 30 capsule 1   fluticasone (FLONASE) 50 MCG/ACT nasal spray Place 1 spray into both nostrils daily. (Patient not taking: Reported on 01/07/2021)     montelukast (SINGULAIR) 10 MG tablet Take 10 mg  by mouth at bedtime. (Patient not taking: Reported on 05/07/2020)     Nutritional Supplements (JUICE PLUS FIBRE) LIQD See admin instructions. (Patient not taking: Reported on 01/07/2021)     No facility-administered medications prior to visit.    Allergies  Allergen Reactions   Amoxicillin     REACTION: tremors Other reaction(s): Unknown   Erythromycin     Other reaction(s): Unknown   QIW:LNLGXQJJ/HERDEYCXKGYJEHU except as noted in HPI      Objective:     BP 122/74    Pulse 72    Temp 97.9 F (36.6 C) (Temporal)    Ht _0  (1.626 m)    Wt 129 lb 6 oz (58.7 kg)    LMP 06/20/2017 (Approximate)    SpO2 99%    BMI 22.21 kg/m  Wt Readings from Last 3 Encounters:  01/07/21 129 lb 6 oz (58.7 kg)  09/09/20 134 lb (60.8 kg)  05/23/20 131 lb (59.4 kg)        Gen: WDWN NAD WF HEENT: NCAT, conjunctiva not injected, sclera nonicteric TM WNL B, OP moist, no exudates . Congested mild NECK:  supple, no thyromegaly, no nodes,  no carotid bruits CARDIAC: RRR, S1S2+, no murmur. DP 2+B LUNGS: CTAB. No wheezes EXT:  no edema MSK: no gross abnormalities.  NEURO: A&O x3.  CN II-XII intact.  PSYCH: normal mood. Good eye contact  Results for orders placed or performed in visit on 01/07/21  POCT rapid strep A  Result Value Ref Range   Rapid Strep A Screen Negative Negative     Assessment & Plan:   Problem List Items Addressed This Visit   None Visit Diagnoses     Sore throat    -  Primary   Relevant Orders   POCT rapid strep A   Viral upper respiratory tract infection          Symptomatic tx.  Tessalon perles.  Worse/new symptoms-f/u or ER  No orders of the defined types were placed in this encounter.   Wellington Hampshire., MD

## 2021-01-07 NOTE — Patient Instructions (Signed)
Meds have been sent the the pharmacy You can take tylenol for pain/fevers If worsening symptoms, let us know or go to the Emergency room   Saline-1 cup water and 1/4 tsp salt.    Merry Christmas

## 2021-01-22 ENCOUNTER — Ambulatory Visit (INDEPENDENT_AMBULATORY_CARE_PROVIDER_SITE_OTHER): Payer: 59

## 2021-01-22 ENCOUNTER — Other Ambulatory Visit: Payer: Self-pay

## 2021-01-22 DIAGNOSIS — Z23 Encounter for immunization: Secondary | ICD-10-CM

## 2021-01-30 ENCOUNTER — Telehealth: Payer: Self-pay | Admitting: Family Medicine

## 2021-01-31 ENCOUNTER — Other Ambulatory Visit: Payer: Self-pay

## 2021-01-31 ENCOUNTER — Ambulatory Visit: Payer: 59 | Admitting: Family Medicine

## 2021-01-31 ENCOUNTER — Encounter: Payer: Self-pay | Admitting: Family Medicine

## 2021-01-31 VITALS — BP 122/80 | HR 70 | Temp 97.5°F | Ht 64.0 in | Wt 135.0 lb

## 2021-01-31 DIAGNOSIS — H9201 Otalgia, right ear: Secondary | ICD-10-CM | POA: Diagnosis not present

## 2021-01-31 MED ORDER — DOXYCYCLINE HYCLATE 100 MG PO TABS: 100.0000 mg | ORAL_TABLET | Freq: Two times a day (BID) | ORAL | 0 refills | Status: DC

## 2021-01-31 MED ORDER — PREDNISONE 20 MG PO TABS: 40.0000 mg | ORAL_TABLET | Freq: Every day | ORAL | 0 refills | Status: AC

## 2021-01-31 NOTE — Progress Notes (Signed)
Subjective:     Patient ID: Kimberly Stevens, female    DOB: Mar 04, 1965, 56 y.o.   MRN: [id number]  Chief Complaint  Patient presents with   Ear Pain    Right ear pain x 3 weeks     HPI Had URI/sore throat/R ear pain.  Has gotten better x R ear pain continues for 3 wks Deep in throat/inner ear.  Feels some better when hot water hits area Covid has been neg Nonsmoker.  No chewing tobacco  Health Maintenance Due  Topic Date Due   Pneumococcal Vaccine 70-42 Years old (1 - PCV) Never done   PAP SMEAR-Modifier  10/08/2020    Past Medical History:  Diagnosis Date   Allergy    seasonal allergies   Arthritis    left great toe joint   Broken arm    right elbow & wrist   Colon polyps    benign   Depression    hx of post partum   Dermoid cyst of ovary    Eye disorder    possible glaucoma-being followed by Dr Tyrone Schimke   Fibrocystic breast    IBS (irritable bowel syndrome)    Plantar fasciitis    left foot 2/15-11/15   Polyp of duodenum 2014   tiny   Tendonitis    right knee 2/15-11/15   Torn rotator cuff    right    Past Surgical History:  Procedure Laterality Date   BROW LIFT Bilateral 02/22/2020   Procedure: BLEPHAROPLASTY UPPER EYELID; W/EXCESS SKIN BILATERAL;  Surgeon: Karle Starch, MD;  Location: [facility];  Service: Ophthalmology;  Laterality: Bilateral;   COLONOSCOPY  2014   JP-MAC-movi(exc)-poylpoid   POLYPECTOMY  2014   polypoid   WISDOM TOOTH EXTRACTION      Outpatient Medications Prior to Visit  Medication Sig Dispense Refill   nitrofurantoin (MACRODANTIN) 100 MG capsule Takes 1 tablet after intercourse 30 capsule 1   Nutritional Supplements (JUICE PLUS FIBRE) LIQD See admin instructions.     fluticasone (FLONASE) 50 MCG/ACT nasal spray Place 1 spray into both nostrils daily. (Patient not taking: Reported on 01/07/2021)     benzonatate (TESSALON PERLES) 100 MG capsule Take 1 capsule (100 mg total) by mouth 3 (three) times daily as needed for  cough. (Patient not taking: Reported on 01/31/2021) 20 capsule 0   montelukast (SINGULAIR) 10 MG tablet Take 10 mg by mouth at bedtime. (Patient not taking: Reported on 05/07/2020)     No facility-administered medications prior to visit.    Allergies  Allergen Reactions   Amoxicillin     REACTION: tremors Other reaction(s): Unknown   Erythromycin     Other reaction(s): Unknown   LID:CVUDTHYH/[id number] except as noted in HPI      Objective:     BP 122/80    Pulse 70    Temp (!) 97.5 F (36.4 C) (Temporal)    Ht _0  (1.626 m)    Wt 135 lb (61.2 kg)    LMP 06/20/2017 (Approximate)    SpO2 97%    BMI 23.17 kg/m  Wt Readings from Last 3 Encounters:  01/31/21 135 lb (61.2 kg)  01/07/21 129 lb 6 oz (58.7 kg)  09/09/20 134 lb (60.8 kg)        Gen: WDWN NAD HEENT: NCAT, conjunctiva not injected, sclera nonicteric TM WNL B, OP moist, no exudates  NECK:  supple, no thyromegaly, tender spot R under jaw near back and ? Small lymph node, no carotid bruits  EXT:  no edema MSK: no gross abnormalities.  NEURO: A&O x3.  CN II-XII intact.  PSYCH: normal mood. Good eye contact  Assessment & Plan:   Problem List Items Addressed This Visit   None Visit Diagnoses     Ear pain, referred, right    -  Primary      Ear pain R-? Referred.  Throat pain as well.  Will do abx and pred.  If no change, ENT  Meds ordered this encounter  Medications   doxycycline (VIBRA-TABS) 100 MG tablet    Sig: Take 1 tablet (100 mg total) by mouth 2 (two) times daily.    Dispense:  14 tablet    Refill:  0   predniSONE (DELTASONE) 20 MG tablet    Sig: Take 2 tablets (40 mg total) by mouth daily with breakfast for 5 days.    Dispense:  10 tablet    Refill:  0    Wellington Hampshire, MD

## 2021-01-31 NOTE — Patient Instructions (Signed)
Meds have been sent the the pharmacy You can take tylenol for pain/fevers If worsening symptoms, let us know or go to the Emergency room   Will do antibiotics and prednisone.  If not better, let me know and refer to ENT

## 2021-02-03 NOTE — Telephone Encounter (Signed)
error 

## 2021-02-07 ENCOUNTER — Other Ambulatory Visit: Payer: Self-pay

## 2021-02-07 ENCOUNTER — Ambulatory Visit: Payer: 59 | Admitting: Family Medicine

## 2021-02-07 ENCOUNTER — Encounter: Payer: Self-pay | Admitting: Family Medicine

## 2021-02-07 ENCOUNTER — Ambulatory Visit (INDEPENDENT_AMBULATORY_CARE_PROVIDER_SITE_OTHER)
Admission: RE | Admit: 2021-02-07 | Discharge: 2021-02-07 | Disposition: A | Discharge status: Home / Self Care | Payer: 59 | Source: Ambulatory Visit | Attending: Family Medicine | Admitting: Family Medicine

## 2021-02-07 VITALS — BP 120/70 | HR 75 | Temp 96.1°F | Ht 64.0 in | Wt 132.4 lb

## 2021-02-07 DIAGNOSIS — R0781 Pleurodynia: Secondary | ICD-10-CM

## 2021-02-07 DIAGNOSIS — E782 Mixed hyperlipidemia: Secondary | ICD-10-CM

## 2021-02-07 LAB — COMPREHENSIVE METABOLIC PANEL
ALT: 19 U/L (ref 0–35)
AST: 20 U/L (ref 0–37)
Albumin: 4.6 g/dL (ref 3.5–5.2)
Alkaline Phosphatase: 60 U/L (ref 39–117)
BUN: 18 mg/dL (ref 6–23)
CO2: 30 mEq/L (ref 19–32)
Calcium: 9.5 mg/dL (ref 8.4–10.5)
Chloride: 101 mEq/L (ref 96–112)
Creatinine, Ser: 0.98 mg/dL (ref 0.40–1.20)
GFR: 64.54 mL/min (ref >60.00)
Glucose, Bld: 93 mg/dL (ref 70–99)
Potassium: 4.9 mEq/L (ref 3.5–5.1)
Sodium: 137 mEq/L (ref 135–145)
Total Bilirubin: 0.5 mg/dL (ref 0.2–1.2)
Total Protein: 7.1 g/dL (ref 6.0–8.3)

## 2021-02-07 LAB — CBC WITH DIFFERENTIAL/PLATELET
Basophils Absolute: 0.1 10*3/uL (ref 0.0–0.1)
Basophils Relative: 0.9 % (ref 0.0–3.0)
Eosinophils Absolute: 0.1 10*3/uL (ref 0.0–0.7)
Eosinophils Relative: 1.5 % (ref 0.0–5.0)
HCT: 45.8 % (ref 36.0–46.0)
Hemoglobin: 15.4 g/dL — ABNORMAL HIGH (ref 12.0–15.0)
Lymphocytes Relative: 24.6 % (ref 12.0–46.0)
Lymphs Abs: 1.9 10*3/uL (ref 0.7–4.0)
MCHC: 33.7 g/dL (ref 30.0–36.0)
MCV: 97.9 fl (ref 78.0–100.0)
Monocytes Absolute: 0.5 10*3/uL (ref 0.1–1.0)
Monocytes Relative: 7.2 % (ref 3.0–12.0)
Neutro Abs: 5 10*3/uL (ref 1.4–7.7)
Neutrophils Relative %: 65.8 % (ref 43.0–77.0)
Platelets: 249 10*3/uL (ref 150.0–400.0)
RBC: 4.68 Mil/uL (ref 3.87–5.11)
RDW: 12.8 % (ref 11.5–15.5)
WBC: 7.7 10*3/uL (ref 4.0–10.5)

## 2021-02-07 NOTE — Progress Notes (Signed)
Phone [phone] In person visit   Subjective:   Kimberly Stevens is a 56 y.o. year old very pleasant female patient who presents for/with See problem oriented charting Chief Complaint  Patient presents with   rib pain right side    Pt c/o rib pain that's been hurting for a week, she wants to make sure nothing is wrong with her spleen or liver and has muscle pain under her heart.    This visit occurred during the SARS-CoV-2 public health emergency.  Safety protocols were in place, including screening questions prior to the visit, additional usage of staff PPE, and extensive cleaning of exam room while observing appropriate contact time as indicated for disinfecting solutions.   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     nitrofurantoin (MACRODANTIN) 100 MG capsule Takes 1 tablet after intercourse (Patient not taking: Reported on 02/07/2021) 30 capsule 1   No current facility-administered medications for this visit.     Objective:  BP 120/70    Pulse 75    Temp (!) 96.1 F (35.6 C)    Ht 5\' 4"  (1.626 m)    Wt 132 lb 6.4 oz (60.1 kg)    LMP 06/20/2017 (Approximate)    SpO2 97%    BMI 22.73 kg/m  Gen: NAD, resting comfortably CV: RRR no murmurs rubs or gallops Lungs: CTAB no crackles, wheeze,  rhonchi.  Pinpoint pain over right lateral rib Left upper chest- discomfort with palpation today Abdomen: soft/nontender- other than some referred pain with RUQ palpation out to the lateral rib/nondistended/normal bowel sounds. No rebound or guarding.  Ext: no edema Skin: warm, dry    Assessment and Plan   # Right side rib pain  S: Patient with upper respiratory infection in late December.  Initially was doing better from this but had persistent right ear pain-treated with doxycycline due to amoxicillin allergy and prednisone starting on January 31, 2021. She is doing much better with this  Starting last Thursday. No fall or injury but wonders if triggered this with twisting to put on seatbelt in car- started with twinge right after that.  At complete rest no pain. Tender with palpation at all times or if lying on it or if twisting certain way (like putting belt or turning to look at traffic) or if taking very deep breath (but improving on this only- Sunday could not take a deep breath but now can). Played pickleball on Sunday but pain was worse. Not short of breath just avoids breathing deeply. On other hand can still do peloton. No bruising. No fever or chills or nausea. No pain with eating.   Also in warrior one pose with yoga 7 weeks ago thinks she tweaked her left upper chest- only  bothers in very specific positions. This does not bother her with peloton or walking- only certain motions such as pushing head forward against hands. Can do hand weights no issues. No shortness of breath with this. No left arm or neck pain. With heavy stress notes some soreness in left upper chest as well- not sharp like the other pain. Wonders if stress contributes. Daughter getting married in 2 weeks- getting her prepped in [place].   Persistent diarrhea issues- she thinks gluten related (worse with pizza for instance). Cutting gluten helps. Also has to do Low fodmap generally. She cut out dairy in addition to  gluten. [place] was reassuring in pelvis back in October.   Was worried about appendix/spleen.  No pain in LLQ and no splenomegaly  A/P: right sided rib pain for 1 week after turning awkwardly in car- pain is pinpoint over the ribs- will get rib films to rule out bone abnormality. Exam reassuring other than this area of pinpoint pain. Could be muscular- try heat 15-20 minutes 3x a day. No evidence that voltaren gel would help but reasonable to try or if she wants to do some short term oral nsaids like ibuprofen or aleve  Also with some left upper chest pain- she is going to call sports medicine as this is specific to a certain movement- there is likely some rehab she can do for this I would suspect but I am not sure which specific exercises to give.   Also having some stress related chest achiness- with known hyperlipidemia we would not want to miss something here- LDL goal would be lower if does have atherosclerosis as well so we opted to get ct cardiac scoring for more info. She exercises without difficulty so stress test unlikely helpful. Offered ekg- declines for now. 10 year risk still rather low only under 2%.    Recommended follow up: Return for next already scheduled visit. Future Appointments  Date Time Provider [place]  09/11/2021  8:20 AM Marin Olp, MD [facility]   Lab/Order associations:   ICD-10-CM   1. Rib pain on right side  R07.81 DG Ribs Unilateral W/Chest Right    CBC with Differential/Platelet    Comprehensive metabolic panel    2. Mixed hyperlipidemia  E78.2 CT CARDIAC SCORING (SELF PAY ONLY)    CBC with Differential/Platelet    Comprehensive metabolic panel     Time Spent: 31 minutes of total time (1:07 PM- 1:38 PM) was spent on the date of the encounter performing the following actions: chart review prior to seeing the patient, obtaining history, performing a medically necessary exam, counseling on the treatment plan, placing orders, and documenting in  our EHR.   I,Jada Bradford,acting as a scribe for Garret Reddish, MD.,have documented all relevant documentation on the behalf of Garret Reddish, MD,as directed by  Garret Reddish, MD while in the presence of Garret Reddish, MD.  I, Garret Reddish, MD, have reviewed all documentation for this visit. The documentation on 02/07/21 for the exam, diagnosis, procedures, and orders are all accurate and complete.  Return precautions advised.  Garret Reddish, MD

## 2021-02-07 NOTE — Patient Instructions (Addendum)
Please try using a heat pad for 15-20 minutes, 3 times a day for your right rib pain  We will call you within two weeks about your referral to Cardiac CT Scoring. If you do not hear within 2 weeks, give [place] a call.   Please stop by lab before you go If you have mychart- we will send your results within 3 business days of [place] receiving them.  If you do not have mychart- we will call you about results within 5 business days of [place] receiving them.  *please also note that you will see labs on mychart as soon as they post. I will later go in and write notes on them- will say "notes from Dr. Yong Channel"  Please go to [facility] (updated 03/16/2019) - located [address] across the street from [place] - in the basement - Hours: 8:30-5:00 PM M-F (with lunch from 12:30- 1 PM). You do NOT need an appointment.   Recommended follow up: Return for next already scheduled visit.

## 2021-03-20 ENCOUNTER — Other Ambulatory Visit: Payer: Self-pay

## 2021-03-20 ENCOUNTER — Ambulatory Visit (INDEPENDENT_AMBULATORY_CARE_PROVIDER_SITE_OTHER)
Admission: RE | Admit: 2021-03-20 | Discharge: 2021-03-20 | Disposition: A | Discharge status: Home / Self Care | Payer: Self-pay | Source: Ambulatory Visit | Attending: Family Medicine | Admitting: Family Medicine

## 2021-03-20 ENCOUNTER — Ambulatory Visit (INDEPENDENT_AMBULATORY_CARE_PROVIDER_SITE_OTHER): Payer: 59 | Admitting: Sports Medicine

## 2021-03-20 ENCOUNTER — Ambulatory Visit: Payer: Self-pay

## 2021-03-20 VITALS — BP 112/60 | Ht 64.0 in | Wt 131.0 lb

## 2021-03-20 DIAGNOSIS — S29011A Strain of muscle and tendon of front wall of thorax, initial encounter: Secondary | ICD-10-CM

## 2021-03-20 DIAGNOSIS — E782 Mixed hyperlipidemia: Secondary | ICD-10-CM

## 2021-03-20 DIAGNOSIS — M940 Chondrocostal junction syndrome [Tietze]: Secondary | ICD-10-CM | POA: Diagnosis not present

## 2021-03-20 NOTE — Progress Notes (Signed)
? ?  Kimberly Stevens is a 56 y.o. female who presents to [facility] today for the following: ? ?Left Chest Wall Pain ?Started in October ?Thinks that she may have done some butterflie swim strokes when it first started ?No other particular injury ?Doesn't hurt all the time ?Worst when she was adducting her right arm to vacuum the car ?No pain with upper body light weight exercises  ?Can also do push ups without pain ?Can feel pain occasionally when vigorously washing her hair ?Always just lateral to her sternum on the left in the same spot ?Feels tender to touch, but difficult to find a way to reproduce her own pain ? ?PMH reviewed.  ?ROS as above. ?Medications reviewed. ? ?Exam:  ?BP 112/60   Ht 5\' 4"  (1.626 m)   Wt 131 lb (59.4 kg)   LMP 06/20/2017 (Approximate)   BMI 22.49 kg/m?  ?Gen: Well NAD ?MSK: ? ?Left Chest Wall  ?Inspection: No obvious abnormality on visualization, no erythema or ecchymosis ?Palpation: Point TTP at costochondral junction at left rib 5, none at surrounding interspaces or throughout the pec major ?ROM: Full ROM of BUE without pain ?Strength: 5/5 strength BUE, no pain with strength testing of pec major/minor ?Neurovascular: NV intact distally b/l ?Special Tests: Pain with adduction of right arm and left arm at 5th costochondral junction ? ?Limited US of Left Chest Wall ?Sonopalpation reveals the point of maximal tenderness to be the 5th costochondral joint.  This was evaluated in both transverse and longitudinal axis with noted 2 hyperechoic calcifications within the joint and a hypoechoic effusion of the joint.  There was negative overlying doppler flow. ? ?Impression: strain of 5th costochondral joint ? ?Ultrasound and interpretation by Wolfgang Phoenix. Oneida Alar, MD and Arizona Constable, DO ? ? ? ?Assessment and Plan: ?1) Costochondritis ?Exam and US findings most consistent with an injury to the left 5th costochondral joint that is not showing any signs of healing on [place] with negative doppler flow.   Notable calcifications and effusion in the joint.  Can trial nitro patches as she has had benefit from them in the past.  Can also do ice massages.  Avoid over stretching and stressing the joint, specifically with breast stroke/butterfly.  F/u prn. ? ? ?Arizona Constable, D.O.  ?PGY-4 [facility]  ?03/20/2021 5:24 PM ? ?I observed and examined the patient with the [facility] resident and agree with assessment and plan.  Note reviewed and modified by me.  ? ?Ila Mcgill, MD ?

## 2021-03-20 NOTE — Patient Instructions (Signed)
Thank you for coming to see me today. It was a pleasure. Today we talked about:  ? ?You have a small calcification and fluid within the joint between your breast bone and cartilage.   ? ?To improve this, you can try a nitro patch on the area daily.  You can continue to do arm exercises, but avoid reaching backwards and stretching your chest to let this heal. ? ?Using an ice massage daily can also be helpful. ? ?If you are going to swim, avoid the breast stroke or butterfly. ? ?Please follow-up with [place] as needed. ? ?If you have any questions or concerns, please do not hesitate to call the office at [phone]. ? ?Best,  ? ?Arizona Constable, DO ?[place]  ?

## 2021-03-20 NOTE — Assessment & Plan Note (Signed)
Exam and US findings most consistent with an injury to the left 5th costochondral joint that is not showing any signs of healing on [place] with negative doppler flow.  Notable calcifications and effusion in the joint.  Can trial nitro patches as she has had benefit from them in the past.  Can also do ice massages.  Avoid over stretching and stressing the joint, specifically with breast stroke/butterfly.  F/u prn. ?

## 2021-05-19 ENCOUNTER — Ambulatory Visit: Payer: Self-pay | Admitting: Family Medicine

## 2021-05-19 ENCOUNTER — Encounter: Payer: Self-pay | Admitting: Family Medicine

## 2021-05-19 ENCOUNTER — Ambulatory Visit (INDEPENDENT_AMBULATORY_CARE_PROVIDER_SITE_OTHER): Payer: 59 | Admitting: Family Medicine

## 2021-05-19 VITALS — BP 118/76 | HR 68 | Temp 97.3°F | Ht 64.0 in | Wt 135.6 lb

## 2021-05-19 DIAGNOSIS — M79676 Pain in unspecified toe(s): Secondary | ICD-10-CM

## 2021-05-19 MED ORDER — CELECOXIB 200 MG PO CAPS: 200.0000 mg | ORAL_CAPSULE | Freq: Two times a day (BID) | ORAL | 5 refills | Status: DC | PRN

## 2021-05-19 NOTE — Progress Notes (Signed)
? ?  Kimberly Stevens is a 56 y.o. female who presents today for an office visit. ? ?Assessment/Plan:  ?Chronic Problems Addressed Today: ?Bilateral Toe Stevens ?Likely underlying osteoarthritis though we will check uric acid level and rheumatoid factor to rule out any other possible causes.  Her Stevens is fairly debilitating at this point and limits her activities of daily living.  We will place referral for her to see rheumatology with Dr. Amil Amen per patient request.  Her husband sees him as well.  We will start Celebrex 20 mg twice daily as needed. ? ?  ?Subjective:  ?HPI: ? ?Patient here with Stevens in her bilateral feet for several years. She has seen several physicians in the past including orthopedics and sports medicine who diagnosed her with arthritis. Stevens has been worsening in her right big toe last few months.  She has had x-rays and ultrasound the past which showed arthritis.  She is worried about inflammatory condition such as gout or rheumatoid arthritis.  Her husband sees a rheumatologist and she would like a referral to see him as well. ? ?   ?  ?Objective:  ?Physical Exam: ?BP 118/76 (BP Location: Left Arm)   Pulse 68   Temp (!) 97.3 ?F (36.3 ?C) (Temporal)   Ht '5\' 4"'$  (1.626 m)   Wt 135 lb 9.6 oz (61.5 kg)   LMP 06/20/2017 (Approximate)   SpO2 100%   BMI 23.28 kg/m?   ?Gen: No acute distress, resting comfortably ?MSK: ?-Feet: Hallux rigidus noted bilaterally. ?Neuro: Grossly normal, moves all extremities ?Psych: Normal affect and thought content ? ?   ? ?Kimberly Stevens. Kimberly Pain, MD ?05/19/2021 2:58 PM  ?

## 2021-05-19 NOTE — Patient Instructions (Addendum)
It was very nice to see you today! ? ?We will check blood work today. ? ?I will refer you to see a rheumatologist . ? ?Please take the Celebrex as needed. ? ?Take care, ?Dr Jerline Pain ? ?PLEASE NOTE: ? ?If you had any lab tests please let us know if you have not heard back within a few days. You may see your results on mychart before we have a chance to review them but we will give you a call once they are reviewed by [place]. If we ordered any referrals today, please let us know if you have not heard from their office within the next week.  ? ?Please try these tips to maintain a healthy lifestyle: ? ?Eat at least 3 REAL meals and 1-2 snacks per day.  Aim for no more than 5 hours between eating.  If you eat breakfast, please do so within one hour of getting up.  ? ?Each meal should contain half fruits/vegetables, one quarter protein, and one quarter carbs (no bigger than a computer mouse) ? ?Cut down on sweet beverages. This includes juice, soda, and sweet tea.  ? ?Drink at least 1 glass of water with each meal and aim for at least 8 glasses per day ? ?Exercise at least 150 minutes every week.   ?

## 2021-05-20 LAB — URIC ACID: Uric Acid, Serum: 3.9 mg/dL (ref 2.4–7.0)

## 2021-05-20 LAB — RHEUMATOID FACTOR: Rheumatoid fact SerPl-aCnc: <14 IU/mL (ref <14)

## 2021-05-20 NOTE — Progress Notes (Signed)
Please inform patient of the following: ? ?Her uric acid and rheumatoid factor levels are NORMAL.  Do not need to do any further testing at this time.  She should follow-up with her PCP if symptoms or not improving.

## 2021-06-30 ENCOUNTER — Telehealth: Payer: Self-pay | Admitting: Family Medicine

## 2021-06-30 NOTE — Telephone Encounter (Signed)
Called and spoke with pt and made aware she is good on her Tdap until 2024.

## 2021-06-30 NOTE — Telephone Encounter (Signed)
Pt states she will be traveling for a mission trip in July 2023 and it was recommended she "get a tetanus shot".  Pt chart shows Tdap is due 2024.   Please advise.    Please route to [place] for any scheduling needs.

## 2021-07-15 ENCOUNTER — Ambulatory Visit (INDEPENDENT_AMBULATORY_CARE_PROVIDER_SITE_OTHER): Payer: 59 | Admitting: Sports Medicine

## 2021-07-15 VITALS — BP 116/68 | Ht 64.0 in | Wt 132.0 lb

## 2021-07-15 DIAGNOSIS — M2022 Hallux rigidus, left foot: Secondary | ICD-10-CM

## 2021-07-15 DIAGNOSIS — M2021 Hallux rigidus, right foot: Secondary | ICD-10-CM | POA: Diagnosis not present

## 2021-07-15 NOTE — Progress Notes (Signed)
    SUBJECTIVE:   CHIEF COMPLAINT / HPI:   Great toe pain Patient presenting for evaluation of great toe pain and both feet.  Worse on the right than the left.  She is seen 2 separate orthopedists who recommended surgery.  The discussion was for fusion of the first digit.  She did discuss with them replacement of the joint but they opted that that probably is not the best option.  Pain in the right great toe was considerable and she was scheduling the surgery for this but decided to tried dry needling.  Reports great improvement after the dry needling although that the procedure was very painful.  She is not having any pain in her left foot.  Patient presents with xrays of both feet showing  stage I hallux rigidus on right foot and stage III hallux rigidus on left foot with hallux interphalangeus as well as elevated first ray.  OBJECTIVE:   BP 116/68 (BP Location: Left Arm, Patient Position: Sitting, Cuff Size: Normal)   Ht 5\' 4"  (1.626 m)   Wt 132 lb (59.9 kg)   LMP 06/20/2017 (Approximate)   BMI 22.66 kg/m   General: Pleasant 56 year old female Right Foot: No visible erythema, swelling, ecchymosis Transverse arch grossly intact; Range of motion at the ankle is full in all directions. Strength is 5/5 in all directions.  Hallux rigidus appreciated and great toe with 20 degrees of flexion and 10 degrees of extension. XR - significant loss of joint space  Left  Foot: No visible erythema, swelling, ecchymosis Transverse arch grossly intact; Range of motion at the ankle is full in all directions. Strength is 5/5 in all directions.  Hallux rigidus appreciated and great toe with 20 degrees of flexion and 0 degrees of extension. XR - total loss of Joint space  ASSESSMENT/PLAN:   Hallux rigidus of both feet Patient with hallux rigidus in both feet worse in the left than the right.  Pain is worse on the right.  Patient saw great improvement with dry needling.  She has met with multiple orthopedist  recommending fusion of the joint.  This is an option but patient's main goal is to relieve the pain and she is seeing great pain improvement with the dry needling.  Discussed continue use of over-the-counter analgesics such as Tylenol or ibuprofen, continue dry needling and if pain continues to be an issue then feel surgery is a reasonable option.  Return precautions given.  Follow-up as needed.     Celedonio Savage, MD [facility]   I observed and examined the patient with the resident and agree with assessment and plan.  Note reviewed and modified by me. Sterling Big, MD

## 2021-07-15 NOTE — Assessment & Plan Note (Signed)
Patient with hallux rigidus in both feet worse in the left than the right.  Pain is worse on the right.  Patient saw great improvement with dry needling.  She has met with multiple orthopedist recommending fusion of the joint.  This is an option but patient's main goal is to relieve the pain and she is seeing great pain improvement with the dry needling.  Discussed continue use of over-the-counter analgesics such as Tylenol or ibuprofen, continue dry needling and if pain continues to be an issue then feel surgery is a reasonable option.  Return precautions given.  Follow-up as needed.

## 2021-07-30 ENCOUNTER — Telehealth: Payer: Self-pay | Admitting: Family Medicine

## 2021-07-30 NOTE — Telephone Encounter (Signed)
Caller states patient is having "pain on the left side of the chest" / "at best it is a torn muscle".  Pt is traveling and not able to make phone calls, communicating via text. Will return to [place], [place] on Saturday night (07/15).  Caller wanted to schedule OV with provider at [facility]. FO Rep encouraged use of on-call nurse number.  FO Rep provided caller (via MyChart message) on call/PCP triage nurse direct line but also can be reach by calling the PCP office after hours.

## 2021-07-31 NOTE — Telephone Encounter (Signed)
FYI

## 2021-07-31 NOTE — Telephone Encounter (Signed)
This is concerning- chest pain that "at best it is a torn muscle". Please get her a message (maybe mychart) to try to connect with triage- I am out of office tomorrow. She may need to be seen where she is

## 2021-08-01 NOTE — Telephone Encounter (Signed)
Patient traveling (airplane) - husband haves information and instructions needed to follow up with triage nurse and how to reach out to them. Husband agreed.

## 2021-08-01 NOTE — Telephone Encounter (Signed)
Can you connect pt with triage for the below?

## 2021-08-04 ENCOUNTER — Telehealth: Payer: Self-pay | Admitting: Family Medicine

## 2021-08-04 ENCOUNTER — Other Ambulatory Visit: Payer: Self-pay

## 2021-08-04 ENCOUNTER — Ambulatory Visit: Payer: 59 | Admitting: Family Medicine

## 2021-08-04 ENCOUNTER — Emergency Department (HOSPITAL_COMMUNITY): Admission: EM | Admit: 2021-08-04 | Discharge: 2021-08-04 | Discharge status: Against Medical Advice | Payer: 59

## 2021-08-04 NOTE — Telephone Encounter (Signed)
Patient at ED now - will cancel todays apt -   Patient Name: Kimberly Stevens [facility] Gender: Female DOB: May 19, 1965 Age: 56 Y 11 M 18 D Return Phone Number: [phone] (Primary) Address: City/ State/ Zip: [place]  [address] Client [place] [facility] [place] Client Site [place] [facility] Day Provider Garret Reddish- MD Contact Type Call Who Is Calling Patient / Member / Family / Caregiver Call Type Triage / Clinical Relationship To Patient Self Return Phone Number [phone] (Primary) Chief Complaint CHEST PAIN - pain, pressure, heaviness or tightness Reason for Call Symptomatic / Request for [name] states she has been having chest pain. Translation No Nurse Assessment Nurse: Cristino Martes, RN, Joelene Millin Date/Time (Eastern Time): 08/04/2021 1:42:37 PM Confirm and document reason for call. If symptomatic, describe symptoms. ---Caller states she has been having chest pain. Drank some coffee. [place]. [place] , heart racing and pressure to chest Does the patient have any new or worsening symptoms? ---Yes Will a triage be completed? ---Yes Related visit to physician within the last 2 weeks? ---No Does the PT have any chronic conditions? (i.e. diabetes, asthma, this includes High risk factors for pregnancy, etc.) ---Unknown Is this a behavioral health or substance abuse call? ---No Guidelines Guideline Title Affirmed Question Affirmed Notes Nurse Date/Time Eilene Ghazi Time) Chest Pain [1] Chest pain lasts > 5 minutes AND [2] age > 47 Coronado, RN, Joelene Millin 08/04/2021 1:44:41 PM Disp. Time Eilene Ghazi Time) Disposition Final User 08/04/2021 1:40:24 PM Send to Urgent Queue Sinclair Grooms 08/04/2021 1:45:26 PM Call EMS 911 Now Yes Stripling, RN, Joelene Millin PLEASE NOTE: All timestamps contained within this report are represented as [place] Standard Time. CONFIDENTIALTY  NOTICE: This fax transmission is intended only for the addressee. It contains information that is legally privileged, confidential or otherwise protected from use or disclosure. If you are not the intended recipient, you are strictly prohibited from reviewing, disclosing, copying using or disseminating any of this information or taking any action in reliance on or regarding this information. If you have received this fax in error, please notify us immediately by telephone so that we can arrange for its return to [place]. Phone: [phone], Toll-Free: [phone], Fax: [phone] Page: 2 of 2 Call Id: [id number] Final Disposition 08/04/2021 1:45:26 PM Call EMS 911 Now Yes Cristino Martes, RN, Renea Ee Disagree/Comply Comply Caller Understands Yes PreDisposition InappropriateToAsk Care Advice Given Per Guideline CALL EMS 911 NOW: * Immediate medical attention is needed. You need to hang up and call 911 (or an ambulance). * Triager Discretion: I'll call you back in a few minutes to be sure you were able to reach them. CARE ADVICE given per Chest Pain (Adult) guideline. Comments User: Jim Desanctis, RN Date/Time Eilene Ghazi Time): 08/04/2021 1:46:46 PM Pt was in the parking lot and so she was going to walk in. Quick assessment to make sure that earlier assessment was still appropriate to current symptoms. Referrals [place]

## 2021-08-04 NOTE — Telephone Encounter (Signed)
Pt states: -dealing with chest pain since august 2022 -on the right side, doesn't ache all the time. -wrenching pain when body does a specific motion / left hand to reach to the floor. -Heard a popping sound when out of town. -Experiencing tingling sensation since popping sound. -gut says a pulled muscle -Not short of breath. -Exercised this morning.   Pt warm transferred to Suanne Marker, patient coordinator with PCP Triage Nurse.  Awaiting follow up notes.

## 2021-08-04 NOTE — Telephone Encounter (Signed)
Jinny Blossom, RN called back following pt triage and stated that she did recommend patient go to ED for further evaluation but patient refused. Upon further discussion Jinny Blossom stated that although the pt qualifies for ED evaluation due to recent travel to a third world country, she believes patient can be seen in office today. I was able to schedule patient with Dr. Yong Channel 07/17 @ 2pm. Please advise on any further action necessary.

## 2021-08-04 NOTE — Telephone Encounter (Signed)
Patient called stating she haves chest pain -has a 2pm apt with [facility] - patient unsure of going to ED - patient was triaged and sent to speak to nurse - awaiting notes

## 2021-08-04 NOTE — Telephone Encounter (Signed)
See below

## 2021-08-04 NOTE — Telephone Encounter (Signed)
Please advise for next steps.

## 2021-08-04 NOTE — ED Notes (Signed)
Patient decided to leave stated that whatever was wrong it has got better

## 2021-08-04 NOTE — Telephone Encounter (Signed)
Please schedule OV.  

## 2021-08-04 NOTE — Telephone Encounter (Signed)
Visit today to discuss further since declined going to facility

## 2021-08-04 NOTE — Telephone Encounter (Signed)
Patient Name: Kimberly Stevens [facility] Gender: Female DOB: 12-09-1965 Age: 56 Y 11 M 18 D Return Phone Number: [phone] (Primary) Address: City/ State/ Zip: [place]  [address] Client [place] [facility] [place] Client Site [place] [facility] [place] Day Provider Garret Reddish- MD Contact Type Call Who Is Calling Patient / Member / Family / Caregiver Call Type Triage / Clinical Relationship To Patient Self Return Phone Number [phone] (Primary) Chief Complaint CHEST PAIN - pain, pressure, heaviness or tightness Reason for Call Symptomatic / Request for [place] states chest pain RT side-not constant; tingles; cannot reach floor w/LT hand w/o pain; not SOB; heard something pop when out of town last week; Translation No Nurse Assessment Nurse: Susy Manor, RN, Megan Date/Time (Eastern Time): 08/04/2021 10:07:30 AM Confirm and document reason for call. If symptomatic, describe symptoms. ---She is having chest pain when she bends down and picks something up with her left arm. She will then have tingling in her arm. She was hiking, took a break, and when getting up, she pushed off and felt something pop. This was a week ago yesterday. Denies SOB. She was able to work out this morning. Does the patient have any new or worsening symptoms? ---Yes Will a triage be completed? ---Yes Related visit to physician within the last 2 weeks? ---No Does the PT have any chronic conditions? (i.e. diabetes, asthma, this includes High risk factors for pregnancy, etc.) ---No Is this a behavioral health or substance abuse call? ---No Guidelines Guideline Title Affirmed Question Affirmed Notes Nurse Date/Time (Eastern Time) Chest Pain Long-distance travel in past month (e.g., car, bus, train, plane; Susy Manor, RN, Megan 08/04/2021 10:12:02 AM PLEASE NOTE: All timestamps contained within this report are represented as  [place] Standard Time. CONFIDENTIALTY NOTICE: This fax transmission is intended only for the addressee. It contains information that is legally privileged, confidential or otherwise protected from use or disclosure. If you are not the intended recipient, you are strictly prohibited from reviewing, disclosing, copying using or disseminating any of this information or taking any action in reliance on or regarding this information. If you have received this fax in error, please notify us immediately by telephone so that we can arrange for its return to [place]. Phone: [phone], Toll-Free: [phone], Fax: [phone] Page: 2 of 2 Call Id: [id number] Guidelines Guideline Title Affirmed Question Affirmed Notes Nurse Date/Time Eilene Ghazi Time) with trip lasting 6 or more hours) Disp. Time Eilene Ghazi Time) Disposition Final User 08/04/2021 10:06:35 AM Send to Urgent Queue Jerrye Beavers 08/04/2021 10:16:42 AM Go to ED Now Yes Susy Manor, RN, Megan Final Disposition 08/04/2021 10:16:42 AM Go to ED Now Yes Susy Manor, RN, Delphia Grates Disagree/Comply Comply Caller Understands Yes PreDisposition Call Doctor Care Advice Given Per Guideline GO TO ED NOW: * You need to be seen in the Emergency Department. * Go to the ED at ___________ [place] given per Chest Pain (Adult) guideline. Comments User: Sula Soda, RN Date/Time Eilene Ghazi Time): 08/04/2021 10:20:39 AM I called office. Office staff stated they will call pt back to set up an appointment. Caller verbalized understanding. Referrals GO TO FACILITY REFUSED REFERRED TO PCP OFFICE

## 2021-08-05 ENCOUNTER — Ambulatory Visit (INDEPENDENT_AMBULATORY_CARE_PROVIDER_SITE_OTHER): Payer: 59 | Admitting: Family Medicine

## 2021-08-05 ENCOUNTER — Ambulatory Visit (INDEPENDENT_AMBULATORY_CARE_PROVIDER_SITE_OTHER)
Admission: RE | Admit: 2021-08-05 | Discharge: 2021-08-05 | Disposition: A | Discharge status: Home / Self Care | Payer: 59 | Source: Ambulatory Visit | Attending: Family Medicine | Admitting: Family Medicine

## 2021-08-05 ENCOUNTER — Encounter: Payer: Self-pay | Admitting: Family Medicine

## 2021-08-05 VITALS — BP 110/80 | HR 59 | Temp 98.2°F | Ht 64.0 in | Wt 132.2 lb

## 2021-08-05 DIAGNOSIS — R079 Chest pain, unspecified: Secondary | ICD-10-CM

## 2021-08-05 NOTE — Progress Notes (Signed)
Phone [phone] In person visit   Subjective:   Kimberly Stevens is a 56 y.o. year old very pleasant female patient who presents for/with See problem oriented charting Chief Complaint  Patient presents with   Chest Pain    Pt stated that she has some muscle pain that has been going on for the past week. 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     montelukast (SINGULAIR) 10 MG tablet Take 10 mg by mouth at bedtime.     nitrofurantoin (MACRODANTIN) 100 MG capsule Takes 1 tablet after intercourse 30 capsule 1   celecoxib (CELEBREX) 200 MG capsule Take 1 capsule (200 mg total) by mouth 2 (two) times daily as needed. (Patient not taking: Reported on 08/05/2021) 60 capsule 5   Nutritional Supplements (JUICE PLUS FIBRE) LIQD See admin instructions. (Patient not taking: Reported on 08/05/2021)     No current facility-administered medications for this visit.     Objective:  BP 110/80   Pulse (!) 59   Temp 98.2 F (36.8 C)   Ht '5\' 4"'$  (1.626 m)   Wt 132 lb 3.2 oz (60 kg)   LMP 06/20/2017 (Approximate)   SpO2 99%   BMI 22.69 kg/m  Gen: NAD, resting comfortably CV: RRR no murmurs rubs or  gallops Significant left lower chest wall pain with palpation at junction between rib and sternum but does not report as severe as when she moves in certain positions.  Lungs: CTAB no crackles, wheeze, rhonchi Abdomen: soft/nontender/nondistended/normal bowel sounds. No rebound or guarding.  Ext: no edema Skin: warm, dry  EKG: sinus rhythm with rate 61, normal axis, normal intervals, no hypertrophy, no st or t wave changes     Assessment and Plan   # Left chest pain- similar area to constochondritis S:about 9 days ago in [place] on mission trip hiking to a waterfall and got up from rocks using just her left hand and felt a pop or ripping sounds and felt similar sensation. Has had issues in the past with this starting after a warrior pose hold last august- tried dry needling (with PT- tried while seeing for foot), nitro patches (has not helped and had side effects). Yesterday had some coffee and felt palpitations, chest discomfort in the same location- pain went away within 30 minutes resolve-d she had gone to ER but opted to leave (they did not evaluate with labs). Did ride peloton today and had no pain but doesn't feel like pushed herself too much. Did note tingling sensation in  the area with activity in the DR- intermittently notes tingling. Some mild upper back discomfort as  well. Pain is worst with trying to pick object up with left arm when leaning over. Also was not able to shovel and throw over to the left/twist. Ibuprofen helps and warm baths help. She wonders if anxiety could play a role- had some other palpitations on Sunday when discussing symptoms with husband.   -Per records- 03/20/2021 CT cardiac score of 0.  Did see Dr. Oneida Alar in March and diagnosed with costochondritis over left fifth costochondral joint (when I had seen her previously was having right sided issues)-she was encouraged to alter her exercise regimen and trial nitroglycerin patches A/P: Left chest wall pain  likely costochondritis or other msk cause (such as pulled intercostal muscles) starting immediately after a physical maneuver of lifting body up from ground with left hand and recurrent with positions particularly bending over and pulling something up to body with arm. Able to do peloton without issues- not exertional. DId have one episode of discomfort after coffee but otherwise only physical maneuvers cause pain -EKG reassuring -will get rib films per her request on the left (prior similar issues when I saw her in January were on right side before later seeing Dr. Oneida Alar for left sided issues) - doubt DVT/PE- no leg swelling or calf pain noted. No shortness of breath other than once or twice such as with caffeine or when discussing with husband on Sunday events of past week -anxiety certainly contributory but not primary    - could consider SM consult but more for manipulation especially since prior nitroglycerin patches not helpful - unless imaging abnormalities on rib films do not feel strongly about chest CT unless persistent issues  # Diarrhea S:was worse in [place] but has been having intermittent issues now ince at least february 2021 . There was a GI illness in group- several kids in group were sick- ? Lettuce or ice.  -imodium doesn't help - was on probiotics before- last taken about a year ago.  -tried elimination diet without obvious trigger last year with FODMAP- didn't tolerate the diet well A/P: retrial probiotic- did align before- reasonable to trial alternate- GI did mention at last visit follow up if fails to improve- could call to schedule if not makign further improvement    Recommended follow up: Return for next already scheduled visit or sooner if needed. Future Appointments  Date Time Provider [place]  10/01/2021  8:00 AM Marin Olp, MD [facility]    Lab/Order associations:   ICD-10-CM   1. Left-sided chest pain  R07.9 EKG 12-Lead    DG Ribs Unilateral  W/Chest Left     Time Spent: 35 minutes of total time (1:00 pm- 1:35 PM- time in additon to these 35 mins was given for reading and interpreting EKG) was spent on the date of the encounter performing the following actions: chart review prior to seeing the patient, obtaining history, performing a medically necessary exam, counseling on the treatment plan, placing orders, and documenting in our EHR.    Return precautions advised.  Garret Reddish, MD

## 2021-08-05 NOTE — Patient Instructions (Addendum)
EKG today- I will come back in to interpret  Please go to [place]  central X-ray (updated 03/16/2019) - located [address] across the street from [place] - in the basement - Hours: 8:30-5:00 PM M-F (with lunch from 12:30- 1 PM). You do NOT need an appointment.  - Please ensure you are covid symptom free before going in(No fever, chills, cough, congestion, runny nose, shortness of breath, fatigue, body aches, sore throat, headache, nausea, vomiting, diarrhea, or new loss of taste or smell. No known contacts with covid 19 or someone being tested for covid 19)  I want you to try heat 3x a day for 20 minutes ideally. Ok to use sparing ibuprofen  Recommended follow up: Return for next already scheduled visit or sooner if needed.

## 2021-08-07 ENCOUNTER — Other Ambulatory Visit: Payer: Self-pay | Admitting: Obstetrics & Gynecology

## 2021-08-07 DIAGNOSIS — Z1231 Encounter for screening mammogram for malignant neoplasm of breast: Secondary | ICD-10-CM

## 2021-09-10 ENCOUNTER — Ambulatory Visit
Admission: RE | Admit: 2021-09-10 | Discharge: 2021-09-10 | Disposition: A | Discharge status: Home / Self Care | Payer: 59 | Source: Ambulatory Visit | Attending: Obstetrics & Gynecology | Admitting: Obstetrics & Gynecology

## 2021-09-10 DIAGNOSIS — Z1231 Encounter for screening mammogram for malignant neoplasm of breast: Secondary | ICD-10-CM

## 2021-09-11 ENCOUNTER — Encounter: Payer: 59 | Admitting: Family Medicine

## 2021-09-12 ENCOUNTER — Other Ambulatory Visit: Payer: Self-pay | Admitting: Obstetrics & Gynecology

## 2021-09-12 DIAGNOSIS — R928 Other abnormal and inconclusive findings on diagnostic imaging of breast: Secondary | ICD-10-CM

## 2021-09-19 ENCOUNTER — Ambulatory Visit
Admission: RE | Admit: 2021-09-19 | Discharge: 2021-09-19 | Disposition: A | Discharge status: Home / Self Care | Payer: 59 | Source: Ambulatory Visit | Attending: Obstetrics & Gynecology | Admitting: Obstetrics & Gynecology

## 2021-09-19 ENCOUNTER — Ambulatory Visit: Admission: RE | Admit: 2021-09-19 | Payer: 59 | Source: Ambulatory Visit

## 2021-09-19 DIAGNOSIS — R928 Other abnormal and inconclusive findings on diagnostic imaging of breast: Secondary | ICD-10-CM

## 2021-10-01 ENCOUNTER — Ambulatory Visit (INDEPENDENT_AMBULATORY_CARE_PROVIDER_SITE_OTHER): Payer: 59 | Admitting: Family Medicine

## 2021-10-01 ENCOUNTER — Encounter: Payer: Self-pay | Admitting: Family Medicine

## 2021-10-01 VITALS — BP 110/78 | HR 67 | Temp 98.2°F | Ht 64.0 in | Wt 130.2 lb

## 2021-10-01 DIAGNOSIS — Z0000 Encounter for general adult medical examination without abnormal findings: Secondary | ICD-10-CM | POA: Diagnosis not present

## 2021-10-01 DIAGNOSIS — E785 Hyperlipidemia, unspecified: Secondary | ICD-10-CM | POA: Diagnosis not present

## 2021-10-01 DIAGNOSIS — Z23 Encounter for immunization: Secondary | ICD-10-CM | POA: Diagnosis not present

## 2021-10-01 LAB — LIPID PANEL
Cholesterol: 243 mg/dL — ABNORMAL HIGH (ref 0–200)
HDL: 85.1 mg/dL (ref >39.00)
LDL Cholesterol: 149 mg/dL — ABNORMAL HIGH (ref 0–99)
NonHDL: 157.76
Total CHOL/HDL Ratio: 3
Triglycerides: 46 mg/dL (ref 0.0–149.0)
VLDL: 9.2 mg/dL (ref 0.0–40.0)

## 2021-10-01 LAB — CBC WITH DIFFERENTIAL/PLATELET
Basophils Absolute: 0.1 10*3/uL (ref 0.0–0.1)
Basophils Relative: 1.3 % (ref 0.0–3.0)
Eosinophils Absolute: 0.1 10*3/uL (ref 0.0–0.7)
Eosinophils Relative: 3 % (ref 0.0–5.0)
HCT: 45.5 % (ref 36.0–46.0)
Hemoglobin: 15.4 g/dL — ABNORMAL HIGH (ref 12.0–15.0)
Lymphocytes Relative: 28 % (ref 12.0–46.0)
Lymphs Abs: 1.2 10*3/uL (ref 0.7–4.0)
MCHC: 33.8 g/dL (ref 30.0–36.0)
MCV: 98.9 fl (ref 78.0–100.0)
Monocytes Absolute: 0.4 10*3/uL (ref 0.1–1.0)
Monocytes Relative: 8.3 % (ref 3.0–12.0)
Neutro Abs: 2.6 10*3/uL (ref 1.4–7.7)
Neutrophils Relative %: 59.4 % (ref 43.0–77.0)
Platelets: 203 10*3/uL (ref 150.0–400.0)
RBC: 4.6 Mil/uL (ref 3.87–5.11)
RDW: 12.7 % (ref 11.5–15.5)
WBC: 4.3 10*3/uL (ref 4.0–10.5)

## 2021-10-01 LAB — COMPREHENSIVE METABOLIC PANEL
ALT: 24 U/L (ref 0–35)
AST: 25 U/L (ref 0–37)
Albumin: 4.4 g/dL (ref 3.5–5.2)
Alkaline Phosphatase: 60 U/L (ref 39–117)
BUN: 21 mg/dL (ref 6–23)
CO2: 27 mEq/L (ref 19–32)
Calcium: 9.6 mg/dL (ref 8.4–10.5)
Chloride: 106 mEq/L (ref 96–112)
Creatinine, Ser: 0.93 mg/dL (ref 0.40–1.20)
GFR: 68.41 mL/min (ref >60.00)
Glucose, Bld: 89 mg/dL (ref 70–99)
Potassium: 5.8 mEq/L — ABNORMAL HIGH (ref 3.5–5.1)
Sodium: 141 mEq/L (ref 135–145)
Total Bilirubin: 0.6 mg/dL (ref 0.2–1.2)
Total Protein: 7.3 g/dL (ref 6.0–8.3)

## 2021-10-01 NOTE — Patient Instructions (Addendum)
$'1200mg'O$  calcium through diet recommended- see handout  1000 units of vitamin D through pill  Please stop by lab before you go If you have mychart- we will send your results within 3 business days of [place] receiving them.  If you do not have mychart- we will call you about results within 5 business days of [place] receiving them.  *please also note that you will see labs on mychart as soon as they post. I will later go in and write notes on them- will say "notes from Dr. Yong Channel"   Recommended follow up: Return in about 1 year (around 10/02/2022) for physical or sooner if needed.Schedule b4 you leave.

## 2021-10-01 NOTE — Progress Notes (Signed)
Phone [phone]   Subjective:  Patient presents today for their annual physical. Chief complaint-noted.   See problem oriented charting- ROS- full  review of systems was completed and negative except for: seasonal allergies, toe pain- had seen Dr. Amil Amen- had seen Dr. Doran Durand and fields previously. ANA and RF and uric acid negative  The following were reviewed and entered/updated in epic: Past Medical History:  Diagnosis Date   Allergy    seasonal allergies   Arthritis    left great toe joint   Broken arm    right elbow & wrist   Colon polyps    benign   Depression    hx of post partum   Dermoid cyst of ovary    Eye disorder    possible glaucoma-being followed by Dr Tyrone Schimke   Fibrocystic breast    IBS (irritable bowel syndrome)    Plantar fasciitis    left foot 2/15-11/15   Polyp of duodenum 2014   tiny   Tendonitis    right knee 2/15-11/15   Torn rotator cuff    right   Patient Active Problem List   Diagnosis Date Noted   Seasonal allergies 09/09/2020    Priority: Medium    Postcoital UTI 09/09/2020    Priority: Low   Strain of hamstring, subsequent encounter 08/25/2016    Priority: Low   Plantar fasciitis of right foot 05/28/2016    Priority: Low   Acquired hallux limitus of left foot 05/28/2016    Priority: Low   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 10/06/2011    Priority: Low   Talipes cavus 12/28/2007    Priority: Low   FOOT PAIN, CHRONIC 12/20/2007    Priority: Low   Hallux rigidus of both feet 07/15/2021   Costochondritis 03/20/2021   Past Surgical History:  Procedure Laterality Date   BROW LIFT Bilateral 02/22/2020   Procedure: BLEPHAROPLASTY UPPER EYELID; W/EXCESS SKIN BILATERAL;  Surgeon: Karle Starch, MD;  Location: [facility];  Service: Ophthalmology;  Laterality: Bilateral;   COLONOSCOPY  2014   JP-MAC-movi(exc)-poylpoid   POLYPECTOMY  2014   polypoid   WISDOM TOOTH EXTRACTION      Family History  Problem  Relation Age of Onset   Arthritis Mother        47 in 2022. shoulder, knee surgery.   Multiple myeloma Father        73 in 2022. around mid 11s.   Diverticulitis Father    Hearing loss Father        and poor vision   Hepatitis Father        B she believes   Healthy Sister    Healthy Sister    Arthritis Maternal Grandmother    Heart attack Maternal Grandmother        age 24   Arthritis Paternal Grandmother    Leukemia Maternal Uncle    Colon cancer Neg Hx    Colon polyps Neg Hx    Liver disease Neg Hx    Pancreatic cancer Neg Hx    Esophageal cancer Neg Hx    Stomach cancer Neg Hx    Rectal cancer Neg Hx     Medications- reviewed and updated Current Outpatient Medications  Medication Sig Dispense Refill   fluticasone (FLONASE) 50 MCG/ACT nasal spray Place into both nostrils daily.     montelukast (SINGULAIR) 10 MG tablet Take 10 mg by mouth at bedtime.     nitrofurantoin (MACRODANTIN) 100 MG capsule Takes 1 tablet after  intercourse (Patient not taking: Reported on 10/01/2021) 30 capsule 1   No current facility-administered medications for this visit.    Allergies-reviewed and updated No Active Allergies  Social History   Social History Narrative   Married- husband Cecilie Lowers patient of Dr. Yong Channel. 2 children- daughter 19- from [place] to [place] then [place]- getting married, son 79 in [place] in 2022      Financial controller at [employer]: peloton, tennis, walking daily, swimming, reading, Bible study, hiking   Objective  Objective:  BP 110/78   Pulse 67   Temp 98.2 F (36.8 C)   Ht _0  (1.626 m)   Wt 130 lb 3.2 oz (59.1 kg)   LMP 06/20/2017 (Approximate)   SpO2 96%   BMI 22.35 kg/m  Gen: NAD, resting comfortably HEENT: Mucous membranes are moist. Oropharynx normal Neck: no thyromegaly CV: RRR no murmurs rubs or gallops Lungs: CTAB no crackles, wheeze, rhonchi Abdomen: soft/nontender/nondistended/normal bowel sounds. No rebound or guarding.  Ext: no  edema Skin: warm, dry Neuro: grossly normal, moves all extremities, PERRLA   Assessment and Plan   56 y.o. female presenting for annual physical.  Health Maintenance counseling: 1. Anticipatory guidance: Patient counseled regarding regular dental exams -q6 months, eye exams - yearly,  avoiding smoking and second hand smoke , limiting alcohol to 1 beverage per day , no illicit drugs .   2. Risk factor reduction:  Advised patient of need for regular exercise and diet rich and fruits and vegetables to reduce risk of heart attack and stroke.  Exercise- still walking and doing peloton (daily for each) and plays pickleball and golf.  Diet/weight management-reasonable diet/healthy weight.  Wt Readings from Last 3 Encounters:  10/01/21 130 lb 3.2 oz (59.1 kg)  08/05/21 132 lb 3.2 oz (60 kg)  07/15/21 132 lb (59.9 kg)  3. Immunizations/screenings/ancillary studies-discussed hepatitis C and HIV screening-declines.  Flu shot today.  Discussed COVID-19 vaccination being updated- last had covid year ago- opts out  Immunization History  Administered Date(s) Administered   Influenza, High Dose Seasonal PF 04/06/2019   Influenza,inj,Quad PF,6+ Mos 10/26/2017, 09/13/2018, 01/22/2021, 10/01/2021   Influenza-Unspecified 10/30/2016   Moderna Sars-Covid-2 Vaccination 03/22/2019, 04/18/2019, 12/27/2019   Tdap 11/15/2012   Zoster Recombinat (Shingrix) 12/19/2017, 12/11/2018  4. Cervical cancer screening- October 09, 2019-HPV negative so good for 5 years 5. Breast cancer screening-  breast exam with Dr. [name] Heck and mammogram 09/10/2021  3D with follow-up diagnostic mammogram reassuring on 09/19/2021 6. Colon cancer screening - history of sessile serrated polyp- 05/23/20 with Dr. Henrene Pastor with 7 year repeat  7. Skin cancer screening-sees dermatology yearly. advised regular sunscreen use. Denies worrisome, changing, or new skin lesions.  8. Birth control/STD check- postmenopausal and husband with vasectomy/monogamous   9. Osteoporosis screening at 65- bone density ordered last year- osteopenia noted with worst t score -1.6- likely repeat 2 years- still doing weight bearing exercise and  discussed cal/vit D 10. Smoking associated screening -never smoker  Status of chronic or acute concerns   #hyperlipidemia S: Medication: None  -Thankfully CT cardiac scoring of 0 on 03/20/2021 Lab Results  Component Value Date   CHOL 199 10/09/2019   HDL 67 10/09/2019   LDLCALC 97 10/09/2019   TRIG 207 (H) 10/09/2019   CHOLHDL 3.0 10/09/2019  A/P: With reassuring CT cardiac scoring will likely hold off on statin-we will still update lipids to see where they currently stand with last test about 2 years ago-we discussed potentially repeating test  in about 5 years   #Seasonal allergies S: Medication: Singulair 10 mg, Flonase A/P: seeing allergiest- overall stable    #Postcoital UTIs-has nitrofurantoin on hand if needed- has not needed lately   #Chronic diarrhea mainly in Am with some intermittent normalcy-prior GI work-up without cause.  TSH has been normal.   - planning for fall journaling food and seeing nutrionist  #Left chest pain-started immediately after a physical maneuver of lifting by up from around with left hand-was very positional related but had no issues with Peloton back in July 2023.  Reassuring EKG.  Rib films also reassuring.   -today reports still only certain positions but overall mildly improved- but retweaks at times  #Toe pain/joint pain- saw Dr. Amil Amen pending results. Has opinion both from Dr. Oneida Alar and Dr. Doran Durand- potential for surgery on right foot -not taking celebrex- has on hand if needed  Recommended follow up: Return in about 1 year (around 10/02/2022) for physical or sooner if needed.Schedule b4 you leave. Future Appointments  Date Time Provider [place]  12/24/2021 10:15 AM Megan Salon, MD [facility]   Lab/Order associations: fasting   ICD-10-CM   1. Preventative  health care  Z00.00 CBC with Differential/Platelet    Comprehensive metabolic panel    Lipid panel    2. Need for immunization against influenza  Z23 Flu Vaccine QUAD 29moIM (Fluarix, Fluzone & Alfiuria Quad PF)    3. Mild hyperlipidemia  E78.5 CBC with Differential/Platelet    Comprehensive metabolic panel    Lipid panel     No orders of the defined types were placed in this encounter.  Return precautions advised.  SGarret Reddish MD

## 2021-12-24 ENCOUNTER — Encounter (HOSPITAL_BASED_OUTPATIENT_CLINIC_OR_DEPARTMENT_OTHER): Payer: Self-pay | Admitting: Obstetrics & Gynecology

## 2021-12-24 ENCOUNTER — Ambulatory Visit (INDEPENDENT_AMBULATORY_CARE_PROVIDER_SITE_OTHER): Payer: 59 | Admitting: Obstetrics & Gynecology

## 2021-12-24 VITALS — BP 115/78 | HR 71 | Ht 63.0 in | Wt 131.0 lb

## 2021-12-24 DIAGNOSIS — N644 Mastodynia: Secondary | ICD-10-CM | POA: Diagnosis not present

## 2021-12-24 DIAGNOSIS — N390 Urinary tract infection, site not specified: Secondary | ICD-10-CM

## 2021-12-24 DIAGNOSIS — D270 Benign neoplasm of right ovary: Secondary | ICD-10-CM | POA: Diagnosis not present

## 2021-12-24 DIAGNOSIS — Z01419 Encounter for gynecological examination (general) (routine) without abnormal findings: Secondary | ICD-10-CM

## 2021-12-24 NOTE — Progress Notes (Signed)
56 y.o. G93P2002 Married White or Caucasian female here for annual exam.  She's having issues with pain on her left chest/left breast.  Pain has been present for over a year now.  Mammogram did require follow up but this was normal.  Had chest xray 07/2021.  She also had a chest wall ultrasound that showed inflammation.  Pain is now across the whol breast.  She is nervous about this.    Patient's last menstrual period was 06/20/2017 (approximate).          Sexually active: Yes.    The current method of family planning is post menopausal status.     The pregnancy intention screening data noted above was reviewed. Potential methods of contraception were discussed. The patient elected to proceed with No data recorded.  Exercising: Yes.     Smoker:  no  Health Maintenance: Pap:  10/09/19 neg History of abnormal Pap:  no MMG:  09/19/20 Colonoscopy:  05/23/2020, follow up 7 years BMD:   09/10/21, osteopenia Screening Labs: 09/2021   reports that she has never smoked. She has never used smokeless tobacco. She reports current alcohol use of about 4.0 standard drinks of alcohol per week. She reports that she does not use drugs.  Past Medical History:  Diagnosis Date   Allergy    seasonal allergies   Arthritis    left great toe joint   Broken arm    right elbow & wrist   Colon polyps    benign   Depression    hx of post partum   Dermoid cyst of ovary    Eye disorder    possible glaucoma-being followed by Dr Tyrone Schimke   Fibrocystic breast    IBS (irritable bowel syndrome)    Plantar fasciitis    left foot 2/15-11/15   Polyp of duodenum 2014   tiny   Tendonitis    right knee 2/15-11/15   Torn rotator cuff    right    Past Surgical History:  Procedure Laterality Date   BROW LIFT Bilateral 02/22/2020   Procedure: BLEPHAROPLASTY UPPER EYELID; W/EXCESS SKIN BILATERAL;  Surgeon: Karle Starch, MD;  Location: [facility];  Service: Ophthalmology;  Laterality: Bilateral;   COLONOSCOPY  2014    JP-MAC-movi(exc)-poylpoid   POLYPECTOMY  2014   polypoid   WISDOM TOOTH EXTRACTION      Current Outpatient Medications  Medication Sig Dispense Refill   azelastine (ASTELIN) 0.1 % nasal spray Place 1 spray into both nostrils 2 (two) times daily.     montelukast (SINGULAIR) 10 MG tablet Take 10 mg by mouth at bedtime.     nitrofurantoin (MACRODANTIN) 100 MG capsule Takes 1 tablet after intercourse 30 capsule 1   fluticasone (FLONASE) 50 MCG/ACT nasal spray Place into both nostrils daily. (Patient not taking: Reported on 12/24/2021)     No current facility-administered medications for this visit.    Family History  Problem Relation Age of Onset   Arthritis Paternal Grandmother    Arthritis Maternal Grandmother    Heart attack Maternal Grandmother        age 95   Multiple myeloma Father        56 in 2022. around mid 34s.   Diverticulitis Father    Hearing loss Father        and poor vision   Hepatitis Father        B she believes   Macular degeneration Father    Leukemia Father    Conductive hearing loss  Father    Arthritis Mother        74 in 2022. shoulder, knee surgery.   Healthy Sister    Healthy Sister    Leukemia Maternal Uncle    Colon cancer Neg Hx    Colon polyps Neg Hx    Liver disease Neg Hx    Pancreatic cancer Neg Hx    Esophageal cancer Neg Hx    Stomach cancer Neg Hx    Rectal cancer Neg Hx     ROS: Constitutional: negative Genitourinary:negative  Exam:   BP 115/78   Pulse 71   Ht _0  (1.6 m)   Wt 131 lb (59.4 kg)   LMP 06/20/2017 (Approximate)   BMI 23.21 kg/m   Height: _1  (160 cm)  General appearance: alert, cooperative and appears stated age Head: Normocephalic, without obvious abnormality, atraumatic Neck: no adenopathy, supple, symmetrical, trachea midline and thyroid normal to inspection and palpation Lungs: clear to auscultation bilaterally Breasts:normal appearance, no mass, skin change, nipple discharge or LAD; left breast  with focal tenderness on upper outer quadrant. Heart: regular rate and rhythm Abdomen: soft, non-tender; bowel sounds normal; no masses,  no organomegaly Extremities: extremities normal, atraumatic, no cyanosis or edema Skin: Skin color, texture, turgor normal. No rashes or lesions Lymph nodes: Cervical, supraclavicular, and axillary nodes normal. No abnormal inguinal nodes palpated Neurologic: Grossly normal   Pelvic: External genitalia:  no lesions              Urethra:  normal appearing urethra with no masses, tenderness or lesions              Bartholins and Skenes: normal                 Vagina: normal appearing vagina with normal color and no discharge, no lesions              Cervix: no lesions              Pap taken: No. Bimanual Exam:  Uterus:  normal size, contour, position, consistency, mobility, non-tender              Adnexa: normal adnexa and no mass, fullness, tenderness               Rectovaginal: Confirms               Anus:  normal sphincter tone, no lesions  Chaperone, Ezekiel Ina, RN, was present for exam.  Assessment/Plan: 1. Well woman exam with routine gynecological exam - Pap smear 10/09/2019.  Not indicated today. - Mammogram 09/19/2020 - Colonoscopy 05/23/2020, follow up 7 years - Bone mineral density 09/10/21 - lab work done with PCP, Dr. Yong Channel - vaccines reviewed/updated  2. Breast pain, left - has had non diagnostic imaging with diagnostic left mammogram.  This was normal - MR BREAST BILATERAL W WO CONTRAST INC CAD; Future  3. Postcoital UTI - doesn't need RX refilled but will call when/if does  4. Dermoid cyst of right ovary - has follow up done last year

## 2022-01-06 ENCOUNTER — Other Ambulatory Visit (HOSPITAL_BASED_OUTPATIENT_CLINIC_OR_DEPARTMENT_OTHER): Payer: Self-pay | Admitting: Obstetrics & Gynecology

## 2022-01-06 DIAGNOSIS — N644 Mastodynia: Secondary | ICD-10-CM

## 2022-01-07 ENCOUNTER — Telehealth (HOSPITAL_BASED_OUTPATIENT_CLINIC_OR_DEPARTMENT_OTHER): Payer: Self-pay | Admitting: *Deleted

## 2022-01-07 NOTE — Telephone Encounter (Signed)
Called pt to inform her that the breast MRI that had been order was denied by insurance. Advised that Dr. [name] Heck has placed referral to general surgery and that if she has not received a call for scheduling by the end of next week, to let us know. Pt verbalized understanding.

## 2022-01-16 ENCOUNTER — Other Ambulatory Visit: Payer: 59

## 2022-01-27 ENCOUNTER — Ambulatory Visit (INDEPENDENT_AMBULATORY_CARE_PROVIDER_SITE_OTHER): Payer: 59 | Admitting: Sports Medicine

## 2022-01-27 VITALS — BP 110/74 | Ht 64.0 in | Wt 132.0 lb

## 2022-01-27 DIAGNOSIS — M940 Chondrocostal junction syndrome [Tietze]: Secondary | ICD-10-CM

## 2022-01-27 MED ORDER — METHYLPREDNISOLONE ACETATE 40 MG/ML IJ SUSP
40.0000 mg | Freq: Once | INTRAMUSCULAR | Status: AC
  Administered 2022-01-27: 40 mg via INTRA_ARTICULAR

## 2022-01-27 MED ORDER — METHYLPREDNISOLONE ACETATE 40 MG/ML IJ SUSP: 40.0000 mg | Freq: Once | INTRAMUSCULAR | Status: DC

## 2022-01-27 NOTE — Assessment & Plan Note (Signed)
This is probably more related to a costochondral joint subluxation from injury Do not think it is an inflammatory costochondritis but she certainly has costochondral pain  Injection today We may try topical nitroglycerin which has helped her in the past Follow-up to see how she does with this approach

## 2022-01-27 NOTE — Progress Notes (Signed)
Chief complaint left sternocostal pain  I had seen the patient 7 months ago for the same type of pain It may have started after a warrior position in yoga or possibly after doing butterfly stroke and swimming At the time she had some swelling around her fifth left sternocostal joint  She has had some persistent tenderness in this area and it radiates into her breast For that reason she has had a fairly complete breast evaluation including a mammogram and a follow-up mammogram all of which were normal  She states that the pain still persist but sometimes can be sharp with certain movements It is not necessarily present at rest She does not relate it to any viral infections although she has been told she might have costochondritis  Physical exam Pleasant white female in no acute distress BP 110/74   Ht '5\' 4"'$  (1.626 m)   Wt 132 lb (59.9 kg)   LMP 06/20/2017 (Approximate)   BMI 22.66 kg/m   Point tenderness particularly along the left sternal border at the interface with the fourth and fifth ribs Strength testing of the pectoralis major actually stabilizes that it makes it feel better There is no redness No swelling noted Breast tissue is not tender  Ultrasound screen still some swelling noted over the sternocostal joint with some mild calcification.  Procedure:  Injection of left 5th [place] joint Consent obtained and verified. Time-out conducted. Noted no overlying erythema, induration, or other signs of local infection. Skin prepped in a sterile fashion. Topical analgesic spray: Ethyl chloride. Completed without difficulty.  Located on [place] and then palpation guided Meds: 1cc solumedrol 40 + 2 ccs lidocaine  Advised to call if fevers/chills, erythema, induration, drainage, or persistent bleeding.  Immediately after the procedure the patient had some relief of pain and did not feel the same pressure.  Movement of the arm was pain free

## 2022-02-03 ENCOUNTER — Ambulatory Visit (INDEPENDENT_AMBULATORY_CARE_PROVIDER_SITE_OTHER): Payer: 59 | Admitting: Family

## 2022-02-03 ENCOUNTER — Encounter: Payer: Self-pay | Admitting: Family

## 2022-02-03 VITALS — BP 115/82 | HR 70 | Temp 97.3°F | Ht 64.0 in | Wt 133.0 lb

## 2022-02-03 DIAGNOSIS — J069 Acute upper respiratory infection, unspecified: Secondary | ICD-10-CM | POA: Diagnosis not present

## 2022-02-03 MED ORDER — BENZONATATE 200 MG PO CAPS: 200.0000 mg | ORAL_CAPSULE | Freq: Three times a day (TID) | ORAL | 0 refills | Status: AC | PRN

## 2022-02-03 MED ORDER — PREDNISONE 20 MG PO TABS: ORAL_TABLET | ORAL | 0 refills | Status: DC

## 2022-02-03 NOTE — Progress Notes (Signed)
Patient ID: Kimberly Stevens, female    DOB: 03-14-1965, 57 y.o.   MRN: [id number]  Chief Complaint  Patient presents with   Sinus Problem    Pt c/o cough, Nasal congestion and sinus headache for about 6 days. Has tried Nyuil/dayquil and aleve which did help. Covid negative yesterday.    HPI:      URI sx:   Pt c/o productive cough with yellow mucus, Nasal congestion and sinus headache for about 6 days. Denies fever. Cough is persistent and hurting her chest. Has tried Nyuil/dayquil and aleve which did help. Covid negative yesterday.   Assessment & Plan:  1. Viral upper respiratory tract infection - sending pred pack and tessalon pearles, advised on use & SE of both, continue drinking water qd, use nasal saline spray tid and humidifier overnight.  - predniSONE (DELTASONE) 20 MG tablet; Take 2 pills in the morning with breakfast for 3 days, then 1 pill for 2 days  Dispense: 8 tablet; Refill: 0 - benzonatate (TESSALON) 200 MG capsule; Take 1 capsule (200 mg total) by mouth 3 (three) times daily as needed for up to 10 days for cough.  Dispense: 30 capsule; Refill: 0   Subjective:    Outpatient Medications Prior to Visit  Medication Sig Dispense Refill   azelastine (ASTELIN) 0.1 % nasal spray Place 1 spray into both nostrils 2 (two) times daily.     montelukast (SINGULAIR) 10 MG tablet Take 10 mg by mouth at bedtime.     nitrofurantoin (MACRODANTIN) 100 MG capsule Takes 1 tablet after intercourse 30 capsule 1   fluticasone (FLONASE) 50 MCG/ACT nasal spray Place into both nostrils daily. (Patient not taking: Reported on 02/03/2022)     No facility-administered medications prior to visit.   Past Medical History:  Diagnosis Date   Allergy    seasonal allergies   Arthritis    left great toe joint   Broken arm    right elbow & wrist   Colon polyps    benign   Depression    hx of post partum   Dermoid cyst of ovary    Eye disorder    possible glaucoma-being followed by Dr Tyrone Schimke    Fibrocystic breast    IBS (irritable bowel syndrome)    Plantar fasciitis    left foot 2/15-11/15   Polyp of duodenum 2014   tiny   Tendonitis    right knee 2/15-11/15   Torn rotator cuff    right   Past Surgical History:  Procedure Laterality Date   BROW LIFT Bilateral 02/22/2020   Procedure: BLEPHAROPLASTY UPPER EYELID; W/EXCESS SKIN BILATERAL;  Surgeon: Karle Starch, MD;  Location: [facility];  Service: Ophthalmology;  Laterality: Bilateral;   COLONOSCOPY  2014   JP-MAC-movi(exc)-poylpoid   POLYPECTOMY  2014   polypoid   WISDOM TOOTH EXTRACTION     Allergies  Allergen Reactions   Gluten Meal Diarrhea   Lactose Intolerance (Gi)       Objective:    Physical Exam Vitals and nursing note reviewed.  Constitutional:      Appearance: Normal appearance.  Cardiovascular:     Rate and Rhythm: Normal rate and regular rhythm.  Pulmonary:     Effort: Pulmonary effort is normal.     Breath sounds: Normal breath sounds.  Musculoskeletal:        General: Normal range of motion.  Skin:    General: Skin is warm and dry.  Neurological:     Mental Status:  She is alert.  Psychiatric:        Mood and Affect: Mood normal.        Behavior: Behavior normal.    BP 115/82 (BP Location: Left Arm, Patient Position: Sitting, Cuff Size: Large)   Pulse 70   Temp (!) 97.3 F (36.3 C) (Temporal)   Ht _0  (1.626 m)   Wt 133 lb (60.3 kg)   LMP 06/20/2017 (Approximate)   SpO2 95%   BMI 22.83 kg/m  Wt Readings from Last 3 Encounters:  02/03/22 133 lb (60.3 kg)  01/27/22 132 lb (59.9 kg)  12/24/21 131 lb (59.4 kg)       Jeanie Sewer, NP

## 2022-02-03 NOTE — Patient Instructions (Signed)
It was very nice to see you today!   I have sent the prednisone and tessalon pearles for your cough to the [facility] on [address]  Joycelyn Man drinking up to 2.5 liters water daily.  I recommend using a generic nasal saline spray several times a day to disinfect your sinuses and moisturize. This is a good habit throughout the winter season. Also can use a humidifier overnight to prevent dry sinuses and cough in the mornings.  Hope you feel better soon!       PLEASE NOTE:  If you had any lab tests please let us know if you have not heard back within a few days. You may see your results on MyChart before we have a chance to review them but we will give you a call once they are reviewed by [place]. If we ordered any referrals today, please let us know if you have not heard from their office within the next week.

## 2022-04-20 ENCOUNTER — Encounter: Payer: Self-pay | Admitting: Physician Assistant

## 2022-04-20 ENCOUNTER — Ambulatory Visit (INDEPENDENT_AMBULATORY_CARE_PROVIDER_SITE_OTHER): Payer: 59 | Admitting: Physician Assistant

## 2022-04-20 VITALS — BP 118/76 | HR 70 | Temp 97.8°F | Ht 64.0 in | Wt 132.0 lb

## 2022-04-20 DIAGNOSIS — R051 Acute cough: Secondary | ICD-10-CM

## 2022-04-20 MED ORDER — BENZONATATE 100 MG PO CAPS: 100.0000 mg | ORAL_CAPSULE | Freq: Two times a day (BID) | ORAL | 0 refills | Status: DC | PRN

## 2022-04-20 MED ORDER — FLUCONAZOLE 150 MG PO TABS: ORAL_TABLET | ORAL | 0 refills | Status: DC

## 2022-04-20 MED ORDER — AMOXICILLIN-POT CLAVULANATE 875-125 MG PO TABS: 1.0000 | ORAL_TABLET | Freq: Two times a day (BID) | ORAL | 0 refills | Status: DC

## 2022-04-20 NOTE — Progress Notes (Signed)
Kimberly Stevens is a 57 y.o. female here for a new problem.  History of Present Illness:   Chief Complaint  Patient presents with   Cough    Pt  c/o cough x 2 weeks, worse at night, having coughing spells, cough and expectorating dark green thick sputum. Denies fever or chills.    HPI  Cough: She complains of a persistent coughing spells for 2 weeks.  She states her cough is productive with thick green mucus.  Her coughing spells can last up to 30 minutes.  Sh has been taking Emergen-C, doing saline rinse in the mornings, and Singulair at night.  She was unable to take cough syrup while recently traveling but was able to take tessalon cough pearls which help but only for a few hours of relief. Her coughing spells have been waking her up throughout the night.  She continues to go on her walks when she is up for it but notes she has not been able to walk her usual 3 miles.   Denies chest pain, SOB.  Past Medical History:  Diagnosis Date   Allergy    seasonal allergies   Arthritis    left great toe joint   Broken arm    right elbow & wrist   Colon polyps    benign   Depression    hx of post partum   Dermoid cyst of ovary    Eye disorder    possible glaucoma-being followed by Dr Tyrone Schimke   Fibrocystic breast    IBS (irritable bowel syndrome)    Plantar fasciitis    left foot 2/15-11/15   Polyp of duodenum 2014   tiny   Tendonitis    right knee 2/15-11/15   Torn rotator cuff    right     Social History   Tobacco Use   Smoking status: Never   Smokeless tobacco: Never  Vaping Use   Vaping Use: Never used  Substance Use Topics   Alcohol use: Yes    Alcohol/week: 4.0 standard drinks of alcohol    Types: 4 Standard drinks or equivalent per week    Comment: 4 per week   Drug use: No    Past Surgical History:  Procedure Laterality Date   BROW LIFT Bilateral 02/22/2020   Procedure: BLEPHAROPLASTY UPPER EYELID; W/EXCESS SKIN BILATERAL;  Surgeon: Karle Starch, MD;   [facility];  Service: Ophthalmology;  Laterality: Bilateral;   COLONOSCOPY  2014   JP-MAC-movi(exc)-poylpoid   POLYPECTOMY  2014   polypoid   WISDOM TOOTH EXTRACTION      Family History  Problem Relation Age of Onset   Arthritis Paternal Grandmother    Arthritis Maternal Grandmother    Heart attack Maternal Grandmother        age 34   Multiple myeloma Father        97 in 2022. around mid 26s.   Diverticulitis Father    Hearing loss Father        and poor vision   Hepatitis Father        B she believes   Macular degeneration Father    Leukemia Father    Conductive hearing loss Father    Arthritis Mother        39 in 2022. shoulder, knee surgery.   Healthy Sister    Healthy Sister    Leukemia Maternal Uncle    Colon cancer Neg Hx    Colon polyps Neg Hx    Liver disease  Neg Hx    Pancreatic cancer Neg Hx    Esophageal cancer Neg Hx    Stomach cancer Neg Hx    Rectal cancer Neg Hx     Allergies  Allergen Reactions   Gluten Meal Diarrhea   Lactose Intolerance (Gi)     Current Medications:   Current Outpatient Medications:    amoxicillin-clavulanate (AUGMENTIN) 875-125 MG tablet, Take 1 tablet by mouth 2 (two) times daily., Disp: 14 tablet, Rfl: 0   azelastine (ASTELIN) 0.1 % nasal spray, Place 1 spray into both nostrils 2 (two) times daily., Disp: , Rfl:    benzonatate (TESSALON) 100 MG capsule, Take 1 capsule (100 mg total) by mouth 2 (two) times daily as needed for cough., Disp: 30 capsule, Rfl: 0   fluconazole (DIFLUCAN) 150 MG tablet, Take one tablet when symptoms start and may repeat in 3-5 days if needed., Disp: 1 tablet, Rfl: 0   montelukast (SINGULAIR) 10 MG tablet, Take 10 mg by mouth at bedtime., Disp: , Rfl:    nitrofurantoin (MACRODANTIN) 100 MG capsule, Takes 1 tablet after intercourse, Disp: 30 capsule, Rfl: 1   Review of Systems:   Review of Systems  Respiratory:  Positive for cough (persistent and productive) and sputum production  (thick and green).     Vitals:   Vitals:   04/20/22 1533  BP: 118/76  Pulse: 70  Temp: 97.8 F (36.6 C)  TempSrc: Temporal  SpO2: 97%  Weight: 132 lb (59.9 kg)  Height: 5\' 4"  (1.626 m)     Body mass index is 22.66 kg/m.  Physical Exam:   Physical Exam Vitals and nursing note reviewed.  Constitutional:      General: She is not in acute distress.    Appearance: She is well-developed. She is not ill-appearing or toxic-appearing.  HENT:     Head: Normocephalic and atraumatic.     Right Ear: Tympanic membrane, ear canal and external ear normal. Tympanic membrane is not erythematous, retracted or bulging.     Left Ear: Tympanic membrane, ear canal and external ear normal. Tympanic membrane is not erythematous, retracted or bulging.     Nose: Nose normal.     Right Sinus: No maxillary sinus tenderness or frontal sinus tenderness.     Left Sinus: No maxillary sinus tenderness or frontal sinus tenderness.     Mouth/Throat:     Pharynx: Uvula midline. No posterior oropharyngeal erythema.  Eyes:     General: Lids are normal.     Conjunctiva/sclera: Conjunctivae normal.  Neck:     Trachea: Trachea normal.  Cardiovascular:     Rate and Rhythm: Normal rate and regular rhythm.     Heart sounds: Normal heart sounds, S1 normal and S2 normal.  Pulmonary:     Effort: Pulmonary effort is normal.     Breath sounds: Normal breath sounds. No decreased breath sounds, wheezing, rhonchi or rales.  Lymphadenopathy:     Cervical: No cervical adenopathy.  Skin:    General: Skin is warm and dry.  Neurological:     Mental Status: She is alert.  Psychiatric:        Speech: Speech normal.        Behavior: Behavior normal. Behavior is cooperative.     Assessment and Plan:   Acute cough No red flags on exam.  Will initiate augmentin per orders. I have also sent in diflucan for abx-associated yeast infection which she can be prone to. I have also sent in Jenkins for her  to have as  well.  Discussed taking medications as prescribed. Reviewed return precautions including worsening fever, SOB, worsening cough or other concerns. Push fluids and rest. I recommend that patient follow-up if symptoms worsen or persist despite treatment x 7-10 days, sooner if needed.    I,Rachel Rivera,acting as a Education administrator for [employer], PA.,have documented all relevant documentation on the behalf of Inda Coke, PA,as directed by  Inda Coke, PA while in the presence of Inda Coke, [place].  I, Inda Coke, [place], have reviewed all documentation for this visit. The documentation on 04/20/22 for the exam, diagnosis, procedures, and orders are all accurate and complete.   Inda Coke, PA-C

## 2022-04-27 ENCOUNTER — Other Ambulatory Visit: Payer: Self-pay | Admitting: Physician Assistant

## 2022-05-04 ENCOUNTER — Encounter: Payer: Self-pay | Admitting: Internal Medicine

## 2022-05-04 ENCOUNTER — Ambulatory Visit (INDEPENDENT_AMBULATORY_CARE_PROVIDER_SITE_OTHER): Payer: 59 | Admitting: Internal Medicine

## 2022-05-04 VITALS — BP 102/70 | HR 83 | Temp 97.6°F | Ht 64.0 in | Wt 134.4 lb

## 2022-05-04 DIAGNOSIS — R053 Chronic cough: Secondary | ICD-10-CM | POA: Insufficient documentation

## 2022-05-04 MED ORDER — ALBUTEROL SULFATE HFA 108 (90 BASE) MCG/ACT IN AERS: 2.0000 | INHALATION_SPRAY | Freq: Four times a day (QID) | RESPIRATORY_TRACT | 2 refills | Status: DC | PRN

## 2022-05-04 MED ORDER — AMOXICILLIN-POT CLAVULANATE 875-125 MG PO TABS: 1.0000 | ORAL_TABLET | Freq: Two times a day (BID) | ORAL | 0 refills | Status: DC

## 2022-05-04 MED ORDER — PREDNISONE 20 MG PO TABS: ORAL_TABLET | ORAL | 0 refills | Status: DC

## 2022-05-04 NOTE — Progress Notes (Signed)
[facility]: [phone]   Routine Medical Office Visit  Patient:  Kimberly Stevens      Age: 57 y.o.       Sex:  female  Date:   05/04/2022 PCP:    Shelva Majestic, MD   Today's Healthcare Provider: Lula Olszewski, MD   Assessment and Plan:   Chronic cough Assessment & Plan: The chronic cough seems to have been improved by Augmentin so there is a chance that there was an bacterial infection that was cleared up by that but the symptoms did not totally resolve leaving the possible explanations most likely to be chronic allergies which set her up for the chronic sinus sinusitis in the first place.  There is the possibility of residual infection and so I will give another 10-day course of Augmentin but I advised her to only take that if she sees purulence and I explained there are risks of taking this medication specifically C. difficile colitis.  There is also possibility that there is a viral infection going on but we really do not have anything to treat that with and she is already tested negative for COVID so I do not see much value in repeating it.  I therefore recommended that we treat it as primarily a condition of her allergies flaring and she is going to contact her allergist about this and see if that is possibly related with her current dosage of medicine not working as well.  We are in the middle of pollen season and I explained that it is very important to rinse out the sinuses with nightly saline rinse at this time of year but really every night all year-round in [place].  That will be helpful regardless of the cause and she does it already.  I clarified that the antibiotics are only recommended if she gets high fevers starts having pus or ear pain.  So I think the next steps is to try inhalers and maybe even a steroid burst and run the situation past her allergist.  If none of that works then the neck step is getting a chest x-ray and seeing a lung  specialist  After I looked up her nose I saw very marked swelling and faint erythema/pinkness that is most consistent with severe allergic rhinitis and so I advised her probably to just take the steroid and none of the other medicines I sent those are mostly just adding in case options.  And I advised her the most important thing is to call the allergist and get in there soon and I encouraged her to start Flonase which was recently held due to long-term use  Orders: -     Amoxicillin-Pot Clavulanate; Take 1 tablet by mouth 2 (two) times daily.  Dispense: 14 tablet; Refill: 0 -     predniSONE; Take 2 pills for 3 days, 1 pill for 4 days  Dispense: 10 tablet; Refill: 0 -     Albuterol Sulfate HFA; Inhale 2 puffs into the lungs every 6 (six) hours as needed for wheezing or shortness of breath.  Dispense: 1 each; Refill: 2   Overall, in my medical opinion, this seems to be  a severe flare of chronic allergic rhinitis with post nasal drip and not infectious at this time.  Given the severity I advised patient to get close follow up with allergist and Primary Care Provider (PCP).    Clinical Presentation:   56 y.o. female here today for Ongoing productive cough (  For about a month. Producing dark gold mucus.), Slight nasal congestion, Slight chest congestion/tightness, and COVID test negative (Taken last week.) Past medical history significant for allergies and costochondritis Main reason we here is could be bronchitis and duration is getting annoying 4 weeks  Reviewed:  has a past medical history of Allergy, Arthritis, Broken arm, Colon polyps, Depression, Dermoid cyst of ovary, Eye disorder, Fibrocystic breast, IBS (irritable bowel syndrome), Plantar fasciitis, Polyp of duodenum (2014), Tendonitis, and Torn rotator cuff. 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 10/06/2011    Plantar fasciitis of right foot 05/28/2016   Acquired hallux limitus of left foot 05/28/2016   Strain of hamstring, subsequent encounter 08/25/2016   Seasonal allergies 09/09/2020   Postcoital UTI 09/09/2020   Costochondritis 03/20/2021   Hallux rigidus of both feet 07/15/2021   Chronic cough 05/04/2022   Resolved Ambulatory Problems    Diagnosis Date Noted   Benign neoplasm of unspecified site 12/13/2006   URI 12/13/2006   Cellulitis and abscess of unspecified digit 09/10/2009   KNEE PAIN, RIGHT 06/08/2008   OTHER SYMPTOMS INVOLVING DIGESTIVE SYSTEM OTHER 07/25/2008   EPIGASTRIC PAIN 07/19/2008   ABDOMINAL PAIN -GENERALIZED 07/25/2008   MUSCLE STRAIN, HAMSTRING MUSCLE 12/20/2007   Pain in joint, pelvic region and thigh 03/08/2012   Left Achilles tendinitis 11/08/2012   Plantar fasciitis of left foot 07/20/2013   Patellar tendinitis of right knee 07/20/2013   Greater trochanteric bursitis of right hip 12/21/2013   Lateral epicondylitis of left elbow 05/10/2014   Lateral epicondylitis of right elbow 04/09/2015   Upper back pain on right side 12/03/2015   Biceps tendinopathy of right upper extremity 05/16/2019   Past Medical History:  Diagnosis Date   Allergy    Arthritis    Broken arm    Colon polyps    Depression    Dermoid cyst of ovary    Eye disorder    Fibrocystic breast    IBS (irritable bowel syndrome)    Plantar fasciitis    Polyp of duodenum 2014   Tendonitis    Torn rotator cuff     Outpatient Medications Prior to Visit  Medication Sig   azelastine (ASTELIN) 0.1 % nasal spray Place 1 spray into both nostrils 2 (two) times daily.   benzonatate (TESSALON) 100 MG capsule Take 1 capsule (100 mg total) by mouth 2 (two) times daily as needed for cough.   montelukast (SINGULAIR) 10 MG tablet Take 10 mg by mouth at bedtime.   nitrofurantoin (MACRODANTIN) 100 MG capsule Takes 1 tablet after intercourse   fluconazole (DIFLUCAN) 150 MG tablet Take one tablet when  symptoms start and may repeat in 3-5 days if needed. (Patient not taking: Reported on 05/04/2022)   [DISCONTINUED] amoxicillin-clavulanate (AUGMENTIN) 875-125 MG tablet Take 1 tablet by mouth 2 (two) times daily. (Patient not taking: Reported on 05/04/2022)   No facility-administered medications prior to visit.   She is not taking the macrodantin its as needed, and she finished the Augmentin last week- it probably helped a little  since she is not coughing all night long any more  Cough This is a chronic problem. The current episode started more than 1 month ago. The problem has been waxing and waning. The problem occurs hourly. The cough is Productive of sputum and productive of brown sputum. Associated symptoms include nasal congestion, postnasal drip, rhinorrhea (responds to  azelastine spray) and wheezing. Pertinent negatives include no chest pain (does feel tighter and wears ntg for another cause), chills, ear congestion, ear pain, fever, headaches, heartburn, hemoptysis, myalgias (has unrelated to cough syndrome so listing as neg), rash, sore throat, shortness of breath, sweats or weight loss. The symptoms are aggravated by lying down. She has tried body position changes, OTC cough suppressant and leukotriene antagonists for the symptoms. The treatment provided moderate relief. Her past medical history is significant for environmental allergies. There is no history of asthma, bronchiectasis, bronchitis, COPD, emphysema or pneumonia.    Updated and modified:  Problem  Chronic Cough    Hasn't had XR yet        Clinical Data Analysis:   Physical Exam  BP 102/70 (BP Location: Left Arm, Patient Position: Sitting)   Pulse 83   Temp 97.6 F (36.4 C) (Temporal)   Ht 5\' 4"  (1.626 m)   Wt 134 lb 6.4 oz (61 kg)   LMP 06/20/2017 (Approximate)   SpO2 96%   BMI 23.07 kg/m  Wt Readings from Last 10 Encounters:  05/04/22 134 lb 6.4 oz (61 kg)  04/20/22 132 lb (59.9 kg)  02/03/22 133 lb (60.3 kg)   01/27/22 132 lb (59.9 kg)  12/24/21 131 lb (59.4 kg)  10/01/21 130 lb 3.2 oz (59.1 kg)  08/05/21 132 lb 3.2 oz (60 kg)  07/15/21 132 lb (59.9 kg)  05/19/21 135 lb 9.6 oz (61.5 kg)  03/20/21 131 lb (59.4 kg)   Vital signs reviewed.  Nursing notes reviewed. Weight trend reviewed. Abnormalities and Problem-Specific physical exam findings:  swollen erythematous nasal mucosa, tympanic membrane normal bilateral , clear to auscultation bilaterally and s1 and s2 heart sounds have regular rate and rhythm  General Appearance:  No acute distress appreciable.   Well-groomed, healthy-appearing female.  Well proportioned with no abnormal fat distribution.  Good muscle tone. Skin: Clear and well-hydrated. Pulmonary:  Normal work of breathing at rest, no respiratory distress apparent. SpO2: 96 %  Musculoskeletal: All extremities are intact.  Neurological:  Awake, alert, oriented, and engaged.  No obvious focal neurological deficits or cognitive impairments.  Sensorium seems unclouded. Gait is smooth and coordinated.  Speech is clear and coherent with logical content. Psychiatric:  Appropriate mood, pleasant and cooperative demeanor, cheerful and engaged during the exam   Additional Results Reviewed:     No results found for any visits on 05/04/22.  No results found for this or any previous visit (from the past 2160 hour(s)).  No image results found.   No results found.   --------------------------------    Signed: Lula Olszewski, MD 05/04/2022 8:33 PM

## 2022-05-04 NOTE — Assessment & Plan Note (Signed)
The chronic cough seems to have been improved by Augmentin so there is a chance that there was an bacterial infection that was cleared up by that but the symptoms did not totally resolve leaving the possible explanations most likely to be chronic allergies which set her up for the chronic sinus sinusitis in the first place.  There is the possibility of residual infection and so I will give another 10-day course of Augmentin but I advised her to only take that if she sees purulence and I explained there are risks of taking this medication specifically C. difficile colitis.  There is also possibility that there is a viral infection going on but we really do not have anything to treat that with and she is already tested negative for COVID so I do not see much value in repeating it.  I therefore recommended that we treat it as primarily a condition of her allergies flaring and she is going to contact her allergist about this and see if that is possibly related with her current dosage of medicine not working as well.  We are in the middle of pollen season and I explained that it is very important to rinse out the sinuses with nightly saline rinse at this time of year but really every night all year-round in [place].  That will be helpful regardless of the cause and she does it already.  I clarified that the antibiotics are only recommended if she gets high fevers starts having pus or ear pain.  So I think the next steps is to try inhalers and maybe even a steroid burst and run the situation past her allergist.  If none of that works then the neck step is getting a chest x-ray and seeing a lung specialist  After I looked up her nose I saw very marked swelling and faint erythema/pinkness that is most consistent with severe allergic rhinitis and so I advised her probably to just take the steroid and none of the other medicines I sent those are mostly just adding in case options.  And I advised her the most  important thing is to call the allergist and get in there soon and I encouraged her to start Flonase which was recently held due to long-term use

## 2022-05-04 NOTE — Patient Instructions (Signed)
It was a pleasure seeing you today!  Your health and satisfaction are my top priorities. If you believe your experience today was worthy of a 5-star rating, I would be grateful for your feedback! Lula Olszewski, MD   [x]    I always recommend close follow up (within 1-2 weeks) with your Primary Care Provider (PCP) for any acute problems.  If you are not doing well:  Return to the office sooner.  If your condition begins worsening or become severe or you can't get a sooner appointment :  go to the emergency room.  [x]    Please carefully review your clinical instructions and notes on MyChart (they will be finished later)   [x]    X-rays can be obtained at the  [facility] office. You can walk in M-F between 8:30 am- noon or 1pm-5pm. Tell them you are there for X-rays ordered by me. They will send me the results, then I will let you know the results with instructions. Address: [address].  The Xray department is located in the basement.      [x]    Labs that cannot be completed today should be scheduled at checkout or call [phone] to schedule.  Today's draft of the physician documented plan for today's visit: (final revisions will be visible on MyChart chart later)  Chronic cough Assessment & Plan: The chronic cough seems to have been improved by Augmentin so there is a chance that there was an bacterial infection that was cleared up by that but the symptoms did not totally resolve leaving the possible explanations most likely to be chronic allergies which set her up for the chronic sinus sinusitis in the first place.  There is the possibility of residual infection and so I will give another 10-day course of Augmentin but I advised her to only take that if she sees purulence and I explained there are risks of taking this medication specifically C. difficile colitis.  There is also possibility that there is a viral infection going on but we really do not have anything to treat that  with and she is already tested negative for COVID so I do not see much value in repeating it.  I therefore recommended that we treat it as primarily a condition of her allergies flaring and she is going to contact her allergist about this and see if that is possibly related with her current dosage of medicine not working as well.  We are in the middle of pollen season and I explained that it is very important to rinse out the sinuses with nightly saline rinse at this time of year but really every night all year-round in [place].  That will be helpful regardless of the cause and she does it already.  I clarified that the antibiotics are only recommended if she gets high fevers starts having pus or ear pain.  So I think the next steps is to try inhalers and maybe even a steroid burst and run the situation past her allergist.  If none of that works then the neck step is getting a chest x-ray and seeing a lung specialist  After I looked up her nose I saw very marked swelling and faint erythema/pinkness that is most consistent with severe allergic rhinitis and so I advised her probably to just take the steroid and none of the other medicines I sent those are mostly just adding in case options.  And I advised her the most important thing  is to call the allergist and get in there soon and I encouraged her to start Flonase which was recently held due to long-term use  Orders: -     Amoxicillin-Pot Clavulanate; Take 1 tablet by mouth 2 (two) times daily.  Dispense: 14 tablet; Refill: 0 -     predniSONE; Take 2 pills for 3 days, 1 pill for 4 days  Dispense: 10 tablet; Refill: 0 -     Albuterol Sulfate HFA; Inhale 2 puffs into the lungs every 6 (six) hours as needed for wheezing or shortness of breath.  Dispense: 1 each; Refill: 2     QUESTIONS & CONCERNS: CLINICAL: please contact us via phone [phone] OR MyChart messaging  LAB & IMAGING:   We will call you if the results are significantly abnormal or  you don't use MyChart.  Most normal results will be posted to MyChart immediately and have a clinical review message by Dr. Jon Billings posted within 2-3 business days.   If you have not heard from [place] regarding the results in 2 weeks OR if you need priority reporting, please contact this office. MYCHART:  The fastest way to get your results and easiest way to stay in touch with [place] is by activating your My Chart account. Instructions are located on the last page of this paperwork.  BILLING: xray and lab orders are billed from separate companies and questions./concerns should be directed to the invoicing company.  For visit charges please discuss with our administrative services. COMPLAINTS:  please let Dr. Jon Billings know or see the [facility] Administrator - Edwena Felty or [facility], by asking at the front desk: we want you to be satisfied with every experience and we would be grateful for the opportunity to address any problems.

## 2022-05-20 ENCOUNTER — Other Ambulatory Visit: Payer: Self-pay

## 2022-05-20 MED ORDER — NITROGLYCERIN 0.2 MG/HR TD PT24: MEDICATED_PATCH | TRANSDERMAL | 0 refills | Status: DC

## 2022-05-20 NOTE — Progress Notes (Signed)
Pt is asking refill on nitro patches, she is totally out of patches.  Pharmacy is in chart.

## 2022-06-09 NOTE — Progress Notes (Signed)
Faxed OV note To Eileen Stanford, MD.

## 2022-07-21 ENCOUNTER — Ambulatory Visit (INDEPENDENT_AMBULATORY_CARE_PROVIDER_SITE_OTHER): Payer: 59 | Admitting: Family Medicine

## 2022-07-21 ENCOUNTER — Encounter: Payer: Self-pay | Admitting: Family Medicine

## 2022-07-21 VITALS — BP 110/70 | HR 62 | Temp 97.9°F | Ht 64.0 in | Wt 133.0 lb

## 2022-07-21 DIAGNOSIS — R0989 Other specified symptoms and signs involving the circulatory and respiratory systems: Secondary | ICD-10-CM

## 2022-07-21 DIAGNOSIS — R053 Chronic cough: Secondary | ICD-10-CM

## 2022-07-21 DIAGNOSIS — R5383 Other fatigue: Secondary | ICD-10-CM | POA: Diagnosis not present

## 2022-07-21 LAB — POC COVID19 BINAXNOW: SARS Coronavirus 2 Ag: NEGATIVE

## 2022-07-21 MED ORDER — PREDNISONE 20 MG PO TABS
ORAL_TABLET | ORAL | 0 refills | Status: DC
Start: 2022-07-21 — End: 2022-09-30

## 2022-07-21 MED ORDER — OMEPRAZOLE 20 MG PO CPDR
20.0000 mg | DELAYED_RELEASE_CAPSULE | Freq: Every day | ORAL | 3 refills | Status: AC
Start: 2022-07-21 — End: ?

## 2022-07-21 NOTE — Patient Instructions (Addendum)
Please go to [facility] lab - located [address] across the [address] from [place] - in the basement - Hours: 7:30-5:30 PM M-F. You do NOT need an appointment.    Please go to [facility]  [facility]  - located [address] from [place] - in the basement - Hours: 8:30-5:00 PM M-F (with lunch from 12:30- 1 PM). You do NOT need an appointment.    Trial omeprazole 20 mg bfeore dinner add allegra back in before bed Trial prednisone in the morning  We have placed a referral for you today to [facility]. In some cases you will see # listed below- you can call this if you have not heard within a week. If you do not see # listed- you should receive a mychart message or phone call within a week with the # to call. Reach out to [place] if you ar enot scheduled within 2 weeks    Recommended follow up: Return for as needed for new, worsening, persistent symptoms.

## 2022-07-21 NOTE — Progress Notes (Signed)
Phone [phone] In person visit   Subjective:   Kimberly Stevens is a 57 y.o. year old very pleasant female patient who presents for/with See problem oriented charting Chief Complaint  Patient presents with   congestion    Pt c/o nasal and chest and congestion that has been going on for a while with rattling in her chest. Pt states she coughs up green "booger" like substance.   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     omeprazole (PRILOSEC) 20 MG capsule Take 1 capsule (20 mg total) by mouth daily. 30 capsule 3   predniSONE (DELTASONE) 20 MG tablet Take 2 pills for 3 days, 1 pill for 4 days 10 tablet 0   azelastine (ASTELIN) 0.1 % nasal spray Place 1 spray into both nostrils 2 (two) times daily.     nitrofurantoin (MACRODANTIN) 100 MG capsule Takes 1 tablet after intercourse 30 capsule 1   No current facility-administered medications for this visit.     Objective:  BP 110/70   Pulse 62   Temp 97.9 F (36.6 C)   Ht 5\' 4"  (1.626 m)   Wt 133 lb (60.3 kg)   LMP 06/20/2017 (Approximate)   SpO2 100%   BMI 22.83 kg/m  Gen: NAD, resting comfortably Mild swelling of tonsils and minimal  lymphadenopathy.  Tympanic membrane's normal bilaterally.  Oropharynx largely normal CV: RRR no murmurs rubs or gallops Lungs: CTAB no crackles, wheeze, rhonchi Ext: no edema Skin: warm, dry     Assessment and Plan   # Nasal and chest congestion S: Patient reports symptoms started at least in January with worsening- but she looked back in her journal and has had issues dating back to 2022  On January 16th saw Dulce Sellar, NP thought to have viral upper respiratory infection (URI) at that point and was given prednisone and tessalon- feels like the overall cold improved but cough persisted.   Then was een again on 04/20/22 by Jarold Motto, PA as had worsening coughing spells for 2 weeks at that point- they opted to treat with Augmentin.   Then saw Dr. Jon Billings on 05/04/22 with improvement but continuation of symptoms- they tried albuterol along with prednisone- cough persisted but cough fits improved  She reports still gets coughing fits with laughing or exercise. Dry throat can trigger as well. POST NASAL DRIP at night- gets Notes purulent green sputum production little blebs either at night or larger chunks in the morning. Gags if can't get out and can cause dry heaving. She complains of a rattling sensation in her chest at times.   - still sees allergist and is  on astelin and singulair from allergist- sees allergist in next 4 weeks- last seen in July of 2023 with Dr. Gary Fleet. Has been tested for allergy. Has not been tested for asthma.  -has noted more nasal sounding voice. Notes deep sinus pressure. No dental pain which she has had in past. Also notes fatigue with this  Other symptoms: -still getting left sided chest pain that she is seen in sports medicine about in the past-started after position change and has not resolved -since 2021 ongoing loose stools in the morning and gassiness - husband has the same. Has remained gluten restricted - very minimal. Dropped diet coke with lent-  and didn't improve.   From CT cardiac scoring "Mediastinum/nodes: No adenopathy or mass. Calcified subcarinal, right paratracheal and right hilar lymph nodes compatible with remote granulomatous disease.   Lungs/pleura: No pleural effusion, airspace consolidation, or pneumothorax. Calcified granuloma identified within the posterolateral right middle lobe. No suspicious lung nodules." -No recent chest x-ray A/P: Patient with 2 years of chronic cough-some variations with different illnesses but overall has persisted.  No family history of lung cancer but with persistence of cough we opted to obtain chest x-ray. - We discussed possibilities for chronic cough contributors: 1.  Acid reflux/GERD-no known history but she is willing to try omeprazole 20 mg before dinner 2.  Not on ACE inhibitor and no other obvious medicines to contribute to cough 3.  Doubt asthma-no wheezing and inhaler was not helpful 4.  Allergies likely contribute but already on Singulair and Astelin-we opted to add Allegra which she reports has been helpful in the past -We also opted to trial another course of prednisone as another data point and to try to calm down the cough in general and produce some quality of life - I did recommend referral to pulmonary for chronic cough.  We discussed if she has not seen substantial improvement either considering chest CT or CT of the sinuses with deep-seated pressure.  CT of the chest would also give [place] some information with the chest wall pain she has had for some time - Consider cough suppressant but prior Tessalon and Delsym did not help - Also discussed possible ENT consult with deeper sinus pressure, cough, some sore throat, perhaps mildly enlarged tonsils/lymph nodes-particularly if findings on sinus CT -We have a September appointment scheduled but she should follow-up -Also recommended she see her allergist Dr. Gary Fleet for her opinion-has upcoming visit thankfully  Patient also  reports fatigue so we opted to update labs to evaluate including B12 and vitamin D  Recommended follow up: Return for as needed for new, worsening, persistent symptoms. Future Appointments  Date Time Provider Department Center  10/06/2022  8:00 AM Shelva Majestic, MD [facility]    Lab/Order associations:   ICD-10-CM   1. Chest congestion  R09.89 POC COVID-19    CBC with Differential/Platelet    Comprehensive metabolic panel    DG Chest 2 View    Ambulatory referral to Pulmonology    CANCELED: DG Chest 2 View    CANCELED: Comprehensive metabolic panel    CANCELED: CBC with Differential/Platelet    CANCELED: CBC with Differential/Platelet    CANCELED: Comprehensive metabolic panel    CANCELED: DG Chest 2 View    2. Chronic cough  R05.3 CBC with Differential/Platelet    Comprehensive metabolic panel    DG Chest 2 View    Ambulatory referral to Pulmonology    CANCELED: DG Chest 2 View    CANCELED: Comprehensive  metabolic panel    CANCELED: CBC with Differential/Platelet    CANCELED: CBC with Differential/Platelet    CANCELED: Comprehensive metabolic panel    CANCELED: DG Chest 2 View    3. Fatigue, unspecified type  R53.83 CBC with Differential/Platelet    Comprehensive metabolic panel    TSH    Vitamin B12    VITAMIN D 25 Hydroxy (Vit-D Deficiency, Fractures)    CANCELED: Comprehensive metabolic panel    CANCELED: CBC with Differential/Platelet    CANCELED: TSH    CANCELED: Vitamin B12    CANCELED: VITAMIN D 25 Hydroxy (Vit-D Deficiency, Fractures)    CANCELED: CBC with Differential/Platelet    CANCELED: Comprehensive metabolic panel    CANCELED: TSH    CANCELED: Vitamin B12    CANCELED: VITAMIN D 25 Hydroxy (Vit-D Deficiency, Fractures)      Meds ordered this encounter  Medications   omeprazole (PRILOSEC) 20 MG capsule    Sig: Take 1 capsule (20 mg total) by mouth daily.    Dispense:  30 capsule    Refill:  3   predniSONE (DELTASONE) 20 MG tablet    Sig:  Take 2 pills for 3 days, 1 pill for 4 days    Dispense:  10 tablet    Refill:  0    Time Spent: 45 minutes of total time (4:30 PM-5:15 PM) was spent on the date of the encounter performing the following actions: chart review prior to seeing the patient and well-seen patient, obtaining history, performing a medically necessary exam, counseling on the treatment plan and potential next steps, placing orders, and documenting in our EHR.    Return precautions advised.  Tana Conch, MD

## 2022-07-22 ENCOUNTER — Other Ambulatory Visit (INDEPENDENT_AMBULATORY_CARE_PROVIDER_SITE_OTHER): Payer: 59

## 2022-07-22 ENCOUNTER — Ambulatory Visit (INDEPENDENT_AMBULATORY_CARE_PROVIDER_SITE_OTHER)
Admission: RE | Admit: 2022-07-22 | Discharge: 2022-07-22 | Disposition: A | Discharge status: Home / Self Care | Payer: 59 | Source: Ambulatory Visit | Attending: Family Medicine | Admitting: Family Medicine

## 2022-07-22 DIAGNOSIS — R0989 Other specified symptoms and signs involving the circulatory and respiratory systems: Secondary | ICD-10-CM

## 2022-07-22 DIAGNOSIS — R5383 Other fatigue: Secondary | ICD-10-CM | POA: Diagnosis not present

## 2022-07-22 DIAGNOSIS — R053 Chronic cough: Secondary | ICD-10-CM

## 2022-07-22 LAB — CBC WITH DIFFERENTIAL/PLATELET
Basophils Absolute: 0.1 10*3/uL (ref 0.0–0.1)
Basophils Relative: 0.7 % (ref 0.0–3.0)
Eosinophils Absolute: 0.1 10*3/uL (ref 0.0–0.7)
Eosinophils Relative: 1.3 % (ref 0.0–5.0)
HCT: 45.9 % (ref 36.0–46.0)
Hemoglobin: 15.3 g/dL — ABNORMAL HIGH (ref 12.0–15.0)
Lymphocytes Relative: 10.2 % — ABNORMAL LOW (ref 12.0–46.0)
Lymphs Abs: 0.7 10*3/uL (ref 0.7–4.0)
MCHC: 33.4 g/dL (ref 30.0–36.0)
MCV: 97.3 fl (ref 78.0–100.0)
Monocytes Absolute: 0.2 10*3/uL (ref 0.1–1.0)
Monocytes Relative: 2.1 % — ABNORMAL LOW (ref 3.0–12.0)
Neutro Abs: 6.2 10*3/uL (ref 1.4–7.7)
Neutrophils Relative %: 85.7 % — ABNORMAL HIGH (ref 43.0–77.0)
Platelets: 241 10*3/uL (ref 150.0–400.0)
RBC: 4.72 Mil/uL (ref 3.87–5.11)
RDW: 12.8 % (ref 11.5–15.5)
WBC: 7.3 10*3/uL (ref 4.0–10.5)

## 2022-07-22 LAB — COMPREHENSIVE METABOLIC PANEL
ALT: 18 U/L (ref 0–35)
AST: 23 U/L (ref 0–37)
Albumin: 4.5 g/dL (ref 3.5–5.2)
Alkaline Phosphatase: 53 U/L (ref 39–117)
BUN: 17 mg/dL (ref 6–23)
CO2: 28 mEq/L (ref 19–32)
Calcium: 9.7 mg/dL (ref 8.4–10.5)
Chloride: 101 mEq/L (ref 96–112)
Creatinine, Ser: 0.96 mg/dL (ref 0.40–1.20)
GFR: 65.48 mL/min (ref >60.00)
Glucose, Bld: 101 mg/dL — ABNORMAL HIGH (ref 70–99)
Potassium: 4.3 mEq/L (ref 3.5–5.1)
Sodium: 135 mEq/L (ref 135–145)
Total Bilirubin: 0.8 mg/dL (ref 0.2–1.2)
Total Protein: 7.2 g/dL (ref 6.0–8.3)

## 2022-07-22 LAB — VITAMIN B12: Vitamin B-12: 696 pg/mL (ref 211–911)

## 2022-07-22 LAB — TSH: TSH: 1.98 u[IU]/mL (ref 0.35–5.50)

## 2022-07-22 LAB — VITAMIN D 25 HYDROXY (VIT D DEFICIENCY, FRACTURES): VITD: 54.43 ng/mL (ref 30.00–100.00)

## 2022-08-03 ENCOUNTER — Other Ambulatory Visit: Payer: Self-pay | Admitting: Obstetrics & Gynecology

## 2022-08-03 DIAGNOSIS — Z1231 Encounter for screening mammogram for malignant neoplasm of breast: Secondary | ICD-10-CM

## 2022-08-17 ENCOUNTER — Ambulatory Visit: Payer: 59 | Admitting: Family Medicine

## 2022-09-03 ENCOUNTER — Encounter: Payer: Self-pay | Admitting: Family Medicine

## 2022-09-03 ENCOUNTER — Encounter (INDEPENDENT_AMBULATORY_CARE_PROVIDER_SITE_OTHER): Payer: Self-pay

## 2022-09-03 NOTE — Progress Notes (Deleted)
   Kimberly Stevens, female    DOB: Dec 03, 1965   MRN: [id number]   Brief patient profile:  ***  *** referred to pulmonary clinic 09/04/2022 by *** for ***        History of Present Illness  09/04/2022  Pulmonary/ 1st office eval/Kimberly Stevens  No chief complaint on file.    Dyspnea:  *** Cough: *** Sleep: *** SABA use:     Outpatient Medications Prior to Visit  Medication Sig Dispense Refill   azelastine (ASTELIN) 0.1 % nasal spray Place 1 spray into both nostrils 2 (two) times daily.     montelukast (SINGULAIR) 10 MG tablet Take 10 mg by mouth at bedtime.     nitrofurantoin (MACRODANTIN) 100 MG capsule Takes 1 tablet after intercourse 30 capsule 1   omeprazole (PRILOSEC) 20 MG capsule Take 1 capsule (20 mg total) by mouth daily. 30 capsule 3   predniSONE (DELTASONE) 20 MG tablet Take 2 pills for 3 days, 1 pill for 4 days 10 tablet 0   No facility-administered medications prior to visit.    Past Medical History:  Diagnosis Date   Allergy    seasonal allergies   Arthritis    left great toe joint   Broken arm    right elbow & wrist   Colon polyps    benign   Depression    hx of post partum   Dermoid cyst of ovary    Eye disorder    possible glaucoma-being followed by Dr Kimberly Stevens   Fibrocystic breast    IBS (irritable bowel syndrome)    Plantar fasciitis    left foot 2/15-11/15   Polyp of duodenum 2014   tiny   Tendonitis    right knee 2/15-11/15   Torn rotator cuff    right      Objective:     LMP 06/20/2017 (Approximate)          Assessment   No problem-specific Assessment & Plan notes found for this encounter.     Sandrea Hughs, MD 09/03/2022

## 2022-09-04 ENCOUNTER — Institutional Professional Consult (permissible substitution): Payer: 59 | Admitting: Internal Medicine

## 2022-09-14 ENCOUNTER — Ambulatory Visit
Admission: RE | Admit: 2022-09-14 | Discharge: 2022-09-14 | Disposition: A | Discharge status: Home / Self Care | Payer: 59 | Source: Ambulatory Visit | Attending: Obstetrics & Gynecology | Admitting: Obstetrics & Gynecology

## 2022-09-14 DIAGNOSIS — Z1231 Encounter for screening mammogram for malignant neoplasm of breast: Secondary | ICD-10-CM

## 2022-09-30 ENCOUNTER — Encounter: Payer: Self-pay | Admitting: Family Medicine

## 2022-09-30 ENCOUNTER — Ambulatory Visit (INDEPENDENT_AMBULATORY_CARE_PROVIDER_SITE_OTHER): Payer: 59 | Admitting: Family Medicine

## 2022-09-30 VITALS — BP 110/78 | HR 60 | Temp 98.0°F | Wt 135.0 lb

## 2022-09-30 DIAGNOSIS — R0989 Other specified symptoms and signs involving the circulatory and respiratory systems: Secondary | ICD-10-CM | POA: Diagnosis not present

## 2022-09-30 DIAGNOSIS — J019 Acute sinusitis, unspecified: Secondary | ICD-10-CM | POA: Diagnosis not present

## 2022-09-30 LAB — POCT INFLUENZA A/B
Influenza A, POC: NEGATIVE
Influenza B, POC: NEGATIVE

## 2022-09-30 LAB — POC COVID19 BINAXNOW: SARS Coronavirus 2 Ag: NEGATIVE

## 2022-09-30 MED ORDER — HYDROCODONE BIT-HOMATROP MBR 5-1.5 MG/5ML PO SOLN: 5.0000 mL | ORAL | 0 refills | Status: DC | PRN

## 2022-09-30 MED ORDER — AZITHROMYCIN 250 MG PO TABS: ORAL_TABLET | ORAL | 0 refills | Status: DC

## 2022-09-30 MED ORDER — METHYLPREDNISOLONE 4 MG PO TBPK: ORAL_TABLET | ORAL | 0 refills | Status: DC

## 2022-09-30 NOTE — Progress Notes (Signed)
   Subjective:    Patient ID: Kimberly Stevens, female    DOB: 1965/09/12, 57 y.o.   MRN: [id number]  HPI Here for 3 days of sinus pressure, PND, ST, and coughing up yellow green sputum. No fever or SOB. Using Nyquil and Allegra with no relief. She had a plane trip back from [place] this past weekend , and she is flying to [place] for a biking trip this coming weekend.    Review of Systems  Constitutional: Negative.   HENT:  Positive for congestion, postnasal drip, sinus pressure and sore throat. Negative for ear pain.   Eyes: Negative.   Respiratory:  Positive for cough. Negative for shortness of breath and wheezing.        Objective:   Physical Exam Constitutional:      Appearance: Normal appearance.  HENT:     Right Ear: Tympanic membrane, ear canal and external ear normal.     Left Ear: Tympanic membrane, ear canal and external ear normal.     Nose: Nose normal.     Mouth/Throat:     Pharynx: Oropharynx is clear.  Eyes:     Conjunctiva/sclera: Conjunctivae normal.  Pulmonary:     Effort: Pulmonary effort is normal.     Breath sounds: Normal breath sounds.  Lymphadenopathy:     Cervical: No cervical adenopathy.  Neurological:     Mental Status: She is alert.           Assessment & Plan:  Sinusitis, treat with a Zpack and a Medrol dose pack. Use Hycodan as needed for the cough and stop the Nyquil.  Gershon Crane, MD

## 2022-09-30 NOTE — Addendum Note (Signed)
Addended by: Carola Rhine on: 09/30/2022 04:34 PM   Modules accepted: Orders

## 2022-10-02 ENCOUNTER — Ambulatory Visit: Payer: Managed Care, Other (non HMO) | Admitting: Family Medicine

## 2022-10-06 ENCOUNTER — Encounter: Payer: 59 | Admitting: Family Medicine

## 2022-10-06 IMAGING — CT CT CARDIAC CORONARY ARTERY CALCIUM SCORE
3 series · 14 of 20 positions shown, 16 images · non-contrast
Comparison: None.

Addendum:
CLINICAL DATA: Cardiovascular Disease Risk stratification

EXAM:
Coronary Calcium Score
TECHNIQUE: A gated, non-contrast computed tomography scan of the heart was
performed using 3mm slice thickness. Axial images were analyzed on a
dedicated workstation. Calcium scoring of the coronary arteries was
performed using the Agatston method.

[Series 2: cascseq 2.0 sa36 70% (id) · axial · 0.39mm/px · z∈[-253,-163]mm · 4 of 76 slices shown]
[im 16/76  vessel]
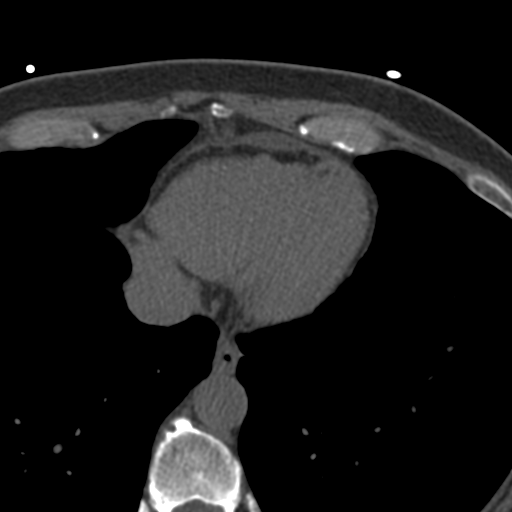
[im 31/76  vessel]
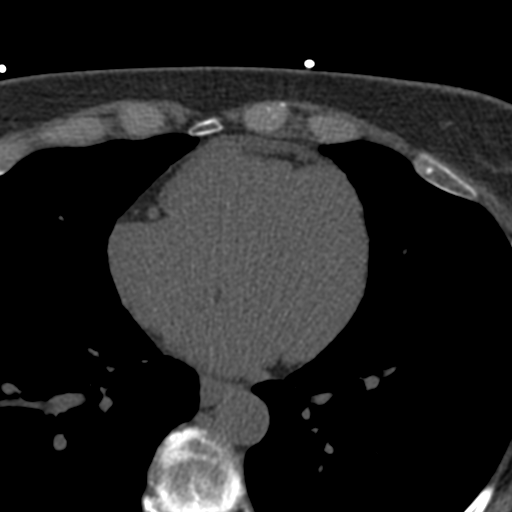
[im 46/76  vessel]
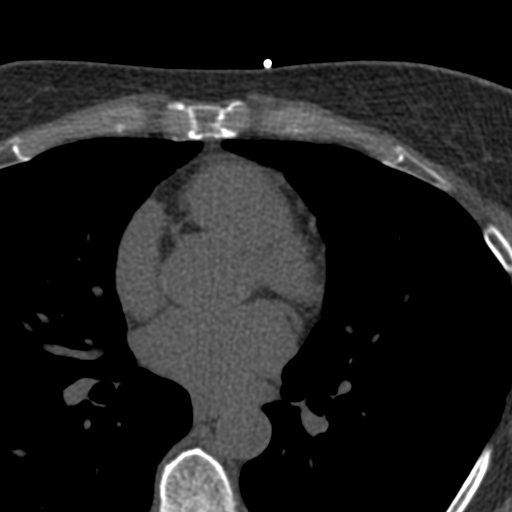
[im 61/76  vessel]
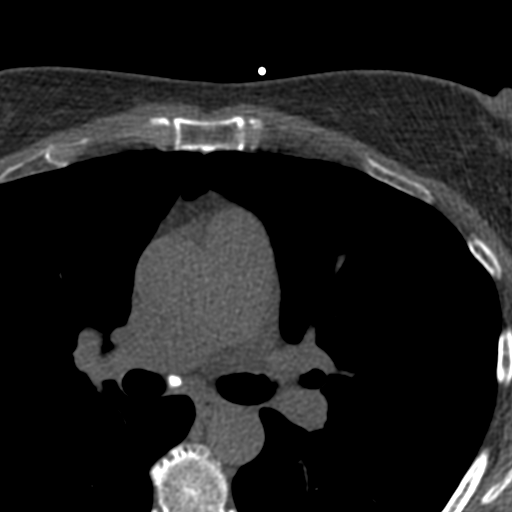

[Series 3: cascseq 2.0 bf37 st · axial · 0.70mm/px · z∈[-259,-159]mm · 5 of 76 slices shown, 7 images]
[im 13/76  vessel]
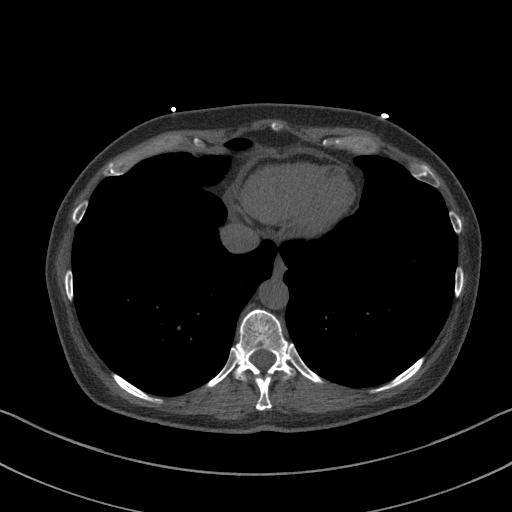
[im 13/76  lung]
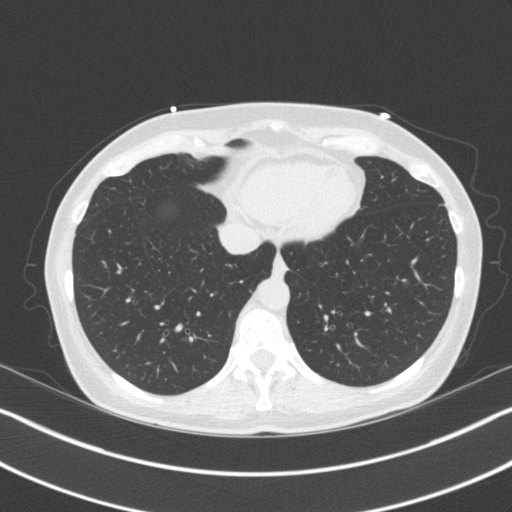
[im 26/76  vessel]
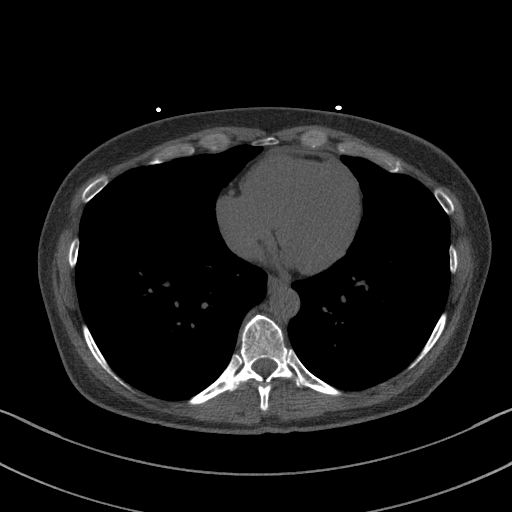
[im 38/76  vessel]
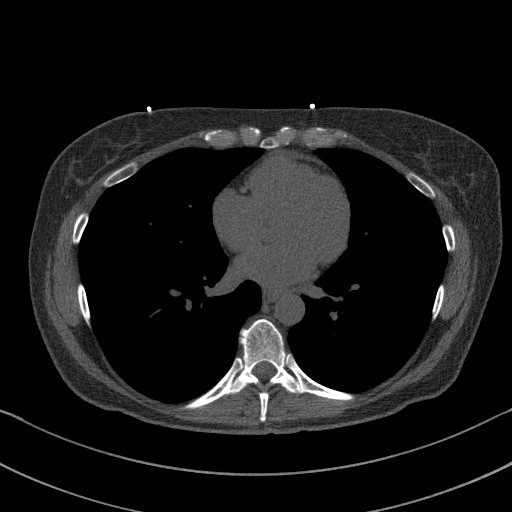
[im 51/76  vessel]
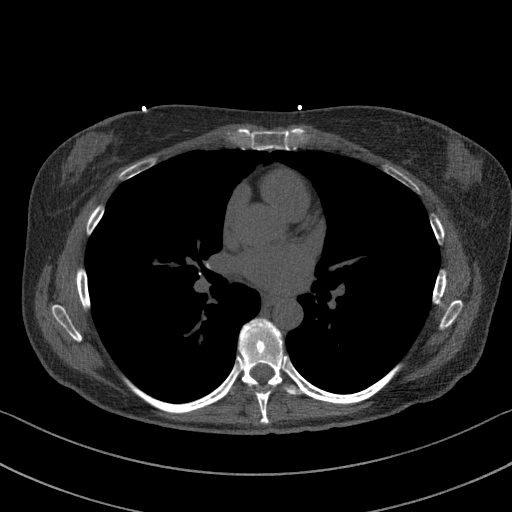
[im 63/76  vessel]
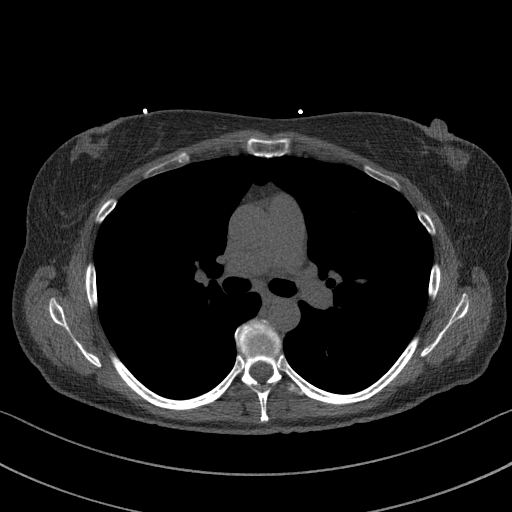
[im 63/76  lung]
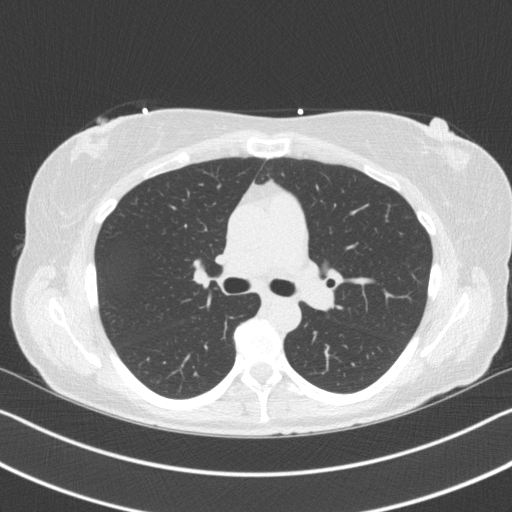

[Series 4: cascseq 2.0 br59 lung · axial · 0.70mm/px · z∈[-259,-159]mm · 5 of 76 slices shown]
[im 13/76  lung]
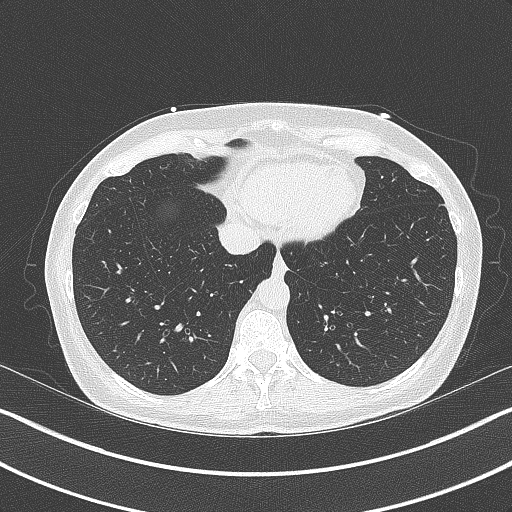
[im 26/76  lung]
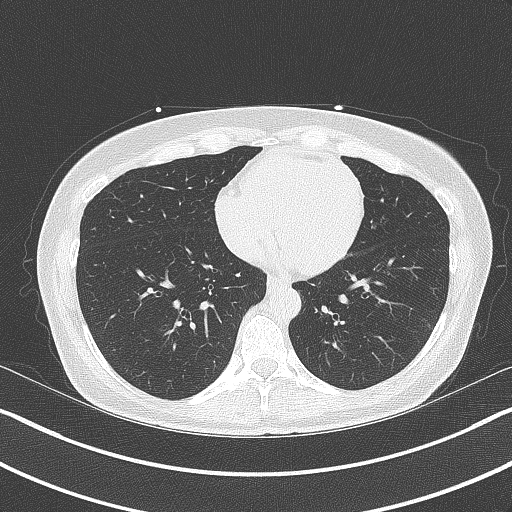
[im 38/76  lung]
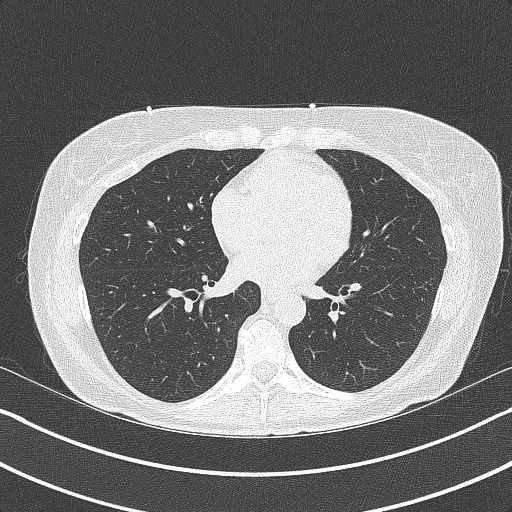
[im 51/76  lung]
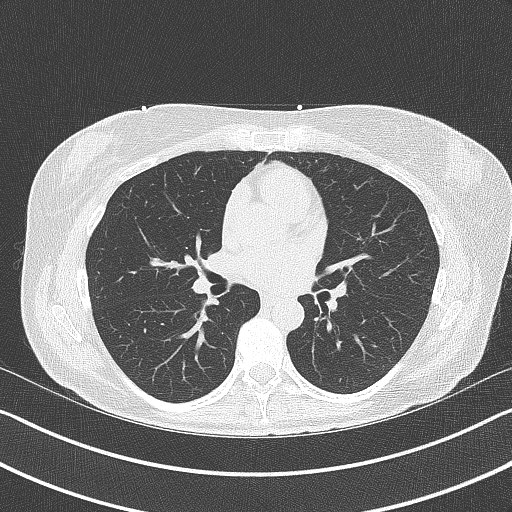
[im 63/76  lung]
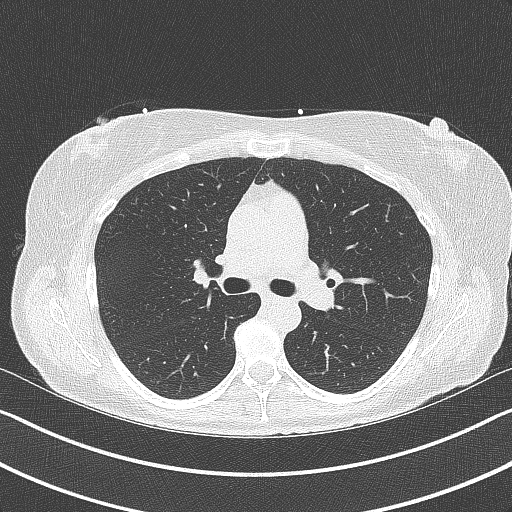

[14 of 20 positions shown; findings below may reference images not displayed]

FINDINGS: Coronary arteries: Normal origins.

Coronary Calcium Score:

Left main: 0

Left anterior descending artery: 0

Left circumflex artery: 0

Right coronary artery: 0

Total: 0

Percentile: 0

Pericardium: Normal.

Ascending Aorta: Normal caliber.

Non-cardiac: See separate report from [REDACTED].
IMPRESSION: Coronary calcium score of 0. This was 0 percentile for age-, race-,
and sex-matched controls.



If CAC=0, it is reasonable to withhold statin therapy and reassess
in 5 to 10 years, as long as higher risk conditions are absent
(diabetes mellitus, family history of premature CHD in first degree
relatives (males <55 years; females <65 years), cigarette smoking,
or LDL >=190 mg/dL).

If CAC is 1 to 99, it is reasonable to initiate statin therapy for
patients >=55 years of age.

If CAC is >=100 or >=75th percentile, it is reasonable to initiate
statin therapy at any age.

Cardiology referral should be considered for patients with CAC
scores >=400 or >=75th percentile.

*0472 AHA/ACC/AACVPR/AAPA/ABC/QUIRIJN/BHAJAN/FRANKY/Danii/BANH/PARGENEL/HERIBERTA
Guideline on the Management of Blood Cholesterol: A Report of the
American College of Cardiology/American Heart Association Task Force
on Clinical Practice Guidelines. J Am Coll Cardiol.
4202;73(24):7374-7786.

EXAM:
OVER-READ INTERPRETATION  CT CHEST

The following report is an over-read performed by radiologist Dr.
does not include interpretation of cardiac or coronary anatomy or
pathology. The coronary calcium score interpretation by the
cardiologist is attached.
FINDINGS: Vascular: No acute findings

Mediastinum/nodes: No adenopathy or mass. Calcified subcarinal,
right paratracheal and right hilar lymph nodes compatible with
remote granulomatous disease.

Lungs/pleura: No pleural effusion, airspace consolidation, or
pneumothorax. Calcified granuloma identified within the
posterolateral right middle lobe. No suspicious lung nodules.

Upper abdomen: No acute abnormality within the imaged portions of
the upper abdomen.

Musculoskeletal: No acute or suspicious osseous findings.
IMPRESSION: 1. No active cardiopulmonary abnormalities.
2. Prior granulomatous disease.

*** End of Addendum ***
FINDINGS: Coronary arteries: Normal origins.

Coronary Calcium Score:

Left main: 0

Left anterior descending artery: 0

Left circumflex artery: 0

Right coronary artery: 0

Total: 0

Percentile: 0

Pericardium: Normal.

Ascending Aorta: Normal caliber.

Non-cardiac: See separate report from [REDACTED].
IMPRESSION: Coronary calcium score of 0. This was 0 percentile for age-, race-,
and sex-matched controls.



If CAC=0, it is reasonable to withhold statin therapy and reassess
in 5 to 10 years, as long as higher risk conditions are absent
(diabetes mellitus, family history of premature CHD in first degree
relatives (males <55 years; females <65 years), cigarette smoking,
or LDL >=190 mg/dL).

If CAC is 1 to 99, it is reasonable to initiate statin therapy for
patients >=55 years of age.

If CAC is >=100 or >=75th percentile, it is reasonable to initiate
statin therapy at any age.

Cardiology referral should be considered for patients with CAC
scores >=400 or >=75th percentile.

*0472 AHA/ACC/AACVPR/AAPA/ABC/QUIRIJN/BHAJAN/FRANKY/Danii/BANH/PARGENEL/HERIBERTA
Guideline on the Management of Blood Cholesterol: A Report of the
American College of Cardiology/American Heart Association Task Force
on Clinical Practice Guidelines. J Am Coll Cardiol.
4202;73(24):7374-7786.

## 2022-10-12 ENCOUNTER — Encounter: Payer: Self-pay | Admitting: Family Medicine

## 2022-10-12 DIAGNOSIS — R0981 Nasal congestion: Secondary | ICD-10-CM

## 2022-10-12 DIAGNOSIS — J3489 Other specified disorders of nose and nasal sinuses: Secondary | ICD-10-CM

## 2022-10-12 DIAGNOSIS — R053 Chronic cough: Secondary | ICD-10-CM

## 2022-10-16 ENCOUNTER — Ambulatory Visit (INDEPENDENT_AMBULATORY_CARE_PROVIDER_SITE_OTHER): Payer: Managed Care, Other (non HMO) | Admitting: Family Medicine

## 2022-10-16 ENCOUNTER — Ambulatory Visit: Payer: Managed Care, Other (non HMO) | Admitting: Family Medicine

## 2022-10-16 ENCOUNTER — Encounter: Payer: Self-pay | Admitting: Family Medicine

## 2022-10-16 VITALS — BP 110/74 | HR 61 | Temp 97.8°F | Wt 131.0 lb

## 2022-10-16 DIAGNOSIS — J0191 Acute recurrent sinusitis, unspecified: Secondary | ICD-10-CM | POA: Diagnosis not present

## 2022-10-16 MED ORDER — AMOXICILLIN-POT CLAVULANATE 875-125 MG PO TABS: 1.0000 | ORAL_TABLET | Freq: Two times a day (BID) | ORAL | 0 refills | Status: DC

## 2022-10-16 NOTE — Progress Notes (Unsigned)
Kimberly Stevens, female    DOB: 07-03-1965   MRN: [id number]   Brief patient profile:  70  yowf never smoker no problem with resp moved to [facility] 1999 with onset of rhinitis moved [place]  in 2013 with nasal congestion  rx astelin and singulair much worse nasal symptoms even worse p blephoropalsty in Feb 2022 and cough every since so referred  10/19/2022 by Dr Durene Cal  for cough with sensation of pnds pnds         History of Present Illness  10/19/2022  Pulmonary/ 1st office eval/Claretta Kendra  day #3 of augmentin  Chief Complaint  Patient presents with   Consult    Cough (2 years ) productive green. Pt is on a antibiotic, has tried an albuterol inhaler. Did not find relief.    Dyspnea:  aerobic ex no  Cough: woken up like arm clock better p uses Hydrocone at hs  - worse when lie down q hs and when wakes up > green chunks / worse with laughter Sleep: flat bed  2 pillows  SABA use: tried saba didn't help/ ? Used DPI's that made it worse. (She can't remember name)  Pred helped the cough the most   No obvious day to day or daytime pattern/variability or assoc definite  mucus plugs or hemoptysis or cp or chest tightness, subjective wheeze or overt hb symptoms.    Also denies any obvious fluctuation of symptoms with weather or environmental changes or other aggravating or alleviating factors except as outlined above   No unusual exposure hx or h/o childhood pna/ asthma or knowledge of premature birth.  Current Allergies, Complete Past Medical History, Past Surgical History, Family History, and Social History were reviewed in [employer] record.  ROS  The following are not active complaints unless bolded Hoarseness, sore throat, dysphagia, dental problems, itching, sneezing,  nasal congestion or discharge of excess mucus or purulent secretions, ear ache,   fever, chills, sweats, unintended wt loss or wt gain, classically pleuritic or  exertional cp,  orthopnea pnd or arm/hand swelling  or leg swelling, presyncope, palpitations, abdominal pain, anorexia, nausea, vomiting, diarrhea  or change in bowel habits or change in bladder habits, change in stools or change in urine, dysuria, hematuria,  rash, arthralgias, visual complaints, headache, numbness, weakness or ataxia or problems with walking or coordination,  change in mood or  memory.                 Outpatient Medications Prior to Visit  Medication Sig Dispense Refill   amoxicillin-clavulanate (AUGMENTIN) 875-125 MG tablet Take 1 tablet by mouth 2 (two) times daily. 28 tablet 0   Fexofenadine HCl (ALLEGRA PO) Take by mouth.     HYDROcodone bit-homatropine (HYCODAN) 5-1.5 MG/5ML syrup Take 5 mLs by mouth every 4 (four) hours as needed. 240 mL 0   No facility-administered medications prior to visit.    Past Medical History:  Diagnosis Date   Allergy    seasonal allergies   Arthritis    left great toe joint   Broken arm    right elbow & wrist   Colon polyps    benign   Depression    hx of post partum   Dermoid cyst of ovary    Eye disorder    possible glaucoma-being followed by Dr Deatra James   Fibrocystic breast    IBS (irritable bowel syndrome)    Plantar fasciitis    left  foot 2/15-11/15   Polyp of duodenum 2014   tiny   Tendonitis    right knee 2/15-11/15   Torn rotator cuff    right      Objective:     BP 118/68   Pulse 64   Ht 5\' 4"  (1.626 m)   Wt 132 lb 3.2 oz (60 kg)   LMP 06/20/2017 (Approximate)   SpO2 97%   BMI 22.69 kg/m   SpO2: 97 % RA  Pleasant amb wf nad   HEENT : Oropharynx  clear      Nasal turbinates mild edema/ no def purulent secretions or polyps / wax R > L s cough on insertion of otiscope   NECK :  without  apparent JVD/ palpable Nodes/TM    LUNGS: no acc muscle use,  Nl contour chest with a few scatterd squeaks on  insp and cough at end exp maneuvers   CV:  RRR  no s3 or murmur or increase in P2, and no edema    ABD:  soft and nontender with nl inspiratory excursion in the supine position. No bruits or organomegaly appreciated   MS:  Nl gait/ ext warm without deformities Or obvious joint restrictions  calf tenderness, cyanosis or clubbing    SKIN: warm and dry without lesions    NEURO:  alert, approp, nl sensorium with  no motor or cerebellar deficits apparent.   Insp exp/ end of out   CXR PA and Lateral:   10/19/2022 :    I personally reviewed images and impression is as follows:     Wnl     Assessment   Cough variant asthma Onset ws Feb 2022 on a background  of chronic rhinitis  - Augmentin rx 10/17/22  x 2 weeks  - Allergy screen 10/19/2022 >  Eos 0.5 /  IgE   - 10/19/2022  After extensive coaching inhaler device,  effectiveness =    75% from a baseline of 25% > Air surpa trial plus pred x 6 days/ max gerd rx and 4 week f/u  - 10/20/2022 sinus CT >>>  Suspect combination of AB and UACS related to chronic rhinitis with element of each in her hx  Upper airway cough syndrome (previously labeled PNDS),  is so named because it's frequently impossible to sort out how much is  CR/sinusitis with freq throat clearing (which can be related to primary GERD)   vs  causing  secondary (" extra esophageal")  GERD from wide swings in gastric pressure that occur with throat clearing, often  promoting self use of mint and menthol lozenges that reduce the lower esophageal sphincter tone and exacerbate the problem further in a cyclical fashion.   These are the same pts (now being labeled as having "irritable larynx syndrome" by some cough centers) who not infrequently have a history of having failed to tolerate ace inhibitors,  dry powder inhalers or biphosphonates or report having atypical/extraesophageal reflux symptoms that don't respond to standard doses of PPI  and are easily confused as having aecopd or asthma flares by even experienced allergists/ pulmonologists (myself included).    Rec Rx as above   Avoid DPI's in this setting if favor of AirSupra with hfa to handle the AB component without triggering the uacs.  F/u 4 weeks with all meds in hand using a trust but verify approach to confirm accurate Medication  Reconciliation The principal here is that until we are certain that the  patients are doing what we've asked,  it makes no sense to ask them to do more.          Each maintenance medication was reviewed in detail including emphasizing most importantly the difference between maintenance and prns and under what circumstances the prns are to be triggered using an action plan format where appropriate.  Total time for H and P, chart review, counseling, reviewing hfa device(s) and generating customized AVS unique to this office visit / same day charting > 45 min new pt eval          Sandrea Hughs, MD 10/19/2022

## 2022-10-16 NOTE — Progress Notes (Signed)
   Subjective:    Patient ID: Kimberly Stevens, female    DOB: 22-Sep-1965, 57 y.o.   MRN: [id number]  HPI Here for recurrent sinus infections. We saw her on 09-30-22 and we treated her with a Zpack and a Medrol dose pack. This helped for awhile, but now the congestion is back and she is coughing up green sputum. No fever or SOB. She is scheduled for a maxillofacial CT on 10-21-22, and she has an upcoming ENT appt.    Review of Systems  Constitutional: Negative.   HENT:  Positive for congestion, postnasal drip and sinus pressure. Negative for ear pain and sore throat.   Eyes: Negative.   Respiratory:  Positive for cough. Negative for shortness of breath and wheezing.        Objective:   Physical Exam Constitutional:      Appearance: Normal appearance. She is not ill-appearing.  HENT:     Right Ear: Tympanic membrane, ear canal and external ear normal.     Left Ear: Tympanic membrane, ear canal and external ear normal.     Nose: Nose normal.     Mouth/Throat:     Pharynx: Oropharynx is clear.  Eyes:     Conjunctiva/sclera: Conjunctivae normal.  Pulmonary:     Effort: Pulmonary effort is normal.     Breath sounds: No rhonchi or rales.     Comments: Soft expiratory wheezes on the left side  Lymphadenopathy:     Cervical: No cervical adenopathy.  Neurological:     Mental Status: She is alert.           Assessment & Plan:  Recurrent sinusitis. We will treat with 14 days of Augmentin.  Gershon Crane, MD

## 2022-10-19 ENCOUNTER — Institutional Professional Consult (permissible substitution): Payer: Managed Care, Other (non HMO) | Admitting: Internal Medicine

## 2022-10-19 ENCOUNTER — Ambulatory Visit: Payer: Managed Care, Other (non HMO)

## 2022-10-19 ENCOUNTER — Encounter: Payer: Self-pay | Admitting: Internal Medicine

## 2022-10-19 ENCOUNTER — Ambulatory Visit (INDEPENDENT_AMBULATORY_CARE_PROVIDER_SITE_OTHER): Payer: Managed Care, Other (non HMO) | Admitting: Internal Medicine

## 2022-10-19 ENCOUNTER — Other Ambulatory Visit (INDEPENDENT_AMBULATORY_CARE_PROVIDER_SITE_OTHER): Payer: Managed Care, Other (non HMO)

## 2022-10-19 VITALS — BP 118/68 | HR 64 | Ht 64.0 in | Wt 132.2 lb

## 2022-10-19 DIAGNOSIS — J45991 Cough variant asthma: Secondary | ICD-10-CM

## 2022-10-19 LAB — CBC WITH DIFFERENTIAL/PLATELET
Basophils Absolute: 0.1 10*3/uL (ref 0.0–0.1)
Basophils Relative: 2.7 % (ref 0.0–3.0)
Eosinophils Absolute: 0.5 10*3/uL (ref 0.0–0.7)
Eosinophils Relative: 10.2 % — ABNORMAL HIGH (ref 0.0–5.0)
HCT: 46.3 % — ABNORMAL HIGH (ref 36.0–46.0)
Hemoglobin: 15.5 g/dL — ABNORMAL HIGH (ref 12.0–15.0)
Lymphocytes Relative: 29.5 % (ref 12.0–46.0)
Lymphs Abs: 1.3 10*3/uL (ref 0.7–4.0)
MCHC: 33.4 g/dL (ref 30.0–36.0)
MCV: 97.4 fL (ref 78.0–100.0)
Monocytes Absolute: 0.4 10*3/uL (ref 0.1–1.0)
Monocytes Relative: 8 % (ref 3.0–12.0)
Neutro Abs: 2.2 10*3/uL (ref 1.4–7.7)
Neutrophils Relative %: 49.6 % (ref 43.0–77.0)
Platelets: 224 10*3/uL (ref 150.0–400.0)
RBC: 4.76 Mil/uL (ref 3.87–5.11)
RDW: 12.3 % (ref 11.5–15.5)
WBC: 4.5 10*3/uL (ref 4.0–10.5)

## 2022-10-19 MED ORDER — PREDNISONE 10 MG PO TABS
ORAL_TABLET | ORAL | 0 refills | Status: DC
Start: 2022-10-19 — End: 2022-12-25

## 2022-10-19 MED ORDER — AIRSUPRA 90-80 MCG/ACT IN AERO: 2.0000 | INHALATION_SPRAY | RESPIRATORY_TRACT | 0 refills | Status: DC | PRN

## 2022-10-19 NOTE — Patient Instructions (Addendum)
Please remember to go to the lab department   for your tests - we will call you with the results when they are available.     Please remember to go to the  x-ray department  for your tests - we will call you with the results when they are available    For cough/ wheeze/ rattle >   AirSupra  up to 2 pffs every 4 hours if needed    Prednisone 10 mg take  4 each am x 2 days,   2 each am x 2 days,  1 each am x 2 days and stop   Try prilosec otc 20mg   Take 30-60 min before first meal of the day and Pepcid ac (famotidine) 20 mg one hour before bedtime until cough is completely gone for at least a week without the need for cough suppression  GERD (REFLUX)  is an extremely common cause of respiratory symptoms just like yours , many times with no obvious heartburn at all.    It can be treated with medication, but also with lifestyle changes including elevation of the head of your bed (ideally with 6 -8inch blocks under the headboard of your bed),  Smoking cessation, avoidance of late meals, excessive alcohol, and avoid fatty foods, chocolate, peppermint, colas, red wine, and acidic juices such as orange juice.  NO MINT OR MENTHOL PRODUCTS SO NO COUGH DROPS  USE SUGARLESS CANDY INSTEAD (Jolley ranchers or Stover's or Life Savers) or even ice chips will also do - the key is to swallow to prevent all throat clearing. NO OIL BASED VITAMINS - use powdered substitutes.  Avoid fish oil when coughing.    Please schedule a follow up office visit in 4 weeks, sooner if needed  with all medications /inhalers/ solutions in hand so we can verify exactly what you are taking. This includes all medications from all doctors and over the counters

## 2022-10-20 LAB — IGE: IgE (Immunoglobulin E), Serum: 8 kU/L (ref <=114)

## 2022-10-20 NOTE — Assessment & Plan Note (Addendum)
Onset ws Feb 2022 on a background  of chronic rhinitis  - Augmentin rx 10/17/22  x 2 weeks  - Allergy screen 10/19/2022 >  Eos 0.5 /  IgE   - 10/19/2022  After extensive coaching inhaler device,  effectiveness =    75% from a baseline of 25% > Air surpa trial plus pred x 6 days/ max gerd rx and 4 week f/u  - 10/20/2022 sinus CT >>>  Suspect combination of AB and UACS related to chronic rhinitis with element of each in her hx  Upper airway cough syndrome (previously labeled PNDS),  is so named because it's frequently impossible to sort out how much is  CR/sinusitis with freq throat clearing (which can be related to primary GERD)   vs  causing  secondary (" extra esophageal")  GERD from wide swings in gastric pressure that occur with throat clearing, often  promoting self use of mint and menthol lozenges that reduce the lower esophageal sphincter tone and exacerbate the problem further in a cyclical fashion.   These are the same pts (now being labeled as having "irritable larynx syndrome" by some cough centers) who not infrequently have a history of having failed to tolerate ace inhibitors,  dry powder inhalers or biphosphonates or report having atypical/extraesophageal reflux symptoms that don't respond to standard doses of PPI  and are easily confused as having aecopd or asthma flares by even experienced allergists/ pulmonologists (myself included).    Rec Rx as above  Avoid DPI's in this setting if favor of AirSupra with hfa to handle the AB component without triggering the uacs.  F/u 4 weeks with all meds in hand using a trust but verify approach to confirm accurate Medication  Reconciliation The principal here is that until we are certain that the  patients are doing what we've asked, it makes no sense to ask them to do more.          Each maintenance medication was reviewed in detail including emphasizing most importantly the difference between maintenance and prns and under what circumstances  the prns are to be triggered using an action plan format where appropriate.  Total time for H and P, chart review, counseling, reviewing hfa device(s) and generating customized AVS unique to this office visit / same day charting > 45 min new pt eval

## 2022-10-21 ENCOUNTER — Inpatient Hospital Stay
Admission: RE | Admit: 2022-10-21 | Discharge: 2022-10-21 | Discharge status: Home / Self Care | Payer: Managed Care, Other (non HMO) | Source: Ambulatory Visit | Attending: Family Medicine | Admitting: Family Medicine

## 2022-10-21 DIAGNOSIS — R0981 Nasal congestion: Secondary | ICD-10-CM

## 2022-10-21 DIAGNOSIS — J3489 Other specified disorders of nose and nasal sinuses: Secondary | ICD-10-CM

## 2022-10-21 DIAGNOSIS — R053 Chronic cough: Secondary | ICD-10-CM

## 2022-10-21 MED ORDER — IOPAMIDOL (ISOVUE-300) INJECTION 61%
200.0000 mL | Freq: Once | INTRAVENOUS | Status: AC | PRN
  Administered 2022-10-21: 69 mL via INTRAVENOUS

## 2022-10-22 ENCOUNTER — Encounter: Payer: Self-pay | Admitting: Internal Medicine

## 2022-10-22 ENCOUNTER — Encounter: Payer: Self-pay | Admitting: Family Medicine

## 2022-10-29 NOTE — Telephone Encounter (Signed)
Called and spoke with [facility] and they will put in a request to have the results read sooner, everything is still backed up on reads with radiologist shortage.

## 2022-11-02 ENCOUNTER — Ambulatory Visit (INDEPENDENT_AMBULATORY_CARE_PROVIDER_SITE_OTHER): Payer: Managed Care, Other (non HMO) | Admitting: Otolaryngology

## 2022-11-02 ENCOUNTER — Encounter: Payer: Self-pay | Admitting: Family Medicine

## 2022-11-02 ENCOUNTER — Other Ambulatory Visit: Payer: Self-pay

## 2022-11-02 DIAGNOSIS — J0191 Acute recurrent sinusitis, unspecified: Secondary | ICD-10-CM

## 2022-11-03 ENCOUNTER — Ambulatory Visit (INDEPENDENT_AMBULATORY_CARE_PROVIDER_SITE_OTHER): Payer: Managed Care, Other (non HMO) | Admitting: Sports Medicine

## 2022-11-03 ENCOUNTER — Other Ambulatory Visit: Payer: Self-pay

## 2022-11-03 VITALS — BP 119/81 | Ht 62.75 in | Wt 131.0 lb

## 2022-11-03 DIAGNOSIS — S76301D Unspecified injury of muscle, fascia and tendon of the posterior muscle group at thigh level, right thigh, subsequent encounter: Secondary | ICD-10-CM | POA: Diagnosis not present

## 2022-11-03 DIAGNOSIS — G5701 Lesion of sciatic nerve, right lower limb: Secondary | ICD-10-CM

## 2022-11-03 DIAGNOSIS — M25551 Pain in right hip: Secondary | ICD-10-CM

## 2022-11-03 NOTE — Assessment & Plan Note (Signed)
I think the patient has challenges with her right hip abductor shutting down and not firing effectively as she is tends to rely heavily on her quads and hamstrings  Exercise program at home emphasize hip abduction Piriformis stretches and strengthening exercises  Referral to [facility] for physical therapy to strengthen these areas I think she might be a candidate for Pilates to help maintain her hip core strength over the long-term

## 2022-11-03 NOTE — Progress Notes (Signed)
Chief complaint right hip and buttocks pain  Patient is active and walking pickleball and golf Recently she has been having pain over her right lower buttocks down almost to her hamstring The pain does not bother her with walking However too much standing and the pain will start To think she feels that with golf After playing pickle ball where she does a lot of running side-to-side she also feels this pain  She has a past history of some hip abduction weakness  Physical exam Athletic white female in no acute distress BP 119/81   Ht 5' 2.75" (1.594 m)   Wt 131 lb (59.4 kg)   LMP 06/20/2017 (Approximate)   BMI 23.39 kg/m   Hip shows a full range of motion Right hip abduction is weak Right piriformis and hip rotation testing is weak She has tenderness with palpation over the piriformis belly about a third of the distance between the greater trochanter and the sacrum Greater trochanter is slightly tender No tenderness over the gluteus medius belly Hamstring testing is strong and is unremarkable Hip flexion and quadriceps testing is strong and unremarkable

## 2022-11-12 ENCOUNTER — Encounter: Payer: Self-pay | Admitting: Sports Medicine

## 2022-11-17 ENCOUNTER — Encounter (INDEPENDENT_AMBULATORY_CARE_PROVIDER_SITE_OTHER): Payer: Self-pay

## 2022-11-17 ENCOUNTER — Ambulatory Visit (INDEPENDENT_AMBULATORY_CARE_PROVIDER_SITE_OTHER): Payer: Managed Care, Other (non HMO) | Admitting: Otolaryngology

## 2022-11-17 VITALS — Ht 63.0 in | Wt 132.0 lb

## 2022-11-17 DIAGNOSIS — J342 Deviated nasal septum: Secondary | ICD-10-CM | POA: Diagnosis not present

## 2022-11-17 DIAGNOSIS — J338 Other polyp of sinus: Secondary | ICD-10-CM

## 2022-11-17 DIAGNOSIS — J3489 Other specified disorders of nose and nasal sinuses: Secondary | ICD-10-CM

## 2022-11-17 DIAGNOSIS — J320 Chronic maxillary sinusitis: Secondary | ICD-10-CM | POA: Diagnosis not present

## 2022-11-17 DIAGNOSIS — J343 Hypertrophy of nasal turbinates: Secondary | ICD-10-CM

## 2022-11-17 NOTE — Progress Notes (Unsigned)
Patient ID: Kimberly Stevens, female   DOB: May 08, 1965, 57 y.o.   MRN: [id number]  CC: Chronic rhinosinusitis, chronic nasal obstruction  HPI: The patient is a 57 year old female who presents today complaining of chronic rhinosinusitis and chronic nasal obstruction.  She has been symptomatic for more than 2 years.  She was recently treated with 3 courses of antibiotics. She is also on chronic Flonase, azelastine, Singulair, and Allegra.  Her CT scan last week shows bilateral chronic maxillary sinusitis, nasal septal deviation, bilateral inferior turbinate hypertrophy, and bilateral concha bullosa.  The patient also has a history of gastroesophageal reflux.  She is currently on omeprazole and Pepcid.  She has no previous ENT surgery.  Past Medical History:  Diagnosis Date   Allergy    seasonal allergies   Arthritis    left great toe joint   Broken arm    right elbow & wrist   Colon polyps    benign   Depression    hx of post partum   Dermoid cyst of ovary    Eye disorder    possible glaucoma-being followed by Dr Deatra James   Fibrocystic breast    IBS (irritable bowel syndrome)    Plantar fasciitis    left foot 2/15-11/15   Polyp of duodenum 2014   tiny   Tendonitis    right knee 2/15-11/15   Torn rotator cuff    right    Past Surgical History:  Procedure Laterality Date   BROW LIFT Bilateral 02/22/2020   Procedure: BLEPHAROPLASTY UPPER EYELID; W/EXCESS SKIN BILATERAL;  Surgeon: Imagene Riches, MD;  Location: [facility];  Service: Ophthalmology;  Laterality: Bilateral;   COLONOSCOPY  2014   JP-MAC-movi(exc)-poylpoid   POLYPECTOMY  2014   polypoid   WISDOM TOOTH EXTRACTION      Family History  Problem Relation Age of Onset   Arthritis Paternal Grandmother    Arthritis Maternal Grandmother    Heart attack Maternal Grandmother        age 6   Multiple myeloma Father        63 in 2022. around mid 60s.   Diverticulitis Father    Hearing loss Father        and poor  vision   Hepatitis Father        B she believes   Macular degeneration Father    Leukemia Father    Conductive hearing loss Father    Arthritis Mother        1 in 2022. shoulder, knee surgery.   Healthy Sister    Healthy Sister    Leukemia Maternal Uncle    Colon cancer Neg Hx    Colon polyps Neg Hx    Liver disease Neg Hx    Pancreatic cancer Neg Hx    Esophageal cancer Neg Hx    Stomach cancer Neg Hx    Rectal cancer Neg Hx     Social History:  reports that she has never smoked. She has never used smokeless tobacco. She reports current alcohol use of about 4.0 standard drinks of alcohol per week. She reports that she does not use drugs.  Allergies:  Allergies  Allergen Reactions   Gluten Meal Diarrhea   Lactose Intolerance (Gi)     Prior to Admission medications   Medication Sig Start Date End Date Taking? Authorizing Provider  famotidine (PEPCID) 20 MG tablet Take 20 mg by mouth 2 (two) times daily.   Yes [provider]  omeprazole (PRILOSEC OTC) 20  MG tablet Take 20 mg by mouth.   Yes [provider]  Albuterol-Budesonide (AIRSUPRA) 90-80 MCG/ACT AERO Inhale 2 puffs into the lungs every 4 (four) hours as needed. 10/19/22   Nyoka Cowden, MD  amoxicillin-clavulanate (AUGMENTIN) 875-125 MG tablet Take 1 tablet by mouth 2 (two) times daily. 10/16/22   Nelwyn Salisbury, MD  Fexofenadine HCl (ALLEGRA PO) Take by mouth.    [provider]  HYDROcodone bit-homatropine (HYCODAN) 5-1.5 MG/5ML syrup Take 5 mLs by mouth every 4 (four) hours as needed. 09/30/22   Nelwyn Salisbury, MD  predniSONE (DELTASONE) 10 MG tablet Take  4 each am x 2 days,   2 each am x 2 days,  1 each am x 2 days and stop 10/19/22   Nyoka Cowden, MD    Height 5\' 3"  (1.6 m), weight 132 lb (59.9 kg), last menstrual period 06/20/2017. Exam: General: Communicates without difficulty, well nourished, no acute distress. Head: Normocephalic, no evidence injury, no tenderness, facial  buttresses intact without stepoff. Face/sinus: No tenderness to palpation and percussion. Facial movement is normal and symmetric. Eyes: PERRL, EOMI. No scleral icterus, conjunctivae clear. Neuro: CN II exam reveals vision grossly intact.  No nystagmus at any point of gaze. Ears: Auricles well formed without lesions.  Ear canals are intact without mass or lesion.  No erythema or edema is appreciated.  The TMs are intact without fluid. Nose: External evaluation reveals normal support and skin without lesions.  Dorsum is intact.  Anterior rhinoscopy reveals congested mucosa over anterior aspect of inferior turbinates and intact septum.   Oral:  Oral cavity and oropharynx are intact, symmetric, without erythema or edema.  Mucosa is moist without lesions. Neck: Full range of motion without pain.  There is no significant lymphadenopathy.  No masses palpable.  Thyroid bed within normal limits to palpation.  Parotid glands and submandibular glands equal bilaterally without mass.  Trachea is midline. Neuro:  CN 2-12 grossly intact.    Procedure:  Flexible Nasal Endoscopy: Description: Risks, benefits, and alternatives of flexible endoscopy were explained to the patient.  Specific mention was made of the risk of throat numbness with difficulty swallowing, possible bleeding from the nose and mouth, and pain from the procedure.  The patient gave oral consent to proceed.  The flexible scope was inserted into the right nasal cavity.  Endoscopy of the interior nasal cavity, superior, inferior, and middle meatus was performed. The sphenoid-ethmoid recess was examined. Edematous mucosa was noted.  Polypoid tissue was noted to obstruct the middle meatus.  Nasal septal deviation noted. Olfactory cleft was clear.  Nasopharynx was clear.  Turbinates were hypertrophied but without mass.  The procedure was repeated on the contralateral side with similar findings.  The patient tolerated the procedure well.    Assessment: 1.   Bilateral chronic maxillary sinusitis, with polypoid tissue obstructing the middle meatus bilaterally. 2.  Diffuse nasal mucosal congestion, nasal septal deviation, bilateral inferior turbinate hypertrophy, and bilateral concha bullosa.  More than 95% of her nasal passageways are obstructed bilaterally.  Plan: 1.  The physical exam and nasal endoscopy findings are reviewed with the patient. 2.  The CT images are also reviewed with the patient. 3.  Continue with her daily allergy treatment regimen. 4.  In light of her persistent symptoms, she may benefit from surgical intervention with bilateral maxillary antrostomy with polyp removal, septoplasty, bilateral turbinate reduction, and bilateral endoscopic concha bullosa resection.  The risk, benefits, alternatives, and details of the surgical  procedures are extensively discussed.  Questions are invited and answered. 5.  The patient would like to proceed with the procedures.  We will schedule the procedures in accordance with the patient's schedule.  Kenrick Pore W Laquinton Bihm 11/19/2022, 10:00 AM

## 2022-11-18 DIAGNOSIS — J3489 Other specified disorders of nose and nasal sinuses: Secondary | ICD-10-CM | POA: Insufficient documentation

## 2022-11-18 DIAGNOSIS — J343 Hypertrophy of nasal turbinates: Secondary | ICD-10-CM | POA: Insufficient documentation

## 2022-11-18 DIAGNOSIS — J342 Deviated nasal septum: Secondary | ICD-10-CM | POA: Insufficient documentation

## 2022-11-18 DIAGNOSIS — J320 Chronic maxillary sinusitis: Secondary | ICD-10-CM | POA: Insufficient documentation

## 2022-11-18 DIAGNOSIS — J338 Other polyp of sinus: Secondary | ICD-10-CM | POA: Insufficient documentation

## 2022-11-23 ENCOUNTER — Encounter: Payer: Self-pay | Admitting: Sports Medicine

## 2022-11-27 ENCOUNTER — Encounter: Payer: Self-pay | Admitting: Physical Therapy

## 2022-11-27 ENCOUNTER — Ambulatory Visit: Payer: Managed Care, Other (non HMO) | Attending: Sports Medicine | Admitting: Physical Therapy

## 2022-11-27 DIAGNOSIS — M6281 Muscle weakness (generalized): Secondary | ICD-10-CM | POA: Diagnosis present

## 2022-11-27 DIAGNOSIS — M2560 Stiffness of unspecified joint, not elsewhere classified: Secondary | ICD-10-CM | POA: Diagnosis present

## 2022-11-27 DIAGNOSIS — R252 Cramp and spasm: Secondary | ICD-10-CM | POA: Diagnosis present

## 2022-11-27 DIAGNOSIS — M25551 Pain in right hip: Secondary | ICD-10-CM | POA: Diagnosis present

## 2022-11-27 NOTE — Therapy (Signed)
OUTPATIENT PHYSICAL THERAPY LOWER EXTREMITY EVALUATION   Patient Name: Kimberly Stevens MRN: [id number] DOB:Mar 29, 1965, 57 y.o., female Today's Date: 11/27/2022  END OF SESSION:  PT End of Session - 11/27/22 1239     Visit Number 1    Number of Visits 12    Date for PT Re-Evaluation 01/08/23    Authorization Type [employer]    PT Start Time 1150    PT Stop Time 1240    PT Time Calculation (min) 50 min    Activity Tolerance Patient tolerated treatment well    Behavior During Therapy WFL for tasks assessed/performed             Past Medical History:  Diagnosis Date   Allergy    seasonal allergies   Arthritis    left great toe joint   Broken arm    right elbow & wrist   Colon polyps    benign   Depression    hx of post partum   Dermoid cyst of ovary    Eye disorder    possible glaucoma-being followed by Dr Deatra James   Fibrocystic breast    IBS (irritable bowel syndrome)    Plantar fasciitis    left foot 2/15-11/15   Polyp of duodenum 2014   tiny   Tendonitis    right knee 2/15-11/15   Torn rotator cuff    right   Past Surgical History:  Procedure Laterality Date   BROW LIFT Bilateral 02/22/2020   Procedure: BLEPHAROPLASTY UPPER EYELID; W/EXCESS SKIN BILATERAL;  Surgeon: Imagene Riches, MD;  Location: [facility];  Service: Ophthalmology;  Laterality: Bilateral;   COLONOSCOPY  2014   JP-MAC-movi(exc)-poylpoid   POLYPECTOMY  2014   polypoid   WISDOM TOOTH EXTRACTION     Patient Active Problem List   Diagnosis Date Noted   Deviated nasal septum 11/18/2022   Hypertrophy of nasal turbinates 11/18/2022   Chronic maxillary sinusitis 11/18/2022   Polyp of nasal sinus 11/18/2022   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   Piriformis syndrome, right 11/03/2022   Cough variant asthma 10/19/2022   Chronic cough 05/04/2022   Hallux rigidus of both feet 07/15/2021   Costochondritis 03/20/2021   Seasonal allergies 09/09/2020   Postcoital UTI 09/09/2020   Strain of hamstring,  subsequent encounter 08/25/2016   Plantar fasciitis of right foot 05/28/2016   Acquired hallux limitus of left foot 05/28/2016   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  She has been doing stretches that have helped a bit.  She is concerned that when her hip is extended it is hard to put wgt on it.  She has pain when lowering her leg from a step or ladder.  She feels like it will give way. She has pain when moving her leg into a position to stretch, but once she is there it spine.  She reports she has always had tight hips.   Has been rolling and working it out.  She is very active and reports doing pickleball, golf,  walking and Peloton workouts.  She has stopped biking a little bit.    PERTINENT HISTORY: Old hamstring tear  Gr toe OA bilateral       PAIN:  Are you having pain? Yes: NPRS scale: 2/10 Pain location: Rt post hip, prox 1/3 hamstring .  Pain and tension along the right lateral thigh, ITB and right proximal hip as well  Pain description: tight Aggravating factors: Pain when changing positions of the leg like crossing it rolling over moving into a position. Relieving factors: tennis ball, stretching, has not tried heat or ice   Pain when active is moderate 5/10-6/10.   PRECAUTIONS: None  RED FLAGS: None   WEIGHT BEARING RESTRICTIONS: No  FALLS:  Has patient fallen in last 6 months? No  LIVING ENVIRONMENT: Lives with: lives with their family Lives in: House/apartment Stairs:  no  issues  Has following equipment at home: None  OCCUPATION: not working   PLOF: Independent  PATIENT GOALS: Pt  would like to have less or no pain without limitation of pain . Get rid of braces.  (Helix)  NEXT MD VISIT: as needed   OBJECTIVE:  Note: Objective measures were completed at Evaluation unless otherwise noted.  DIAGNOSTIC FINDINGS: None performed recently  PATIENT SURVEYS:  FOTO 64 goal is 86 %  COGNITION: Overall cognitive status: Within functional limits for tasks assessed     SENSATION: WFL  EDEMA:    MUSCLE LENGTH: Hamstrings: Right 25 deg; Left 25 deg Min tight hip flexors in prone   POSTURE: decreased lumbar lordosis  PALPATION: Pain with palpation to right piriformis and proximal one third of the hamstring laterally.  No pain or tenderness in gluteus medius. TTP left posterior hip as well but not symptomatic.  Stiffness in  lower lumbar spine  LOWER EXTREMITY ROM: WFL   Passive ROM Right eval Left eval  Hip flexion    Hip extension    Hip abduction    Hip adduction    Hip internal rotation    Hip external rotation    Knee flexion    Knee extension    Ankle dorsiflexion    Ankle plantarflexion    Ankle inversion    Ankle eversion     (Blank rows = not tested)  LOWER EXTREMITY MMT:  MMT Right eval Left eval  Hip flexion 4+ 4+  Hip extension     Hip abduction 3+ 3+  Hip adduction    Hip internal rotation No pain  No pain   Hip external rotation Pain  No pain   Knee flexion 5 5  Knee extension 5 5  Ankle dorsiflexion 5 5  Ankle plantarflexion    Ankle inversion    Ankle eversion     (Blank rows = not tested)  LOWER EXTREMITY SPECIAL TESTS:  Hip special tests: NT on eval   FUNCTIONAL TESTS:  Squat-able to squat without cues and no increased pain.  When asked to increase hip flexion with more of a hinge she did have some discomfort in the hip as well as the back Less functional strength shown with 8 inch step ups in the right  lower extremity compared to the left GAIT: No issues  TODAY'S TREATMENT:  DATE: 11/27/22 PT evaluation completed Streamlined home exercise program Discussed core stabilization and current fitness regime I asked her to pare down her routine a bit, get more focused and reduce stress, mentioned Pilates  She performed her new home exercise program fully see below   PATIENT EDUCATION:  Education details: See above Person educated: Patient Education method: Explanation, Demonstration, Verbal cues, and Handouts Education comprehension: verbalized understanding, returned demonstration, and needs further education  HOME EXERCISE PROGRAM: Added her home program to her previous [facility] program We will get the Code when she returns Hamstring, ITB stretch Side-lying banded hip abduction and clam, green band Piriformis stretch 2 ways  ASSESSMENT:  CLINICAL IMPRESSION: Patient is a 57y.o. female who was seen today for physical therapy evaluation and treatment for right-sided piriformis and lateral hamstring pain.   OBJECTIVE IMPAIRMENTS: decreased mobility, decreased strength, increased fascial restrictions, impaired flexibility, and pain.   ACTIVITY LIMITATIONS: squatting, transfers, and locomotion level  PARTICIPATION LIMITATIONS: community activity  PERSONAL FACTORS: Behavior pattern, Past/current experiences, and 1-2 comorbidities: Mild orthopedic injuries foot and shoulder  are also affecting patient's functional outcome.   REHAB POTENTIAL: Excellent  CLINICAL DECISION MAKING: Stable/uncomplicated  EVALUATION COMPLEXITY: Low   GOALS: Goals reviewed with patient? Yes  SHORT TERM GOALS= LONG TERM GOALS  Target date: 01/08/2023     Patient will be independent with home exercise program Baseline: Goal status: INITIAL  2.  Patient will demonstrate  improved perceived mobility and functional strength with step ups and step downs Baseline:  Goal status: INITIAL  3.  Patient will be able to cross her right leg over the left with ease to improve lower body dressing Baseline:  Goal status: INITIAL  4.  Patient will increase hip abduction strength to 4+/5 bilaterally Baseline:  Goal status: INITIAL  5.  Patient will demonstrate proper squat mechanics without cues and without increased pain Baseline:  Goal status: INITIAL  6.  Foto score will improve to 86% Baseline:  Goal status: INITIAL   PLAN:  PT FREQUENCY: 2x/week  PT DURATION: 6 weeks  PLANNED INTERVENTIONS: 97164- PT Re-evaluation, 97110-Therapeutic exercises, 97530- Therapeutic activity, 97112- Neuromuscular re-education, 97535- Self Care, 16109- Manual therapy, 97014- Electrical stimulation (unattended), 97033- Ionotophoresis 4mg /ml Dexamethasone, Patient/Family education, Dry Needling, Joint mobilization, Spinal mobilization, Cryotherapy, and Moist heat  PLAN FOR NEXT SESSION: Check home program, elliptical for warm up manual therapy, trigger point dry needling to right piriformis right biceps femoris, Pilates reformer and core.   Adrean Heitz, PT 11/27/2022, 12:41 PM   Karie Mainland, PT 11/27/22 12:57 PM Phone: [phone] Fax: [phone]

## 2022-11-29 NOTE — Therapy (Unsigned)
OUTPATIENT PHYSICAL THERAPY NOTE   Patient Name: Kimberly Stevens MRN: [id number] DOB:[birth year], 57 y.o., female Today's Date: 11/30/2022  END OF SESSION:  PT End of Session - 11/30/22 1108     Visit Number 2    Number of Visits 12    Date for PT Re-Evaluation 01/08/23    Authorization Type [employer]    PT Start Time 1105    PT Stop Time 1155    PT Time Calculation (min) 50 min    Activity Tolerance Patient tolerated treatment well    Behavior During Therapy WFL for tasks assessed/performed              Past Medical History:  Diagnosis Date   Allergy    seasonal allergies   Arthritis    left great toe joint   Broken arm    right elbow & wrist   Colon polyps    benign   Depression    hx of post partum   Dermoid cyst of ovary    Eye disorder    possible glaucoma-being followed by Dr Deatra James   Fibrocystic breast    IBS (irritable bowel syndrome)    Plantar fasciitis    left foot 2/15-11/15   Polyp of duodenum 2014   tiny   Tendonitis    right knee 2/15-11/15   Torn rotator cuff    right   Past Surgical History:  Procedure Laterality Date   BROW LIFT Bilateral 02/22/2020   Procedure: BLEPHAROPLASTY UPPER EYELID; W/EXCESS SKIN BILATERAL;  Surgeon: Imagene Riches, MD;  Location: [facility];  Service: Ophthalmology;  Laterality: Bilateral;   COLONOSCOPY  2014   JP-MAC-movi(exc)-poylpoid   POLYPECTOMY  2014   polypoid   WISDOM TOOTH EXTRACTION     Patient Active Problem List   Diagnosis Date Noted   Deviated nasal septum 11/18/2022   Hypertrophy of nasal turbinates 11/18/2022   Chronic maxillary sinusitis 11/18/2022   Polyp of nasal sinus 11/18/2022   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   Piriformis syndrome, right 11/03/2022   Cough variant asthma 10/19/2022   Chronic cough 05/04/2022   Hallux rigidus of both feet 07/15/2021   Costochondritis 03/20/2021   Seasonal allergies 09/09/2020   Postcoital UTI 09/09/2020   Strain of hamstring, subsequent encounter  08/25/2016   Plantar fasciitis of right foot 05/28/2016   Acquired hallux limitus of left foot 05/28/2016   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   EVAL:  Pt has had R sided hip pain about 12 weeks ago.  She has been doing stretches that have helped a bit.  She is concerned that when her hip is extended it is hard to put wgt on it.  She has pain when lowering her leg from a step or ladder.  She feels like it will give way. She has pain when moving her leg into a position to stretch, but once she is there it spine.  She reports she has always had tight hips.   Has been rolling and working it out.  She is very active and reports doing pickleball, golf, walking and Peloton workouts.  She has stopped biking a little bit.    PERTINENT HISTORY: Old hamstring tear  Gr toe OA bilateral   PAIN:  Are you having pain? Yes: NPRS scale: 2-3/10 Pain location: Rt post hip, prox 1/3 hamstring .  Pain and tension along the right lateral thigh, ITB and right proximal hip as well  Pain description: tight Aggravating factors: Pain when changing positions of the leg like crossing it rolling over moving into a position. Relieving factors: tennis ball, stretching, has not tried heat or ice   Pain when active is moderate 5/10-6/10.   PRECAUTIONS: None  RED FLAGS: None   WEIGHT BEARING RESTRICTIONS: No  FALLS:  Has patient fallen in last 6 months?  No  LIVING ENVIRONMENT: Lives with: lives with their family Lives in: House/apartment Stairs:  no issues  Has following equipment at home: None  OCCUPATION: not working   PLOF: Independent  PATIENT GOALS: Pt  would like to have less or no pain without limitation of pain . Get rid of braces.  (Helix)  NEXT MD VISIT: as needed   OBJECTIVE:  Note: Objective measures were completed at Evaluation unless otherwise noted.  DIAGNOSTIC FINDINGS: None performed recently  PATIENT SURVEYS:  FOTO 64 goal is 86 %  COGNITION: Overall cognitive status: Within functional limits for tasks assessed     SENSATION: WFL  EDEMA:    MUSCLE LENGTH: Hamstrings: Right 25 deg; Left 25 deg Min tight hip flexors in prone   POSTURE: decreased lumbar lordosis  PALPATION: Pain with palpation to right piriformis and proximal one third of the hamstring laterally.  No pain or tenderness in gluteus medius. TTP left posterior hip as well but not symptomatic.  Stiffness in  lower lumbar spine  LOWER EXTREMITY ROM: WFL   Passive ROM Right eval Left eval  Hip flexion    Hip extension    Hip abduction    Hip adduction    Hip internal rotation    Hip external rotation    Knee flexion    Knee extension    Ankle dorsiflexion    Ankle plantarflexion    Ankle inversion    Ankle eversion     (Blank rows = not tested)  LOWER EXTREMITY MMT:  MMT Right eval Left eval  Hip flexion 4+ 4+  Hip extension     Hip abduction 3+ 3+  Hip adduction    Hip internal rotation No pain  No pain   Hip external rotation Pain  No pain   Knee flexion 5 5  Knee extension 5 5  Ankle dorsiflexion 5 5  Ankle plantarflexion    Ankle inversion    Ankle eversion     (Blank rows = not tested)  LOWER EXTREMITY SPECIAL TESTS:  Hip special tests: NT on eval   FUNCTIONAL TESTS:  Squat-able to squat without cues and no increased pain.  When asked to increase hip flexion with more of a hinge she did have some  discomfort in the hip as well as the back Less functional strength shown with 8 inch step ups in the right lower extremity compared to the left GAIT: No issues  TODAY'S TREATMENT:  DATEMarlane Mingle Adult PT Treatment:                                                DATE: 11/30/22 Therapeutic Exercise: Hamstring, ITB stretch with strap x 3 , 30 sec  Side-lying banded hip abduction and clam, no band  Piriformis stretch  x 2 , 30 sec  Prone hamstring curl x 10 x 2 sets Prone hip extension 2 x 10  Childs pose  Cat and camel Hip circles in quadruped  Manual Therapy: IASTM, manual therapy to Rt piriformis and ITB and biceps femoris    TrP to Rt biceps femoris, piriformis  Modalities: Cold pack 10 min supine glute and prox hamstring Self Care: Rec. ice 2-3 x per day   Modify activity   11/27/22 PT evaluation completed Streamlined home exercise program Discussed core stabilization and current fitness regime I asked her to pare down her routine a bit, get more focused and reduce stress, mentioned Pilates  She performed her new home exercise program fully see below   PATIENT EDUCATION:  Education details: See above Person educated: Patient Education method: Explanation, Demonstration, Verbal cues, and Handouts Education comprehension: verbalized understanding, returned demonstration, and needs further education  HOME EXERCISE PROGRAM:  [email or web address] Access Code: [email or web address] URL: [email or web address] Date: 11/30/2022 Prepared by: Karie Mainland  Exercises - Supine Piriformis Stretch  - 1 x daily - 7 x weekly - 3 sets - 10 reps - Supine Piriformis Stretch  - 2 x daily - 7 x weekly - 1 sets - 3 reps - 30 second hold - Sidelying Hip Abduction  - 1 x daily - 7 x weekly - 3 sets - 10 reps - Supine Hamstring Stretch with Strap  - 1 x daily - 7 x weekly - 1 sets  - 3-5 reps - 30 hold - Supine ITB Stretch with Strap  - 1 x daily - 7 x weekly - 1 sets - 3-5 reps - 30 hold - Clamshell with Resistance  - 1 x daily - 7 x weekly - 2 sets - 10 reps - 5 hold   ASSESSMENT:  CLINICAL IMPRESSION: Pt with continued pain in Rt post hip, hamstring.  She feels she may have re-injured it over the weekend.  She did not ice at the time but I encouraged her to do so going forward.  She has an appt Monday and will request [facility] [place].  Focused more on manual therapy to see if that can reduce spasm an tendon along Rt post hip.   OBJECTIVE IMPAIRMENTS: decreased mobility, decreased strength, increased fascial restrictions, impaired flexibility, and pain.   ACTIVITY LIMITATIONS: squatting, transfers, and locomotion level  PARTICIPATION LIMITATIONS: community activity  PERSONAL FACTORS: Behavior pattern, Past/current experiences, and 1-2 comorbidities: Mild orthopedic injuries foot and shoulder  are also affecting patient's functional outcome.   REHAB POTENTIAL: Excellent  CLINICAL DECISION MAKING: Stable/uncomplicated  EVALUATION COMPLEXITY: Low   GOALS: Goals reviewed with patient? Yes  SHORT TERM GOALS= LONG TERM GOALS  Target date: 01/08/2023     Patient will be independent with home exercise program Baseline: Goal status: INITIAL  2.  Patient will demonstrate improved perceived mobility and functional strength with step ups and step downs Baseline:  Goal status: INITIAL  3.  Patient will be able to cross her right leg over the left with ease to  improve lower body dressing Baseline:  Goal status: INITIAL  4.  Patient will increase hip abduction strength to 4+/5 bilaterally Baseline:  Goal status: INITIAL  5.  Patient will demonstrate proper squat mechanics without cues and without increased pain Baseline:  Goal status: INITIAL  6.  Foto score will improve to 86% Baseline:  Goal status: INITIAL   PLAN:  PT FREQUENCY: 2x/week  PT DURATION: 6  weeks  PLANNED INTERVENTIONS: 97164- PT Re-evaluation, 97110-Therapeutic exercises, 97530- Therapeutic activity, 97112- Neuromuscular re-education, 97535- Self Care, 66063- Manual therapy, 97014- Electrical stimulation (unattended), 97033- Ionotophoresis 4mg /ml Dexamethasone, Patient/Family education, Dry Needling, Joint mobilization, Spinal mobilization, Cryotherapy, and Moist heat  PLAN FOR NEXT SESSION: Check home program, elliptical for warm up manual therapy, trigger point dry needling to right piriformis right biceps femoris, Pilates reformer and core.   Ruhani Umland, PT 11/30/2022, 11:52 AM   Karie Mainland, PT 11/30/22 11:52 AM Phone: [phone] Fax: [phone]

## 2022-11-30 ENCOUNTER — Encounter: Payer: Self-pay | Admitting: Physical Therapy

## 2022-11-30 ENCOUNTER — Ambulatory Visit: Payer: Managed Care, Other (non HMO) | Admitting: Physical Therapy

## 2022-11-30 DIAGNOSIS — M6281 Muscle weakness (generalized): Secondary | ICD-10-CM

## 2022-11-30 DIAGNOSIS — M25551 Pain in right hip: Secondary | ICD-10-CM

## 2022-11-30 DIAGNOSIS — M2560 Stiffness of unspecified joint, not elsewhere classified: Secondary | ICD-10-CM

## 2022-11-30 DIAGNOSIS — R252 Cramp and spasm: Secondary | ICD-10-CM

## 2022-12-07 ENCOUNTER — Ambulatory Visit (INDEPENDENT_AMBULATORY_CARE_PROVIDER_SITE_OTHER): Payer: Managed Care, Other (non HMO) | Admitting: Family Medicine

## 2022-12-07 ENCOUNTER — Other Ambulatory Visit: Payer: Self-pay

## 2022-12-07 ENCOUNTER — Encounter: Payer: Self-pay | Admitting: Family Medicine

## 2022-12-07 VITALS — BP 102/70 | Ht 62.75 in | Wt 131.0 lb

## 2022-12-07 DIAGNOSIS — G5701 Lesion of sciatic nerve, right lower limb: Secondary | ICD-10-CM

## 2022-12-07 MED ORDER — NITROGLYCERIN 0.2 MG/HR TD PT24: MEDICATED_PATCH | TRANSDERMAL | 1 refills | Status: DC

## 2022-12-07 NOTE — Progress Notes (Signed)
PCP: Shelva Majestic, MD  Subjective:   HPI: Patient is a 57 y.o. female here for follow-up of right hip pain.  Previously seen 11/03/2022 at which point felt she had piriformis syndrome.  She has been doing physical therapy as well as home exercises.  She also felt her hamstrings could be tight (she has had a prior hamstring tear 15 years ago), and the physical therapist gave her some exercises for this too.  Overall her pain is improved, though she is not back to her baseline.  She recently walked more yesterday which she has been trying to hold off on.  She would like to know if she can get back to walking and playing pickleball.  Past Medical History:  Diagnosis Date   Allergy    seasonal allergies   Arthritis    left great toe joint   Broken arm    right elbow & wrist   Colon polyps    benign   Depression    hx of post partum   Dermoid cyst of ovary    Eye disorder    possible glaucoma-being followed by Dr Deatra James   Fibrocystic breast    IBS (irritable bowel syndrome)    Plantar fasciitis    left foot 2/15-11/15   Polyp of duodenum 2014   tiny   Tendonitis    right knee 2/15-11/15   Torn rotator cuff    right    Current Outpatient Medications on File Prior to Visit  Medication Sig Dispense Refill   Albuterol-Budesonide (AIRSUPRA) 90-80 MCG/ACT AERO Inhale 2 puffs into the lungs every 4 (four) hours as needed. 10.7 g 0   amoxicillin-clavulanate (AUGMENTIN) 875-125 MG tablet Take 1 tablet by mouth 2 (two) times daily. (Patient not taking: Reported on 11/27/2022) 28 tablet 0   famotidine (PEPCID) 20 MG tablet Take 20 mg by mouth 2 (two) times daily.     Fexofenadine HCl (ALLEGRA PO) Take by mouth.     HYDROcodone bit-homatropine (HYCODAN) 5-1.5 MG/5ML syrup Take 5 mLs by mouth every 4 (four) hours as needed. (Patient not taking: Reported on 11/27/2022) 240 mL 0   omeprazole (PRILOSEC OTC) 20 MG tablet Take 20 mg by mouth.     predniSONE (DELTASONE) 10 MG tablet Take  4 each  am x 2 days,   2 each am x 2 days,  1 each am x 2 days and stop (Patient not taking: Reported on 11/27/2022) 14 tablet 0   No current facility-administered medications on file prior to visit.    Past Surgical History:  Procedure Laterality Date   BROW LIFT Bilateral 02/22/2020   Procedure: BLEPHAROPLASTY UPPER EYELID; W/EXCESS SKIN BILATERAL;  Surgeon: Imagene Riches, MD;  Location: [facility];  Service: Ophthalmology;  Laterality: Bilateral;   COLONOSCOPY  2014   JP-MAC-movi(exc)-poylpoid   POLYPECTOMY  2014   polypoid   WISDOM TOOTH EXTRACTION      Allergies  Allergen Reactions   Gluten Meal Diarrhea   Lactose Intolerance (Gi)     BP 102/70   Ht 5' 2.75" (1.594 m)   Wt 131 lb (59.4 kg)   LMP 06/20/2017 (Approximate)   BMI 23.39 kg/m       No data to display              No data to display              Objective:  Physical Exam:  Gen: NAD, comfortable in exam room  Right hip: No  deformity. Full range of motion with 4/5 strength with hip abduction.  5/5 other hip muscles. No tenderness to palpation over greater trochanter, mild tenderness over piriformis/gluteus medius. Neurovascularly intact distally. Negative logroll testing Equivocal FADIR testing and piriformis stretches.   Limited ultrasound right hip: Mild hypoechogenicity with small areas of hyperechogenicity proximal hamstring origin without apparent tear.  Piriformis and sciatic nerve unremarkable.  Gluteus muscles normal.   Assessment & Plan:  1.  Piriformis syndrome: Improving with PT and home exercises.  Can get back to walking on flat surfaces gradually -- for the first week every other day and then daily.  Can continue biking and swimming depending on pain level.  Abstain from pickleball until follow-up.  Continue work with PT.  2.  Hamstring tendinopathy: As evidenced on exam and on ultrasound.  Likely contributing to pain.  Can continue exercises above.  Continue to work with PT. Add  nitroglycerin patches to area of pain daily.  Follow-up in 6 weeks or sooner if needed.  Kimberly Holmes, MD PGY-2, [facility]

## 2022-12-07 NOTE — Patient Instructions (Addendum)
Your ultrasound shows proximal hamstring tendinopathy though overall the hip external rotators and gluteal tendons look good and just need to be stretched and strengthened. Add nitroglycerin patches 1/4th patch to affected area and change daily. Continue physical therapy and home exercises on days you don't go to therapy. Walk for exercise on flat surface every other day this week before you start going daily. Biking, swimming depends on the pain level. Follow up with [place] in 6 weeks.

## 2022-12-08 ENCOUNTER — Ambulatory Visit: Payer: Managed Care, Other (non HMO) | Admitting: Sports Medicine

## 2022-12-08 NOTE — Therapy (Unsigned)
OUTPATIENT PHYSICAL THERAPY NOTE   Patient Name: Kimberly Stevens MRN: [id number] DOB:March 28, 1965, 57 y.o., female Today's Date: 12/10/2022  END OF SESSION:  PT End of Session - 12/09/22 1610     Visit Number 3    Number of Visits 12    Date for PT Re-Evaluation 01/08/23    Authorization Type [employer]    PT Start Time 1550    PT Stop Time 1635    PT Time Calculation (min) 45 min    Activity Tolerance Patient tolerated treatment well    Behavior During Therapy WFL for tasks assessed/performed               Past Medical History:  Diagnosis Date   Allergy    seasonal allergies   Arthritis    left great toe joint   Broken arm    right elbow & wrist   Colon polyps    benign   Depression    hx of post partum   Dermoid cyst of ovary    Eye disorder    possible glaucoma-being followed by Dr Deatra James   Fibrocystic breast    IBS (irritable bowel syndrome)    Plantar fasciitis    left foot 2/15-11/15   Polyp of duodenum 2014   tiny   Tendonitis    right knee 2/15-11/15   Torn rotator cuff    right   Past Surgical History:  Procedure Laterality Date   BROW LIFT Bilateral 02/22/2020   Procedure: BLEPHAROPLASTY UPPER EYELID; W/EXCESS SKIN BILATERAL;  Surgeon: Imagene Riches, MD;  Location: [facility];  Service: Ophthalmology;  Laterality: Bilateral;   COLONOSCOPY  2014   JP-MAC-movi(exc)-poylpoid   POLYPECTOMY  2014   polypoid   WISDOM TOOTH EXTRACTION     Patient Active Problem List   Diagnosis Date Noted   Deviated nasal septum 11/18/2022   Hypertrophy of nasal turbinates 11/18/2022   Chronic maxillary sinusitis 11/18/2022   Polyp of nasal sinus 11/18/2022   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   Piriformis syndrome, right 11/03/2022   Cough variant asthma 10/19/2022   Chronic cough 05/04/2022   Hallux rigidus of both feet 07/15/2021   Costochondritis 03/20/2021   Seasonal allergies 09/09/2020   Postcoital UTI 09/09/2020   Strain of hamstring, subsequent  encounter 08/25/2016   Plantar fasciitis of right foot 05/28/2016   Acquired hallux limitus of left foot 05/28/2016   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 I still feel like theres something there (points to Rt hamstring) . See Dr's note.  [place] completed and they didn't find anything.   EVAL:  Pt has had R sided hip pain about 12 weeks ago.  She has been doing stretches that have helped a bit.  She is concerned that when her hip is extended it is hard to put wgt on it.  She has pain when lowering her leg from a step or ladder.  She feels like it will give way. She has pain when moving her leg into a position to stretch, but once  she is there it spine.  She reports she has always had tight hips.   Has been rolling and working it out.  She is very active and reports doing pickleball, golf, walking and Peloton workouts.  She has stopped biking a little bit.    PERTINENT HISTORY: Old hamstring tear  Gr toe OA bilateral   11/18.24: Dr Pearletha Forge visit   1)Piriformis syndrome: Improving with PT and home exercises.  Can get back to walking on flat surfaces gradually -- for the first week every other day and then daily.  Can continue biking and swimming depending on pain level.  Abstain from pickleball until follow-up.  Continue work with PT.   2.  Hamstring tendinopathy: As evidenced on exam and on ultrasound.  Likely contributing to pain.  Can continue exercises above.  Continue to work with PT. Add nitroglycerin patches to area of  pain daily.    PAIN:  Are you having pain? Yes: NPRS scale: 2/10 Pain location: Rt post hip, prox 1/3 hamstring .  Pain and tension along the right lateral thigh, ITB and right proximal hip as well  Pain description: tight Aggravating factors: Pain when changing positions of the leg like crossing it rolling over moving into a position. Relieving factors: tennis ball, stretching, has not tried heat or ice   Pain when active is moderate 5/10-6/10.   PRECAUTIONS: None  RED FLAGS: None   WEIGHT BEARING RESTRICTIONS: No  FALLS:  Has patient fallen in last 6 months? No  LIVING ENVIRONMENT: Lives with: lives with their family Lives in: House/apartment Stairs:  no issues  Has following equipment at home: None  OCCUPATION: not working   PLOF: Independent  PATIENT GOALS: Pt  would like to have less or no pain without limitation of pain . Get rid of braces.  (Helix)  NEXT MD VISIT: as needed   OBJECTIVE:  Note: Objective measures were completed at Evaluation unless otherwise noted.  DIAGNOSTIC FINDINGS: None performed recently  PATIENT SURVEYS:  FOTO 64 goal is 86 %  COGNITION: Overall cognitive status: Within functional limits for tasks assessed     SENSATION: WFL  EDEMA:    MUSCLE LENGTH: Hamstrings: Right 25 deg; Left 25 deg Min tight hip flexors in prone   POSTURE: decreased lumbar lordosis  PALPATION: Pain with palpation to right piriformis and proximal one third of the hamstring laterally.  No pain or tenderness in gluteus medius. TTP left posterior hip as well but not symptomatic.  Stiffness in lower lumbar spine  LOWER EXTREMITY ROM: WFL   Passive ROM Right eval Left eval  Hip flexion    Hip extension    Hip abduction    Hip adduction    Hip internal rotation    Hip external rotation    Knee flexion    Knee extension    Ankle dorsiflexion    Ankle plantarflexion    Ankle inversion    Ankle eversion     (Blank rows = not tested)  LOWER  EXTREMITY MMT:  MMT Right eval Left eval  Hip flexion 4+ 4+  Hip extension     Hip abduction 3+ 3+  Hip adduction    Hip internal rotation No pain  No pain   Hip external rotation Pain  No pain   Knee flexion 5 5  Knee extension 5 5  Ankle dorsiflexion 5 5  Ankle plantarflexion    Ankle inversion    Ankle eversion     (Blank rows = not  tested)  LOWER EXTREMITY SPECIAL TESTS:  Hip special tests: NT on eval   FUNCTIONAL TESTS:  Squat-able to squat without cues and no increased pain.  When asked to increase hip flexion with more of a hinge she did have some discomfort in the hip as well as the back Less functional strength shown with 8 inch step ups in the right lower extremity compared to the left GAIT: No issues  TODAY'S TREATMENT:                                                                                                                              DATE:    [facility] Adult PT Treatment:                                                DATE: 12/09/22 Therapeutic Exercise: Recumbent bike L 3, 5 min  Supine static hamstring and ITB  Dynamic hamstring stretch with towel Rt LE only, continuous and gentle  Bridge (glute)  BOSU hamstring bridge x 10 double  BOSU single  bridge x 10  HS bridge hold with march   Prone hamstring curl 15 lbs bilateral legs x 10 Prone hip extension x 15  Childs pose  Quadruped clam and hydrant x 15  Standing pigeon pose   [facility] Adult PT Treatment:                                                DATE: 11/30/22 Therapeutic Exercise: Hamstring, ITB stretch with strap x 3 , 30 sec  Side-lying banded hip abduction and clam, no band  Piriformis stretch  x 2 , 30 sec  Prone hamstring curl x 10 x 2 sets Prone hip extension 2 x 10  Childs pose  Cat and camel Hip circles in quadruped   Manual Therapy: IASTM, manual therapy to Rt piriformis and ITB and biceps femoris    TrP to Rt biceps femoris, piriformis  Modalities: Cold pack 10 min supine glute  and prox hamstring Self Care: Rec. ice 2-3 x per day   Modify activity   11/27/22 PT evaluation completed Streamlined home exercise program Discussed core stabilization and current fitness regime I asked her to pare down her routine a bit, get more focused and reduce stress, mentioned Pilates  She performed her new home exercise program fully see below   PATIENT EDUCATION:  Education details: See above Person educated: Patient Education method: Explanation, Demonstration, Verbal cues, and Handouts Education comprehension: verbalized understanding, returned demonstration, and needs further education  HOME EXERCISE PROGRAM:  [email or web address] Access Code: [email or web address] URL: [email or web address] Date: 11/30/2022 Prepared by: Karie Mainland  Exercises - Supine Piriformis Stretch  - 1 x daily -  7 x weekly - 3 sets - 10 reps - Supine Piriformis Stretch  - 2 x daily - 7 x weekly - 1 sets - 3 reps - 30 second hold - Sidelying Hip Abduction  - 1 x daily - 7 x weekly - 3 sets - 10 reps - Supine Hamstring Stretch with Strap  - 1 x daily - 7 x weekly - 1 sets - 3-5 reps - 30 hold - Supine ITB Stretch with Strap  - 1 x daily - 7 x weekly - 1 sets - 3-5 reps - 30 hold - Clamshell with Resistance  - 1 x daily - 7 x weekly - 2 sets - 10 reps - 5 hold   ASSESSMENT:  CLINICAL IMPRESSION: Patient focused on strengthening today with proper muscle activation and control.  She did feel fatigue and discomfort with hamstring isolation.  Pain in Rt piriformis is in the changing of positions (going into the stretch) and she can eventually stay in a stretch position.  She has modified her exerise routine and can tell a small improvement overall.  She did apply a nitroglycerin patch which she has done before but had a bad reaction of chest tightness. She is looking forward to trying dry needling next visit.   OBJECTIVE IMPAIRMENTS: decreased mobility, decreased strength, increased fascial restrictions,  impaired flexibility, and pain.   ACTIVITY LIMITATIONS: squatting, transfers, and locomotion level  PARTICIPATION LIMITATIONS: community activity  PERSONAL FACTORS: Behavior pattern, Past/current experiences, and 1-2 comorbidities: Mild orthopedic injuries foot and shoulder  are also affecting patient's functional outcome.   REHAB POTENTIAL: Excellent  CLINICAL DECISION MAKING: Stable/uncomplicated  EVALUATION COMPLEXITY: Low   GOALS: Goals reviewed with patient? Yes  SHORT TERM GOALS= LONG TERM GOALS  Target date: 01/08/2023     Patient will be independent with home exercise program Baseline: Goal status:ongoing, up to date   2.  Patient will demonstrate improved perceived mobility and functional strength with step ups and step downs Baseline:  Goal status: ongoing   3.  Patient will be able to cross her right leg over the left with ease to improve lower body dressing Baseline:  Goal status: ongoing   4.  Patient will increase hip abduction strength to 4+/5 bilaterally Baseline:  Goal status: ongoing   5.  Patient will demonstrate proper squat mechanics without cues and without increased pain Baseline:  Goal status:ongoing   6.  Foto score will improve to 86% Baseline:  Goal status: INITIAL   PLAN:  PT FREQUENCY: 2x/week  PT DURATION: 6 weeks  PLANNED INTERVENTIONS: 97164- PT Re-evaluation, 97110-Therapeutic exercises, 97530- Therapeutic activity, 97112- Neuromuscular re-education, 97535- Self Care, 16109- Manual therapy, 97014- Electrical stimulation (unattended), 97033- Ionotophoresis 4mg /ml Dexamethasone, Patient/Family education, Dry Needling, Joint mobilization, Spinal mobilization, Cryotherapy, and Moist heat  PLAN FOR NEXT SESSION: hip abd/ext strength, manual therapy, trigger point dry needling to right piriformis right biceps femoris, Pilates reformer and core.   Arneshia Ade, PT 12/10/2022, 7:39 AM   Karie Mainland, PT 12/10/22 7:39 AM Phone:  [phone] Fax: [phone]

## 2022-12-09 ENCOUNTER — Encounter: Payer: Self-pay | Admitting: Sports Medicine

## 2022-12-09 ENCOUNTER — Ambulatory Visit: Payer: Managed Care, Other (non HMO) | Admitting: Physical Therapy

## 2022-12-09 DIAGNOSIS — M25551 Pain in right hip: Secondary | ICD-10-CM

## 2022-12-09 DIAGNOSIS — M2560 Stiffness of unspecified joint, not elsewhere classified: Secondary | ICD-10-CM

## 2022-12-09 DIAGNOSIS — R252 Cramp and spasm: Secondary | ICD-10-CM

## 2022-12-09 DIAGNOSIS — M6281 Muscle weakness (generalized): Secondary | ICD-10-CM

## 2022-12-11 ENCOUNTER — Ambulatory Visit: Payer: Managed Care, Other (non HMO)

## 2022-12-11 DIAGNOSIS — M25551 Pain in right hip: Secondary | ICD-10-CM

## 2022-12-11 DIAGNOSIS — M6281 Muscle weakness (generalized): Secondary | ICD-10-CM

## 2022-12-11 DIAGNOSIS — R252 Cramp and spasm: Secondary | ICD-10-CM

## 2022-12-11 NOTE — Therapy (Signed)
OUTPATIENT PHYSICAL THERAPY NOTE   Patient Name: Kimberly Stevens MRN: [id number] DOB:Sep 02, 1965, 57 y.o., female Today's Date: 12/11/2022  END OF SESSION:  PT End of Session - 12/11/22 0852     Visit Number 4    Number of Visits 12    Date for PT Re-Evaluation 01/08/23    Authorization Type [employer]    PT Start Time 0830    PT Stop Time 0915    PT Time Calculation (min) 45 min    Activity Tolerance Patient tolerated treatment well    Behavior During Therapy [facility] for tasks assessed/performed                Past Medical History:  Diagnosis Date   Allergy    seasonal allergies   Arthritis    left great toe joint   Broken arm    right elbow & wrist   Colon polyps    benign   Depression    hx of post partum   Dermoid cyst of ovary    Eye disorder    possible glaucoma-being followed by Dr Deatra James   Fibrocystic breast    IBS (irritable bowel syndrome)    Plantar fasciitis    left foot 2/15-11/15   Polyp of duodenum 2014   tiny   Tendonitis    right knee 2/15-11/15   Torn rotator cuff    right   Past Surgical History:  Procedure Laterality Date   BROW LIFT Bilateral 02/22/2020   Procedure: BLEPHAROPLASTY UPPER EYELID; W/EXCESS SKIN BILATERAL;  Surgeon: Imagene Riches, MD;  Location: [facility];  Service: Ophthalmology;  Laterality: Bilateral;   COLONOSCOPY  2014   JP-MAC-movi(exc)-poylpoid   POLYPECTOMY  2014   polypoid   WISDOM TOOTH EXTRACTION     Patient Active Problem List   Diagnosis Date Noted   Deviated nasal septum 11/18/2022   Hypertrophy of nasal turbinates 11/18/2022   Chronic maxillary sinusitis 11/18/2022   Polyp of nasal sinus 11/18/2022   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   Piriformis syndrome, right 11/03/2022   Cough variant asthma 10/19/2022   Chronic cough 05/04/2022   Hallux rigidus of both feet 07/15/2021   Costochondritis 03/20/2021   Seasonal allergies 09/09/2020   Postcoital UTI 09/09/2020   Strain of hamstring, subsequent  encounter 08/25/2016   Plantar fasciitis of right foot 05/28/2016   Acquired hallux limitus of left foot 05/28/2016   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 I still feel like theres something there (points to Rt hamstring) . See Dr's note.  [place] completed and they didn't find anything.   EVAL:  Pt has had R sided hip pain about 12 weeks ago.  She has been doing stretches that have helped a bit.  She is concerned that when her hip is extended it is hard to put wgt on it.  She has pain when lowering her leg from a step or ladder.  She feels like it will give way. She has pain when moving her leg into a position to stretch, but once she is there it  spine.  She reports she has always had tight hips.   Has been rolling and working it out.  She is very active and reports doing pickleball, golf, walking and Peloton workouts.  She has stopped biking a little bit.    PERTINENT HISTORY: Old hamstring tear  Gr toe OA bilateral   11/18.24: Dr Pearletha Forge visit   1)Piriformis syndrome: Improving with PT and home exercises.  Can get back to walking on flat surfaces gradually -- for the first week every other day and then daily.  Can continue biking and swimming depending on pain level.  Abstain from pickleball until follow-up.  Continue work with PT.   2.  Hamstring tendinopathy: As evidenced on exam and on ultrasound.  Likely contributing to pain.  Can continue exercises above.  Continue to work with PT. Add nitroglycerin patches to area of pain daily.    PAIN:  Are  you having pain? Yes: NPRS scale: 2/10 Pain location: Rt post hip, prox 1/3 hamstring .  Pain and tension along the right lateral thigh, ITB and right proximal hip as well  Pain description: tight Aggravating factors: Pain when changing positions of the leg like crossing it rolling over moving into a position. Relieving factors: tennis ball, stretching, has not tried heat or ice   Pain when active is moderate 5/10-6/10.   PRECAUTIONS: None  RED FLAGS: None   WEIGHT BEARING RESTRICTIONS: No  FALLS:  Has patient fallen in last 6 months? No  LIVING ENVIRONMENT: Lives with: lives with their family Lives in: House/apartment Stairs:  no issues  Has following equipment at home: None  OCCUPATION: not working   PLOF: Independent  PATIENT GOALS: Pt  would like to have less or no pain without limitation of pain . Get rid of braces.  (Helix)  NEXT MD VISIT: as needed   OBJECTIVE:  Note: Objective measures were completed at Evaluation unless otherwise noted.  DIAGNOSTIC FINDINGS: None performed recently  PATIENT SURVEYS:  FOTO 64 goal is 86 %  COGNITION: Overall cognitive status: Within functional limits for tasks assessed     SENSATION: WFL  EDEMA:    MUSCLE LENGTH: Hamstrings: Right 25 deg; Left 25 deg Min tight hip flexors in prone   POSTURE: decreased lumbar lordosis  PALPATION: Pain with palpation to right piriformis and proximal one third of the hamstring laterally.  No pain or tenderness in gluteus medius. TTP left posterior hip as well but not symptomatic.  Stiffness in lower lumbar spine  LOWER EXTREMITY ROM: WFL   Passive ROM Right eval Left eval  Hip flexion    Hip extension    Hip abduction    Hip adduction    Hip internal rotation    Hip external rotation    Knee flexion    Knee extension    Ankle dorsiflexion    Ankle plantarflexion    Ankle inversion    Ankle eversion     (Blank rows = not tested)  LOWER EXTREMITY MMT:  MMT Right eval  Left eval  Hip flexion 4+ 4+  Hip extension     Hip abduction 3+ 3+  Hip adduction    Hip internal rotation No pain  No pain   Hip external rotation Pain  No pain   Knee flexion 5 5  Knee extension 5 5  Ankle dorsiflexion 5 5  Ankle plantarflexion    Ankle inversion    Ankle eversion     (Blank rows = not tested)  LOWER EXTREMITY  SPECIAL TESTS:  Hip special tests: NT on eval   FUNCTIONAL TESTS:  Squat-able to squat without cues and no increased pain.  When asked to increase hip flexion with more of a hinge she did have some discomfort in the hip as well as the back Less functional strength shown with 8 inch step ups in the right lower extremity compared to the left GAIT: No issues  TODAY'S TREATMENT:                                                                                                                              [facility] Adult PT Treatment:                                                DATE: 12/11/22 Therapeutic Exercise: R piriformis stretch with towel 30s x2 R hip flexor stertch 30s x2 Prone on elbows 2 min Prone press 10x f/b 10 with PT OP Manual Therapy: Skilled palpation to identify taught and irritable bands in R piriformis as well as R proximal biceps femoris Trigger Point Dry Needling Treatment: Pre-treatment instruction: Patient instructed on dry needling rationale, procedures, and possible side effects including pain during treatment (achy,cramping feeling), bruising, drop of blood, lightheadedness, nausea, sweating. Patient Consent Given: Yes Education handout provided: No Muscles treated: R piriformis (caudal fibers)  Needle size and number: .30x74mm x 1 Electrical stimulation performed: No Parameters: N/A Treatment response/outcome: Twitch response elicited and Palpable decrease in muscle tension Post-treatment instructions: Patient instructed to expect possible mild to moderate muscle soreness later today and/or tomorrow. Patient instructed in methods  to reduce muscle soreness and to continue prescribed HEP. If patient was dry needled over the lung field, patient was instructed on signs and symptoms of pneumothorax and, however unlikely, to see immediate medical attention should they occur. Patient was also educated on signs and symptoms of infection and to seek medical attention should they occur. Patient verbalized understanding of these instructions and education.   [facility] Adult PT Treatment:                                                DATE: 12/09/22 Therapeutic Exercise: Recumbent bike L 3, 5 min  Supine static hamstring and ITB  Dynamic hamstring stretch with towel Rt LE only, continuous and gentle  Bridge (glute)  BOSU hamstring bridge x 10 double  BOSU single  bridge x 10  HS bridge hold with march   Prone hamstring curl 15 lbs bilateral legs x 10 Prone hip extension x 15  Childs pose  Quadruped clam and hydrant x 15  Standing pigeon pose   [facility] Adult PT Treatment:  DATE: 11/30/22 Therapeutic Exercise: Hamstring, ITB stretch with strap x 3 , 30 sec  Side-lying banded hip abduction and clam, no band  Piriformis stretch  x 2 , 30 sec  Prone hamstring curl x 10 x 2 sets Prone hip extension 2 x 10  Childs pose  Cat and camel Hip circles in quadruped   Manual Therapy: IASTM, manual therapy to Rt piriformis and ITB and biceps femoris    TrP to Rt biceps femoris, piriformis  Modalities: Cold pack 10 min supine glute and prox hamstring Self Care: Rec. ice 2-3 x per day   Modify activity   11/27/22 PT evaluation completed Streamlined home exercise program Discussed core stabilization and current fitness regime I asked her to pare down her routine a bit, get more focused and reduce stress, mentioned Pilates  She performed her new home exercise program fully see below   PATIENT EDUCATION:  Education details: See above Person educated: Patient Education method: Explanation,  Demonstration, Verbal cues, and Handouts Education comprehension: verbalized understanding, returned demonstration, and needs further education  HOME EXERCISE PROGRAM:  [email or web address] Access Code: [email or web address] URL: [email or web address] Date: 11/30/2022 Prepared by: Karie Mainland  Exercises - Supine Piriformis Stretch  - 1 x daily - 7 x weekly - 3 sets - 10 reps - Supine Piriformis Stretch  - 2 x daily - 7 x weekly - 1 sets - 3 reps - 30 second hold - Sidelying Hip Abduction  - 1 x daily - 7 x weekly - 3 sets - 10 reps - Supine Hamstring Stretch with Strap  - 1 x daily - 7 x weekly - 1 sets - 3-5 reps - 30 hold - Supine ITB Stretch with Strap  - 1 x daily - 7 x weekly - 1 sets - 3-5 reps - 30 hold - Clamshell with Resistance  - 1 x daily - 7 x weekly - 2 sets - 10 reps - 5 hold   ASSESSMENT:  CLINICAL IMPRESSION: Incorporated TPDN to R piriformis today to address ongoing and chronic discomfort. Patient tolerated technique well and reported initial benefit despite mild apprehension at first. Followed up with review of R piriformis and hip flexor stretching advancing to progressive extension task starting in prone prop, prone press then prone press up with PT OP.  Patient lacking lumbar extension but mobility increased following interventions.   Patient focused on strengthening today with proper muscle activation and control.  She did feel fatigue and discomfort with hamstring isolation.  Pain in Rt piriformis is in the changing of positions (going into the stretch) and she can eventually stay in a stretch position.  She has modified her exerise routine and can tell a small improvement overall.  She did apply a nitroglycerin patch which she has done before but had a bad reaction of chest tightness. She is looking forward to trying dry needling next visit.   OBJECTIVE IMPAIRMENTS: decreased mobility, decreased strength, increased fascial restrictions, impaired flexibility, and pain.    ACTIVITY LIMITATIONS: squatting, transfers, and locomotion level  PARTICIPATION LIMITATIONS: community activity  PERSONAL FACTORS: Behavior pattern, Past/current experiences, and 1-2 comorbidities: Mild orthopedic injuries foot and shoulder  are also affecting patient's functional outcome.   REHAB POTENTIAL: Excellent  CLINICAL DECISION MAKING: Stable/uncomplicated  EVALUATION COMPLEXITY: Low   GOALS: Goals reviewed with patient? Yes  SHORT TERM GOALS= LONG TERM GOALS  Target date: 01/08/2023     Patient will be independent with home exercise program Baseline: Goal status:ongoing, up to date   2.  Patient will demonstrate improved perceived mobility and functional strength with step ups and step downs Baseline:  Goal status: ongoing   3.  Patient will be able to cross her right leg over the left with ease to improve lower body dressing Baseline:  Goal status: ongoing   4.  Patient will increase hip abduction strength to 4+/5 bilaterally Baseline:  Goal status: ongoing   5.  Patient will demonstrate proper squat mechanics without cues and without increased pain Baseline:  Goal status:ongoing   6.  Foto score will improve to 86% Baseline:  Goal status: INITIAL   PLAN:  PT FREQUENCY: 2x/week  PT DURATION: 6 weeks  PLANNED INTERVENTIONS: 97164- PT Re-evaluation, 97110-Therapeutic exercises, 97530- Therapeutic activity, 97112- Neuromuscular re-education, 97535- Self Care, 40981- Manual therapy, 97014- Electrical stimulation (unattended), 97033- Ionotophoresis 4mg /ml Dexamethasone, Patient/Family education, Dry Needling, Joint mobilization, Spinal mobilization, Cryotherapy, and Moist heat  PLAN FOR NEXT SESSION: hip abd/ext strength, manual therapy, trigger point dry needling to right piriformis right biceps femoris, Pilates reformer and core.   Hildred Laser, PT 12/11/2022, 10:52 AM   Karie Mainland, PT 12/11/22 10:52 AM Phone: [phone] Fax:  [phone]

## 2022-12-14 NOTE — Therapy (Unsigned)
OUTPATIENT PHYSICAL THERAPY NOTE   Patient Name: Kimberly Stevens MRN: [id number] DOB:1965-02-15, 57 y.o., female Today's Date: 12/15/2022  END OF SESSION:  PT End of Session - 12/15/22 1229     Visit Number 5    Number of Visits 12    Date for PT Re-Evaluation 01/08/23    Authorization Type [employer]    PT Start Time 1150    PT Stop Time 1230    PT Time Calculation (min) 40 min    Activity Tolerance Patient tolerated treatment well    Behavior During Therapy WFL for tasks assessed/performed                 Past Medical History:  Diagnosis Date   Allergy    seasonal allergies   Arthritis    left great toe joint   Broken arm    right elbow & wrist   Colon polyps    benign   Depression    hx of post partum   Dermoid cyst of ovary    Eye disorder    possible glaucoma-being followed by Dr Deatra James   Fibrocystic breast    IBS (irritable bowel syndrome)    Plantar fasciitis    left foot 2/15-11/15   Polyp of duodenum 2014   tiny   Tendonitis    right knee 2/15-11/15   Torn rotator cuff    right   Past Surgical History:  Procedure Laterality Date   BROW LIFT Bilateral 02/22/2020   Procedure: BLEPHAROPLASTY UPPER EYELID; W/EXCESS SKIN BILATERAL;  Surgeon: Imagene Riches, MD;  Location: [facility];  Service: Ophthalmology;  Laterality: Bilateral;   COLONOSCOPY  2014   JP-MAC-movi(exc)-poylpoid   POLYPECTOMY  2014   polypoid   WISDOM TOOTH EXTRACTION     Patient Active Problem List   Diagnosis Date Noted   Deviated nasal septum 11/18/2022   Hypertrophy of nasal turbinates 11/18/2022   Chronic maxillary sinusitis 11/18/2022   Polyp of nasal sinus 11/18/2022   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   Piriformis syndrome, right 11/03/2022   Cough variant asthma 10/19/2022   Chronic cough 05/04/2022   Hallux rigidus of both feet 07/15/2021   Costochondritis 03/20/2021   Seasonal allergies 09/09/2020   Postcoital UTI 09/09/2020   Strain of hamstring, subsequent  encounter 08/25/2016   Plantar fasciitis of right foot 05/28/2016   Acquired hallux limitus of left foot 05/28/2016   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  Pain today 1/10 unlesss I move a certain way   EVAL:  Pt has had R sided hip pain about 12 weeks ago.  She has been doing stretches that have helped a bit.  She is concerned that when her hip is extended it is hard to put wgt on it.  She has pain when lowering her leg from a step or ladder.  She feels like it will give way. She has pain when moving her leg into a position to stretch, but once she is there it spine.  She reports she has always  had tight hips.   Has been rolling and working it out.  She is very active and reports doing pickleball, golf, walking and Peloton workouts.  She has stopped biking a little bit.    PERTINENT HISTORY: Old hamstring tear  Gr toe OA bilateral   11/18.24: Dr Pearletha Forge visit   1)Piriformis syndrome: Improving with PT and home exercises.  Can get back to walking on flat surfaces gradually -- for the first week every other day and then daily.  Can continue biking and swimming depending on pain level.  Abstain from pickleball until follow-up.  Continue work with PT.   2.  Hamstring tendinopathy: As evidenced on exam and on ultrasound.  Likely contributing to pain.  Can continue exercises above.  Continue to work with PT. Add nitroglycerin patches to area of pain daily.    PAIN:  Are you having pain? Yes: NPRS scale: 1/10 Pain  location: Rt post hip, prox 1/3 hamstring .  Pain and tension along the right lateral thigh, ITB and right proximal hip as well  Pain description: tight Aggravating factors: Pain when changing positions of the leg like crossing it rolling over moving into a position. Relieving factors: tennis ball, stretching, has not tried heat or ice   Pain when active is moderate 5/10-6/10.   PRECAUTIONS: None  RED FLAGS: None   WEIGHT BEARING RESTRICTIONS: No  FALLS:  Has patient fallen in last 6 months? No  LIVING ENVIRONMENT: Lives with: lives with their family Lives in: House/apartment Stairs:  no issues  Has following equipment at home: None  OCCUPATION: not working   PLOF: Independent  PATIENT GOALS: Pt  would like to have less or no pain without limitation of pain . Get rid of braces.  (Helix)  NEXT MD VISIT: as needed   OBJECTIVE:  Note: Objective measures were completed at Evaluation unless otherwise noted.  DIAGNOSTIC FINDINGS: None performed recently  PATIENT SURVEYS:  FOTO 64 goal is 86 %  COGNITION: Overall cognitive status: Within functional limits for tasks assessed     SENSATION: WFL  EDEMA:    MUSCLE LENGTH: Hamstrings: Right 25 deg; Left 25 deg Min tight hip flexors in prone   POSTURE: decreased lumbar lordosis  PALPATION: Pain with palpation to right piriformis and proximal one third of the hamstring laterally.  No pain or tenderness in gluteus medius. TTP left posterior hip as well but not symptomatic.  Stiffness in lower lumbar spine  LOWER EXTREMITY ROM: WFL   Passive ROM Right eval Left eval  Hip flexion    Hip extension    Hip abduction    Hip adduction    Hip internal rotation    Hip external rotation    Knee flexion    Knee extension    Ankle dorsiflexion    Ankle plantarflexion    Ankle inversion    Ankle eversion     (Blank rows = not tested)  LOWER EXTREMITY MMT:  MMT Right eval Left eval  Hip flexion 4+ 4+  Hip  extension     Hip abduction 3+ 3+  Hip adduction    Hip internal rotation No pain  No pain   Hip external rotation Pain  No pain   Knee flexion 5 5  Knee extension 5 5  Ankle dorsiflexion 5 5  Ankle plantarflexion    Ankle inversion    Ankle eversion     (Blank rows = not tested)  LOWER EXTREMITY SPECIAL TESTS:  Hip special tests: NT  on eval   FUNCTIONAL TESTS:  Squat-able to squat without cues and no increased pain.  When asked to increase hip flexion with more of a hinge she did have some discomfort in the hip as well as the back Less functional strength shown with 8 inch step ups in the right lower extremity compared to the left GAIT: No issues    TODAY'S TREATMENT:          [facility] Adult PT Treatment:                                                DATE: 12/15/22 Therapeutic Exercise: Pilates Reformer used for LE/core strength, postural strength, lumbopelvic disassociation and core control.  Exercises included: Footwork 2 red 1 blue 1 yellow   Double leg Parallel heels, toes narrow and wide Pilates V heels and toes narrow and wide  2 red 1 blue  Single leg  Heel in parallel and turnout 2 red 1 blue  Bridging all springs articulating  x 10 then x 5 with 3 springs     Feet in Straps 1 red 1 yellow spring  Arcs, circles and squats                                                                                                                         [facility] Adult PT Treatment:                                                DATE: 12/11/22 Therapeutic Exercise: R piriformis stretch with towel 30s x2 R hip flexor stertch 30s x2 Prone on elbows 2 min Prone press 10x f/b 10 with PT OP Manual Therapy: Skilled palpation to identify taught and irritable bands in R piriformis as well as R proximal biceps femoris Trigger Point Dry Needling Treatment: Pre-treatment instruction: Patient instructed on dry needling rationale, procedures, and possible side effects including pain during  treatment (achy,cramping feeling), bruising, drop of blood, lightheadedness, nausea, sweating. Patient Consent Given: Yes Education handout provided: No Muscles treated: R piriformis (caudal fibers)  Needle size and number: .30x81mm x 1 Electrical stimulation performed: No Parameters: N/A Treatment response/outcome: Twitch response elicited and Palpable decrease in muscle tension Post-treatment instructions: Patient instructed to expect possible mild to moderate muscle soreness later today and/or tomorrow. Patient instructed in methods to reduce muscle soreness and to continue prescribed HEP. If patient was dry needled over the lung field, patient was instructed on signs and symptoms of pneumothorax and, however unlikely, to see immediate medical attention should they occur. Patient was also educated on signs and symptoms of infection and to seek medical attention should they occur. Patient verbalized understanding of these instructions and education.   [facility]  Adult PT Treatment:                                                DATE: 12/09/22 Therapeutic Exercise: Recumbent bike L 3, 5 min  Supine static hamstring and ITB  Dynamic hamstring stretch with towel Rt LE only, continuous and gentle  Bridge (glute)  BOSU hamstring bridge x 10 double  BOSU single  bridge x 10  HS bridge hold with march   Prone hamstring curl 15 lbs bilateral legs x 10 Prone hip extension x 15  Childs pose  Quadruped clam and hydrant x 15  Standing pigeon pose   [facility] Adult PT Treatment:                                                DATE: 11/30/22 Therapeutic Exercise: Hamstring, ITB stretch with strap x 3 , 30 sec  Side-lying banded hip abduction and clam, no band  Piriformis stretch  x 2 , 30 sec  Prone hamstring curl x 10 x 2 sets Prone hip extension 2 x 10  Childs pose  Cat and camel Hip circles in quadruped   Manual Therapy: IASTM, manual therapy to Rt piriformis and ITB and biceps femoris    TrP to Rt  biceps femoris, piriformis  Modalities: Cold pack 10 min supine glute and prox hamstring Self Care: Rec. ice 2-3 x per day   Modify activity   11/27/22 PT evaluation completed Streamlined home exercise program Discussed core stabilization and current fitness regime I asked her to pare down her routine a bit, get more focused and reduce stress, mentioned Pilates  She performed her new home exercise program fully see below   PATIENT EDUCATION:  Education details: See above Person educated: Patient Education method: Explanation, Demonstration, Verbal cues, and Handouts Education comprehension: verbalized understanding, returned demonstration, and needs further education  HOME EXERCISE PROGRAM:  [email or web address] Access Code: [email or web address] URL: [email or web address] Date: 11/30/2022 Prepared by: Karie Mainland  Exercises - Supine Piriformis Stretch  - 1 x daily - 7 x weekly - 3 sets - 10 reps - Supine Piriformis Stretch  - 2 x daily - 7 x weekly - 1 sets - 3 reps - 30 second hold - Sidelying Hip Abduction  - 1 x daily - 7 x weekly - 3 sets - 10 reps - Supine Hamstring Stretch with Strap  - 1 x daily - 7 x weekly - 1 sets - 3-5 reps - 30 hold - Supine ITB Stretch with Strap  - 1 x daily - 7 x weekly - 1 sets - 3-5 reps - 30 hold - Clamshell with Resistance  - 1 x daily - 7 x weekly - 2 sets - 10 reps - 5 hold   ASSESSMENT:  CLINICAL IMPRESSION: Patient continues to have Rt hip tightness and pain limiting her mobility.  She does report improvement with trigger point dry needling and hopes to have it again.  Introduced Land exercises today which revealed significant difference between left and right leg.  Patient had difficulty with right hip extension when biased for the biceps femoris.  Recommended she apply ice and continue with exercises.  OBJECTIVE IMPAIRMENTS: decreased mobility, decreased strength, increased fascial restrictions, impaired flexibility,  and pain.    ACTIVITY LIMITATIONS: squatting, transfers, and locomotion level  PARTICIPATION LIMITATIONS: community activity  PERSONAL FACTORS: Behavior pattern, Past/current experiences, and 1-2 comorbidities: Mild orthopedic injuries foot and shoulder  are also affecting patient's functional outcome.   REHAB POTENTIAL: Excellent  CLINICAL DECISION MAKING: Stable/uncomplicated  EVALUATION COMPLEXITY: Low   GOALS: Goals reviewed with patient? Yes  SHORT TERM GOALS= LONG TERM GOALS  Target date: 01/08/2023     Patient will be independent with home exercise program Baseline: Goal status:ongoing, up to date   2.  Patient will demonstrate improved perceived mobility and functional strength with step ups and step downs Baseline:  Goal status: ongoing   3.  Patient will be able to cross her right leg over the left with ease to improve lower body dressing Baseline:  Goal status: ongoing   4.  Patient will increase hip abduction strength to 4+/5 bilaterally Baseline:  Goal status: ongoing   5.  Patient will demonstrate proper squat mechanics without cues and without increased pain Baseline:  Goal status:ongoing   6.  Foto score will improve to 86% Baseline:  Goal status: INITIAL   PLAN:  PT FREQUENCY: 2x/week  PT DURATION: 6 weeks  PLANNED INTERVENTIONS: 97164- PT Re-evaluation, 97110-Therapeutic exercises, 97530- Therapeutic activity, 97112- Neuromuscular re-education, 97535- Self Care, 47829- Manual therapy, 97014- Electrical stimulation (unattended), 97033- Ionotophoresis 4mg /ml Dexamethasone, Patient/Family education, Dry Needling, Joint mobilization, Spinal mobilization, Cryotherapy, and Moist heat  PLAN FOR NEXT SESSION: hip abd/ext strength, manual therapy, trigger point dry needling to right piriformis right biceps femoris, Pilates reformer and core.   Brielle Moro, PT 12/15/2022, 12:35 PM   Karie Mainland, PT 12/15/22 12:35 PM Phone: [phone] Fax: [phone]

## 2022-12-15 ENCOUNTER — Encounter: Payer: Self-pay | Admitting: Physical Therapy

## 2022-12-15 ENCOUNTER — Ambulatory Visit: Payer: Managed Care, Other (non HMO) | Admitting: Physical Therapy

## 2022-12-15 DIAGNOSIS — M25551 Pain in right hip: Secondary | ICD-10-CM

## 2022-12-15 DIAGNOSIS — R252 Cramp and spasm: Secondary | ICD-10-CM

## 2022-12-15 DIAGNOSIS — M2560 Stiffness of unspecified joint, not elsewhere classified: Secondary | ICD-10-CM

## 2022-12-15 DIAGNOSIS — M6281 Muscle weakness (generalized): Secondary | ICD-10-CM

## 2022-12-16 ENCOUNTER — Ambulatory Visit: Payer: Managed Care, Other (non HMO) | Admitting: Sports Medicine

## 2022-12-16 ENCOUNTER — Ambulatory Visit: Payer: Managed Care, Other (non HMO) | Admitting: Internal Medicine

## 2022-12-21 ENCOUNTER — Ambulatory Visit: Payer: Managed Care, Other (non HMO) | Attending: Sports Medicine | Admitting: Physical Therapy

## 2022-12-21 ENCOUNTER — Encounter: Payer: Self-pay | Admitting: Physical Therapy

## 2022-12-21 DIAGNOSIS — M2560 Stiffness of unspecified joint, not elsewhere classified: Secondary | ICD-10-CM | POA: Diagnosis present

## 2022-12-21 DIAGNOSIS — M25551 Pain in right hip: Secondary | ICD-10-CM | POA: Diagnosis present

## 2022-12-21 DIAGNOSIS — R252 Cramp and spasm: Secondary | ICD-10-CM | POA: Diagnosis present

## 2022-12-21 DIAGNOSIS — M6281 Muscle weakness (generalized): Secondary | ICD-10-CM | POA: Diagnosis present

## 2022-12-21 NOTE — Therapy (Signed)
OUTPATIENT PHYSICAL THERAPY NOTE   Patient Name: Kimberly Stevens MRN: [id number] DOB:November 19, 1965, 57 y.o., female Today's Date: 12/21/2022  END OF SESSION:  PT End of Session - 12/21/22 0900     Visit Number 6    Number of Visits 12    Date for PT Re-Evaluation 01/08/23    Authorization Type [employer]    PT Start Time 0900   late arrival, mixed up appt time   PT Stop Time 0930    PT Time Calculation (min) 30 min    Activity Tolerance Patient tolerated treatment well    Behavior During Therapy [facility] for tasks assessed/performed                  Past Medical History:  Diagnosis Date   Allergy    seasonal allergies   Arthritis    left great toe joint   Broken arm    right elbow & wrist   Colon polyps    benign   Depression    hx of post partum   Dermoid cyst of ovary    Eye disorder    possible glaucoma-being followed by Dr Deatra James   Fibrocystic breast    IBS (irritable bowel syndrome)    Plantar fasciitis    left foot 2/15-11/15   Polyp of duodenum 2014   tiny   Tendonitis    right knee 2/15-11/15   Torn rotator cuff    right   Past Surgical History:  Procedure Laterality Date   BROW LIFT Bilateral 02/22/2020   Procedure: BLEPHAROPLASTY UPPER EYELID; W/EXCESS SKIN BILATERAL;  Surgeon: Imagene Riches, MD;  Location: [facility];  Service: Ophthalmology;  Laterality: Bilateral;   COLONOSCOPY  2014   JP-MAC-movi(exc)-poylpoid   POLYPECTOMY  2014   polypoid   WISDOM TOOTH EXTRACTION     Patient Active Problem List   Diagnosis Date Noted   Deviated nasal septum 11/18/2022   Hypertrophy of nasal turbinates 11/18/2022   Chronic maxillary sinusitis 11/18/2022   Polyp of nasal sinus 11/18/2022   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   Piriformis syndrome, right 11/03/2022   Cough variant asthma 10/19/2022   Chronic cough 05/04/2022   Hallux rigidus of both feet 07/15/2021   Costochondritis 03/20/2021   Seasonal allergies 09/09/2020   Postcoital UTI 09/09/2020    Strain of hamstring, subsequent encounter 08/25/2016   Plantar fasciitis of right foot 05/28/2016   Acquired hallux limitus of left foot 05/28/2016   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  Doing a lot better.  Tweaked knee walking last night.  I notice I clench a lot. She was really tired after the reformer.   EVAL:  Pt has had R sided hip pain about 12 weeks ago.  She has been doing stretches that have helped a bit.  She is concerned that when her hip is extended it is hard to put wgt on it.  She has pain when lowering her leg from a step or ladder.  She feels like it will give way. She has pain when moving  her leg into a position to stretch, but once she is there it spine.  She reports she has always had tight hips.   Has been rolling and working it out.  She is very active and reports doing pickleball, golf, walking and Peloton workouts.  She has stopped biking a little bit.    PERTINENT HISTORY: Old hamstring tear  Gr toe OA bilateral   11/18.24: Dr Pearletha Forge visit   1)Piriformis syndrome: Improving with PT and home exercises.  Can get back to walking on flat surfaces gradually -- for the first week every other day and then daily.  Can continue biking and swimming depending on pain level.  Abstain from pickleball until follow-up.  Continue work with PT.   2.  Hamstring tendinopathy: As evidenced on exam and on ultrasound.  Likely contributing to pain.  Can continue exercises above.  Continue to work with PT. Add nitroglycerin patches to  area of pain daily.    PAIN:  Are you having pain? Yes: NPRS scale: 1/10 Pain location: Rt post hip, prox 1/3 hamstring .  Pain and tension along the right lateral thigh, ITB and right proximal hip as well  Pain description: tight Aggravating factors: Pain when changing positions of the leg like crossing it rolling over moving into a position. Relieving factors: tennis ball, stretching, has not tried heat or ice   Pain when active is moderate 5/10-6/10.   PRECAUTIONS: None  RED FLAGS: None   WEIGHT BEARING RESTRICTIONS: No  FALLS:  Has patient fallen in last 6 months? No  LIVING ENVIRONMENT: Lives with: lives with their family Lives in: House/apartment Stairs:  no issues  Has following equipment at home: None  OCCUPATION: not working   PLOF: Independent  PATIENT GOALS: Pt  would like to have less or no pain without limitation of pain . Get rid of braces.  (Helix)  NEXT MD VISIT: as needed   OBJECTIVE:  Note: Objective measures were completed at Evaluation unless otherwise noted.  DIAGNOSTIC FINDINGS: None performed recently  PATIENT SURVEYS:  FOTO 64 goal is 86 %  COGNITION: Overall cognitive status: Within functional limits for tasks assessed     SENSATION: WFL  EDEMA:    MUSCLE LENGTH: Hamstrings: Right 25 deg; Left 25 deg Min tight hip flexors in prone   POSTURE: decreased lumbar lordosis  PALPATION: Pain with palpation to right piriformis and proximal one third of the hamstring laterally.  No pain or tenderness in gluteus medius. TTP left posterior hip as well but not symptomatic.  Stiffness in lower lumbar spine  LOWER EXTREMITY ROM: WFL   Passive ROM Right eval Left eval  Hip flexion    Hip extension    Hip abduction    Hip adduction    Hip internal rotation    Hip external rotation    Knee flexion    Knee extension    Ankle dorsiflexion    Ankle plantarflexion    Ankle inversion    Ankle eversion     (Blank rows = not  tested)  LOWER EXTREMITY MMT:  MMT Right eval Left eval  Hip flexion 4+ 4+  Hip extension     Hip abduction 3+ 3+  Hip adduction    Hip internal rotation No pain  No pain   Hip external rotation Pain  No pain   Knee flexion 5 5  Knee extension 5 5  Ankle dorsiflexion 5 5  Ankle plantarflexion    Ankle inversion    Ankle  eversion     (Blank rows = not tested)  LOWER EXTREMITY SPECIAL TESTS:  Hip special tests: NT on eval   FUNCTIONAL TESTS:  Squat-able to squat without cues and no increased pain.  When asked to increase hip flexion with more of a hinge she did have some discomfort in the hip as well as the back Less functional strength shown with 8 inch step ups in the right lower extremity compared to the left GAIT: No issues    TODAY'S TREATMENT:         [facility] Adult PT Treatment:                                                DATE: 12/21/22 Therapeutic Exercise: Bridging with ball x 10  Single leg bridge x 10 each  Hamstring kicks, gentle release  Lower abdominals hold with ball under pelvis Reverse toe taps with ball  Lumbar twist, knee to chest with rotation  Sidelying hip abduction 2 lbs  x 1 x 15 lbs , sidekicks x 10  Hip adduction x 15   Footwork 2 red 1 Green : heels, toes in hip ER double leg  Footwork single leg 2 red 1 blue added hip circle to top leg x 10  Footwork single leg hamstring pull 1 red 1 blue  Rt LE hamstring, ITB stretch with strap, opp leg in well   [facility] Adult PT Treatment:                                                DATE: 12/15/22 Therapeutic Exercise: Pilates Reformer used for LE/core strength, postural strength, lumbopelvic disassociation and core control.  Exercises included: Footwork 2 red 1 blue 1 yellow   Double leg Parallel heels, toes narrow and wide Pilates V heels and toes narrow and wide  2 red 1 blue  Single leg  Heel in parallel and turnout 2 red 1 blue  Bridging all springs articulating  x 10 then x 5 with 3 springs      Feet in Straps 1 red 1 yellow spring  Arcs, circles and squats                                                                                                                         [facility] Adult PT Treatment:                                                DATE: 12/11/22 Therapeutic Exercise: R piriformis stretch with towel 30s x2 R hip flexor stertch 30s x2 Prone on  elbows 2 min Prone press 10x f/b 10 with PT OP Manual Therapy: Skilled palpation to identify taught and irritable bands in R piriformis as well as R proximal biceps femoris Trigger Point Dry Needling Treatment: Pre-treatment instruction: Patient instructed on dry needling rationale, procedures, and possible side effects including pain during treatment (achy,cramping feeling), bruising, drop of blood, lightheadedness, nausea, sweating. Patient Consent Given: Yes Education handout provided: No Muscles treated: R piriformis (caudal fibers)  Needle size and number: .30x85mm x 1 Electrical stimulation performed: No Parameters: N/A Treatment response/outcome: Twitch response elicited and Palpable decrease in muscle tension Post-treatment instructions: Patient instructed to expect possible mild to moderate muscle soreness later today and/or tomorrow. Patient instructed in methods to reduce muscle soreness and to continue prescribed HEP. If patient was dry needled over the lung field, patient was instructed on signs and symptoms of pneumothorax and, however unlikely, to see immediate medical attention should they occur. Patient was also educated on signs and symptoms of infection and to seek medical attention should they occur. Patient verbalized understanding of these instructions and education.   [facility] Adult PT Treatment:                                                DATE: 12/09/22 Therapeutic Exercise: Recumbent bike L 3, 5 min  Supine static hamstring and ITB  Dynamic hamstring stretch with towel Rt LE only, continuous and  gentle  Bridge (glute)  BOSU hamstring bridge x 10 double  BOSU single  bridge x 10  HS bridge hold with march   Prone hamstring curl 15 lbs bilateral legs x 10 Prone hip extension x 15  Childs pose  Quadruped clam and hydrant x 15  Standing pigeon pose   [facility] Adult PT Treatment:                                                DATE: 11/30/22 Therapeutic Exercise: Hamstring, ITB stretch with strap x 3 , 30 sec  Side-lying banded hip abduction and clam, no band  Piriformis stretch  x 2 , 30 sec  Prone hamstring curl x 10 x 2 sets Prone hip extension 2 x 10  Childs pose  Cat and camel Hip circles in quadruped   Manual Therapy: IASTM, manual therapy to Rt piriformis and ITB and biceps femoris    TrP to Rt biceps femoris, piriformis  Modalities: Cold pack 10 min supine glute and prox hamstring Self Care: Rec. ice 2-3 x per day   Modify activity   11/27/22 PT evaluation completed Streamlined home exercise program Discussed core stabilization and current fitness regime I asked her to pare down her routine a bit, get more focused and reduce stress, mentioned Pilates  She performed her new home exercise program fully see below   PATIENT EDUCATION:  Education details: See above Person educated: Patient Education method: Explanation, Demonstration, Verbal cues, and Handouts Education comprehension: verbalized understanding, returned demonstration, and needs further education  HOME EXERCISE PROGRAM:  [email or web address] Access Code: [email or web address] URL: [email or web address] Date: 11/30/2022 Prepared by: Karie Mainland  Exercises - Supine Piriformis Stretch  - 1 x daily - 7 x weekly - 3 sets - 10 reps - Supine Piriformis Stretch  - 2 x  daily - 7 x weekly - 1 sets - 3 reps - 30 second hold - Sidelying Hip Abduction  - 1 x daily - 7 x weekly - 3 sets - 10 reps - Supine Hamstring Stretch with Strap  - 1 x daily - 7 x weekly - 1 sets - 3-5 reps - 30 hold - Supine ITB Stretch with  Strap  - 1 x daily - 7 x weekly - 1 sets - 3-5 reps - 30 hold - Clamshell with Resistance  - 1 x daily - 7 x weekly - 2 sets - 10 reps - 5 hold   ASSESSMENT:  CLINICAL IMPRESSION: Pt is doing much better, noting increased ability to walk and be comfortable sleeping, with ADLs. She feels like the dry needling and reformer were key in isolating the problem area.  She will cont POC and benefit from 1-2 more dry needling sessions in the coming weeks.   OBJECTIVE IMPAIRMENTS: decreased mobility, decreased strength, increased fascial restrictions, impaired flexibility, and pain.   ACTIVITY LIMITATIONS: squatting, transfers, and locomotion level  PARTICIPATION LIMITATIONS: community activity  PERSONAL FACTORS: Behavior pattern, Past/current experiences, and 1-2 comorbidities: Mild orthopedic injuries foot and shoulder  are also affecting patient's functional outcome.   REHAB POTENTIAL: Excellent  CLINICAL DECISION MAKING: Stable/uncomplicated  EVALUATION COMPLEXITY: Low   GOALS: Goals reviewed with patient? Yes  SHORT TERM GOALS= LONG TERM GOALS  Target date: 01/08/2023     Patient will be independent with home exercise program Baseline: Goal status:ongoing, up to date   2.  Patient will demonstrate improved perceived mobility and functional strength with step ups and step downs Baseline:  Goal status: ongoing   3.  Patient will be able to cross her right leg over the left with ease to improve lower body dressing Baseline:  Goal status: ongoing   4.  Patient will increase hip abduction strength to 4+/5 bilaterally Baseline:  Goal status: ongoing   5.  Patient will demonstrate proper squat mechanics without cues and without increased pain Baseline:  Goal status:ongoing   6.  Foto score will improve to 86% Baseline:  Goal status: INITIAL   PLAN:  PT FREQUENCY: 2x/week  PT DURATION: 6 weeks  PLANNED INTERVENTIONS: 97164- PT Re-evaluation, 97110-Therapeutic  exercises, 97530- Therapeutic activity, 97112- Neuromuscular re-education, 97535- Self Care, 16109- Manual therapy, 97014- Electrical stimulation (unattended), 97033- Ionotophoresis 4mg /ml Dexamethasone, Patient/Family education, Dry Needling, Joint mobilization, Spinal mobilization, Cryotherapy, and Moist heat  PLAN FOR NEXT SESSION: hip abd/ext strength, manual therapy, trigger point dry needling to right piriformis right biceps femoris, Pilates reformer and core.   Zeph Riebel, PT 12/21/2022, 10:22 AM   Karie Mainland, PT 12/21/22 10:22 AM Phone: [phone] Fax: [phone]

## 2022-12-22 ENCOUNTER — Encounter: Payer: Self-pay | Admitting: Family Medicine

## 2022-12-23 NOTE — Therapy (Unsigned)
OUTPATIENT PHYSICAL THERAPY NOTE   Patient Name: Kimberly Stevens MRN: [id number] DOB:10-24-1965, 57 y.o., female Today's Date: 12/23/2022  END OF SESSION:         Past Medical History:  Diagnosis Date   Allergy    seasonal allergies   Arthritis    left great toe joint   Broken arm    right elbow & wrist   Colon polyps    benign   Depression    hx of post partum   Dermoid cyst of ovary    Eye disorder    possible glaucoma-being followed by Dr Deatra James   Fibrocystic breast    IBS (irritable bowel syndrome)    Plantar fasciitis    left foot 2/15-11/15   Polyp of duodenum 2014   tiny   Tendonitis    right knee 2/15-11/15   Torn rotator cuff    right   Past Surgical History:  Procedure Laterality Date   BROW LIFT Bilateral 02/22/2020   Procedure: BLEPHAROPLASTY UPPER EYELID; W/EXCESS SKIN BILATERAL;  Surgeon: Imagene Riches, MD;  Location: [facility];  Service: Ophthalmology;  Laterality: Bilateral;   COLONOSCOPY  2014   JP-MAC-movi(exc)-poylpoid   POLYPECTOMY  2014   polypoid   WISDOM TOOTH EXTRACTION     Patient Active Problem List   Diagnosis Date Noted   Deviated nasal septum 11/18/2022   Hypertrophy of nasal turbinates 11/18/2022   Chronic maxillary sinusitis 11/18/2022   Polyp of nasal sinus 11/18/2022   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   Piriformis syndrome, right 11/03/2022   Cough variant asthma 10/19/2022   Chronic cough 05/04/2022   Hallux rigidus of both feet 07/15/2021   Costochondritis 03/20/2021   Seasonal allergies 09/09/2020   Postcoital UTI 09/09/2020   Strain of hamstring, subsequent encounter 08/25/2016   Plantar fasciitis of right foot 05/28/2016   Acquired hallux limitus of left foot 05/28/2016   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  Rationale for Evaluation and Treatment: Rehabilitation  ONSET DATE: 12 weeks   SUBJECTIVE:   SUBJECTIVE STATEMENT: Pain is 1/10.  Doing a lot better.  Tweaked knee walking last night.  I notice I clench a lot. She was really tired after the reformer.   EVAL:  Pt has had R sided hip pain about 12 weeks ago.  She has been doing stretches that have helped a bit.  She is concerned that when her hip is extended it is hard to put wgt on it.  She has pain when lowering her leg from a step or ladder.  She feels like it will give way. She has pain when moving her leg into a position to stretch, but once she is there it spine.  She reports she has always had tight hips.   Has been rolling and working it out.  She is very active and reports doing pickleball, golf, walking and Peloton workouts.  She has stopped biking a little bit.    PERTINENT HISTORY: Old hamstring tear  Gr toe OA bilateral   11/18.24: Dr Pearletha Forge visit   1)Piriformis syndrome: Improving with PT and home exercises.  Can get back to walking on flat surfaces gradually -- for the first week every other day and then daily.  Can continue biking  and swimming depending on pain level.  Abstain from pickleball until follow-up.  Continue work with PT.   2.  Hamstring tendinopathy: As evidenced on exam and on ultrasound.  Likely contributing to pain.  Can continue exercises above.  Continue to work with PT. Add nitroglycerin patches to area of pain daily.    PAIN:  Are you having pain? Yes: NPRS scale: 1/10 Pain location: Rt post hip, prox 1/3 hamstring .  Pain and tension along the right lateral thigh, ITB and right proximal hip as well  Pain description: tight Aggravating factors: Pain when changing positions of the leg like crossing it rolling over moving into a position. Relieving factors: tennis ball, stretching, has not tried heat or ice   Pain when active is moderate 5/10-6/10.    PRECAUTIONS: None  RED FLAGS: None   WEIGHT BEARING RESTRICTIONS: No  FALLS:  Has patient fallen in last 6 months? No  LIVING ENVIRONMENT: Lives with: lives with their family Lives in: House/apartment Stairs:  no issues  Has following equipment at home: None  OCCUPATION: not working   PLOF: Independent  PATIENT GOALS: Pt  would like to have less or no pain without limitation of pain . Get rid of braces.  (Helix)  NEXT MD VISIT: as needed   OBJECTIVE:  Note: Objective measures were completed at Evaluation unless otherwise noted.  DIAGNOSTIC FINDINGS: None performed recently  PATIENT SURVEYS:  FOTO 64 goal is 86 %  COGNITION: Overall cognitive status: Within functional limits for tasks assessed     SENSATION: WFL  EDEMA:    MUSCLE LENGTH: Hamstrings: Right 25 deg; Left 25 deg Min tight hip flexors in prone   POSTURE: decreased lumbar lordosis  PALPATION: Pain with palpation to right piriformis and proximal one third of the hamstring laterally.  No pain or tenderness in gluteus medius. TTP left posterior hip as well but not symptomatic.  Stiffness in lower lumbar spine  LOWER EXTREMITY ROM: WFL   Passive ROM Right eval Left eval  Hip flexion    Hip extension    Hip abduction    Hip adduction    Hip internal rotation    Hip external rotation    Knee flexion    Knee extension    Ankle dorsiflexion    Ankle plantarflexion    Ankle inversion    Ankle eversion     (Blank rows = not tested)  LOWER EXTREMITY MMT:  MMT Right eval Left eval  Hip flexion 4+ 4+  Hip extension     Hip abduction 3+ 3+  Hip adduction    Hip internal rotation No pain  No pain   Hip external rotation Pain  No pain   Knee flexion 5 5  Knee extension 5 5  Ankle dorsiflexion 5 5  Ankle plantarflexion    Ankle inversion    Ankle eversion     (Blank rows = not tested)  LOWER EXTREMITY SPECIAL TESTS:  Hip special tests: NT on eval   FUNCTIONAL TESTS:  Squat-able  to squat without cues and no increased pain.  When asked to increase hip flexion with more of a hinge she did have some discomfort in the hip as well as the back Less functional strength shown with 8 inch step ups in the right lower extremity compared to the left GAIT: No issues    TODAY'S TREATMENT:         [facility] Adult PT Treatment:  DATE: 12/21/22 Therapeutic Exercise: Bridging with ball x 10  Single leg bridge x 10 each  Hamstring kicks, gentle release  Lower abdominals hold with ball under pelvis Reverse toe taps with ball  Lumbar twist, knee to chest with rotation  Sidelying hip abduction 2 lbs  x 1 x 15 lbs , sidekicks x 10  Hip adduction x 15   Footwork 2 red 1 Green : heels, toes in hip ER double leg  Footwork single leg 2 red 1 blue added hip circle to top leg x 10  Footwork single leg hamstring pull 1 red 1 blue  Rt LE hamstring, ITB stretch with strap, opp leg in well   [facility] Adult PT Treatment:                                                DATE: 12/15/22 Therapeutic Exercise: Pilates Reformer used for LE/core strength, postural strength, lumbopelvic disassociation and core control.  Exercises included: Footwork 2 red 1 blue 1 yellow   Double leg Parallel heels, toes narrow and wide Pilates V heels and toes narrow and wide  2 red 1 blue  Single leg  Heel in parallel and turnout 2 red 1 blue  Bridging all springs articulating  x 10 then x 5 with 3 springs     Feet in Straps 1 red 1 yellow spring  Arcs, circles and squats                                                                                                                         [facility] Adult PT Treatment:                                                DATE: 12/11/22 Therapeutic Exercise: R piriformis stretch with towel 30s x2 R hip flexor stertch 30s x2 Prone on elbows 2 min Prone press 10x f/b 10 with PT OP Manual Therapy: Skilled palpation to identify  taught and irritable bands in R piriformis as well as R proximal biceps femoris Trigger Point Dry Needling Treatment: Pre-treatment instruction: Patient instructed on dry needling rationale, procedures, and possible side effects including pain during treatment (achy,cramping feeling), bruising, drop of blood, lightheadedness, nausea, sweating. Patient Consent Given: Yes Education handout provided: No Muscles treated: R piriformis (caudal fibers)  Needle size and number: .30x40mm x 1 Electrical stimulation performed: No Parameters: N/A Treatment response/outcome: Twitch response elicited and Palpable decrease in muscle tension Post-treatment instructions: Patient instructed to expect possible mild to moderate muscle soreness later today and/or tomorrow. Patient instructed in methods to reduce muscle soreness and to continue prescribed HEP. If patient was dry needled over the lung field, patient was instructed on signs and symptoms  of pneumothorax and, however unlikely, to see immediate medical attention should they occur. Patient was also educated on signs and symptoms of infection and to seek medical attention should they occur. Patient verbalized understanding of these instructions and education.   [facility] Adult PT Treatment:                                                DATE: 12/09/22 Therapeutic Exercise: Recumbent bike L 3, 5 min  Supine static hamstring and ITB  Dynamic hamstring stretch with towel Rt LE only, continuous and gentle  Bridge (glute)  BOSU hamstring bridge x 10 double  BOSU single  bridge x 10  HS bridge hold with march   Prone hamstring curl 15 lbs bilateral legs x 10 Prone hip extension x 15  Childs pose  Quadruped clam and hydrant x 15  Standing pigeon pose   [facility] Adult PT Treatment:                                                DATE: 11/30/22 Therapeutic Exercise: Hamstring, ITB stretch with strap x 3 , 30 sec  Side-lying banded hip abduction and clam, no band   Piriformis stretch  x 2 , 30 sec  Prone hamstring curl x 10 x 2 sets Prone hip extension 2 x 10  Childs pose  Cat and camel Hip circles in quadruped   Manual Therapy: IASTM, manual therapy to Rt piriformis and ITB and biceps femoris    TrP to Rt biceps femoris, piriformis  Modalities: Cold pack 10 min supine glute and prox hamstring Self Care: Rec. ice 2-3 x per day   Modify activity   11/27/22 PT evaluation completed Streamlined home exercise program Discussed core stabilization and current fitness regime I asked her to pare down her routine a bit, get more focused and reduce stress, mentioned Pilates  She performed her new home exercise program fully see below   PATIENT EDUCATION:  Education details: See above Person educated: Patient Education method: Explanation, Demonstration, Verbal cues, and Handouts Education comprehension: verbalized understanding, returned demonstration, and needs further education  HOME EXERCISE PROGRAM:  [email or web address] Access Code: [email or web address] URL: [email or web address] Date: 11/30/2022 Prepared by: Karie Mainland  Exercises - Supine Piriformis Stretch  - 1 x daily - 7 x weekly - 3 sets - 10 reps - Supine Piriformis Stretch  - 2 x daily - 7 x weekly - 1 sets - 3 reps - 30 second hold - Sidelying Hip Abduction  - 1 x daily - 7 x weekly - 3 sets - 10 reps - Supine Hamstring Stretch with Strap  - 1 x daily - 7 x weekly - 1 sets - 3-5 reps - 30 hold - Supine ITB Stretch with Strap  - 1 x daily - 7 x weekly - 1 sets - 3-5 reps - 30 hold - Clamshell with Resistance  - 1 x daily - 7 x weekly - 2 sets - 10 reps - 5 hold   ASSESSMENT:  CLINICAL IMPRESSION: Pt is doing much better, noting increased ability to walk and be comfortable sleeping, with ADLs. She feels like the dry needling and reformer were key in isolating the problem area.  She will cont POC  and benefit from 1-2 more dry needling sessions in the coming weeks.   OBJECTIVE  IMPAIRMENTS: decreased mobility, decreased strength, increased fascial restrictions, impaired flexibility, and pain.   ACTIVITY LIMITATIONS: squatting, transfers, and locomotion level  PARTICIPATION LIMITATIONS: community activity  PERSONAL FACTORS: Behavior pattern, Past/current experiences, and 1-2 comorbidities: Mild orthopedic injuries foot and shoulder  are also affecting patient's functional outcome.   REHAB POTENTIAL: Excellent  CLINICAL DECISION MAKING: Stable/uncomplicated  EVALUATION COMPLEXITY: Low   GOALS: Goals reviewed with patient? Yes  SHORT TERM GOALS= LONG TERM GOALS  Target date: 01/08/2023     Patient will be independent with home exercise program Baseline: Goal status:ongoing, up to date   2.  Patient will demonstrate improved perceived mobility and functional strength with step ups and step downs Baseline:  Goal status: ongoing   3.  Patient will be able to cross her right leg over the left with ease to improve lower body dressing Baseline:  Goal status: ongoing   4.  Patient will increase hip abduction strength to 4+/5 bilaterally Baseline:  Goal status: ongoing   5.  Patient will demonstrate proper squat mechanics without cues and without increased pain Baseline:  Goal status:ongoing   6.  Foto score will improve to 86% Baseline:  Goal status: INITIAL   PLAN:  PT FREQUENCY: 2x/week  PT DURATION: 6 weeks  PLANNED INTERVENTIONS: 97164- PT Re-evaluation, 97110-Therapeutic exercises, 97530- Therapeutic activity, 97112- Neuromuscular re-education, 97535- Self Care, 16109- Manual therapy, 97014- Electrical stimulation (unattended), 97033- Ionotophoresis 4mg /ml Dexamethasone, Patient/Family education, Dry Needling, Joint mobilization, Spinal mobilization, Cryotherapy, and Moist heat  PLAN FOR NEXT SESSION: hip abd/ext strength, manual therapy, trigger point dry needling to right piriformis right biceps femoris, Pilates reformer and  core.   Hildred Laser, PT 12/23/2022, 1:12 PM   Karie Mainland, PT 12/23/22 1:12 PM Phone: [phone] Fax: [phone]

## 2022-12-25 ENCOUNTER — Ambulatory Visit: Payer: Managed Care, Other (non HMO)

## 2022-12-25 ENCOUNTER — Encounter: Payer: Self-pay | Admitting: Family Medicine

## 2022-12-25 ENCOUNTER — Ambulatory Visit: Payer: Managed Care, Other (non HMO) | Admitting: Family Medicine

## 2022-12-25 VITALS — BP 123/84 | HR 64 | Temp 97.6°F | Resp 18 | Ht 64.0 in | Wt 133.1 lb

## 2022-12-25 DIAGNOSIS — J0191 Acute recurrent sinusitis, unspecified: Secondary | ICD-10-CM | POA: Diagnosis not present

## 2022-12-25 LAB — POC COVID19 BINAXNOW: SARS Coronavirus 2 Ag: NEGATIVE

## 2022-12-25 MED ORDER — PREDNISONE 20 MG PO TABS: 40.0000 mg | ORAL_TABLET | Freq: Every day | ORAL | 0 refills | Status: AC

## 2022-12-25 MED ORDER — AMOXICILLIN-POT CLAVULANATE 875-125 MG PO TABS: 1.0000 | ORAL_TABLET | Freq: Two times a day (BID) | ORAL | 0 refills | Status: DC

## 2022-12-25 NOTE — Patient Instructions (Addendum)
Sent meds Drink plenty of water, etc.   Worse, etc, ER.   Happy holidays!

## 2022-12-25 NOTE — Progress Notes (Signed)
Subjective:     Patient ID: Kimberly Stevens, female    DOB: 1965/12/23, 57 y.o.   MRN: [id number]  Chief Complaint  Patient presents with   Cough    Persistent cough with green mucus that started about 4 or 5 days ago    Fatigue   Headache    Really bad sinus headache, took Sudafed at 8 am and tylenol at noon, still has headache   Nasal Congestion   Poor appetite    Haven't taking any gerd medications because she hasn't been eating    HPI Above for 4-5 days.  A lot of mucus.  Nausea.  No appetite.  Staying in bed. Chills but no fever.  Today, chest tight.  Not used inhaler yet.    Getting sugery end of month for sinuses.  Teeth hurt.   There are no preventive care reminders to display for this patient.  Past Medical History:  Diagnosis Date   Allergy    seasonal allergies   Arthritis    left great toe joint   Broken arm    right elbow & wrist   Colon polyps    benign   Depression    hx of post partum   Dermoid cyst of ovary    Eye disorder    possible glaucoma-being followed by Dr Deatra James   Fibrocystic breast    IBS (irritable bowel syndrome)    Plantar fasciitis    left foot 2/15-11/15   Polyp of duodenum 2014   tiny   Tendonitis    right knee 2/15-11/15   Torn rotator cuff    right    Past Surgical History:  Procedure Laterality Date   BROW LIFT Bilateral 02/22/2020   Procedure: BLEPHAROPLASTY UPPER EYELID; W/EXCESS SKIN BILATERAL;  Surgeon: Imagene Riches, MD;  Location: [facility];  Service: Ophthalmology;  Laterality: Bilateral;   COLONOSCOPY  2014   JP-MAC-movi(exc)-poylpoid   POLYPECTOMY  2014   polypoid   WISDOM TOOTH EXTRACTION       Current Outpatient Medications:    Albuterol-Budesonide (AIRSUPRA) 90-80 MCG/ACT AERO, Inhale 2 puffs into the lungs every 4 (four) hours as needed., Disp: 10.7 g, Rfl: 0   famotidine (PEPCID) 20 MG tablet, Take 20 mg by mouth 2 (two) times daily., Disp: , Rfl:    Fexofenadine HCl (ALLEGRA PO), Take by  mouth., Disp: , Rfl:    omeprazole (PRILOSEC OTC) 20 MG tablet, Take 20 mg by mouth., Disp: , Rfl:    predniSONE (DELTASONE) 20 MG tablet, Take 2 tablets (40 mg total) by mouth daily with breakfast for 5 days., Disp: 10 tablet, Rfl: 0   amoxicillin-clavulanate (AUGMENTIN) 875-125 MG tablet, Take 1 tablet by mouth 2 (two) times daily., Disp: 20 tablet, Rfl: 0  Allergies  Allergen Reactions   Gluten Meal Diarrhea   Lactose Intolerance (Gi)    ROS neg/noncontributory except as noted HPI/below      Objective:     BP 123/84   Pulse 64   Temp 97.6 F (36.4 C) (Temporal)   Resp 18   Ht 5\' 4"  (1.626 m)   Wt 133 lb 2 oz (60.4 kg)   LMP 06/20/2017 (Approximate)   SpO2 96%   BMI 22.85 kg/m  Wt Readings from Last 3 Encounters:  12/25/22 133 lb 2 oz (60.4 kg)  12/07/22 131 lb (59.4 kg)  11/17/22 132 lb (59.9 kg)    Physical Exam   Gen: WDWN NAD. Mild/mod ill.  Non toxic HEENT:  NCAT, conjunctiva not injected, sclera nonicteric TM WNL B-some wax OP moist, no exudates  NECK:  supple, no thyromegaly, no nodes, no carotid bruits CARDIAC: RRR, S1S2+, no murmur.  LUNGS: CTAB. No wheezes EXT:  no edema MSK: no gross abnormalities.  NEURO: A&O x3.  CN II-XII intact.  PSYCH: normal mood. Good eye contact  Results for orders placed or performed in visit on 12/25/22  POC COVID-19  Result Value Ref Range   SARS Coronavirus 2 Ag Negative Negative        Assessment & Plan:  Acute recurrent sinusitis, unspecified location  Other orders -     predniSONE; Take 2 tablets (40 mg total) by mouth daily with breakfast for 5 days.  Dispense: 10 tablet; Refill: 0 -     Amoxicillin-Pot Clavulanate; Take 1 tablet by mouth 2 (two) times daily.  Dispense: 20 tablet; Refill: 0   Sinusitis-augmentin bid, pred 40mg  daily, use inhaler tid prn.    Return if symptoms worsen or fail to improve.  Angelena Sole, MD

## 2022-12-28 NOTE — Therapy (Unsigned)
OUTPATIENT PHYSICAL THERAPY NOTE   Patient Name: Kimberly Stevens MRN: [id number] DOB:07/29/1965, 57 y.o., female Today's Date: 12/28/2022  END OF SESSION:         Past Medical History:  Diagnosis Date   Allergy    seasonal allergies   Arthritis    left great toe joint   Broken arm    right elbow & wrist   Colon polyps    benign   Depression    hx of post partum   Dermoid cyst of ovary    Eye disorder    possible glaucoma-being followed by Dr Deatra James   Fibrocystic breast    IBS (irritable bowel syndrome)    Plantar fasciitis    left foot 2/15-11/15   Polyp of duodenum 2014   tiny   Tendonitis    right knee 2/15-11/15   Torn rotator cuff    right   Past Surgical History:  Procedure Laterality Date   BROW LIFT Bilateral 02/22/2020   Procedure: BLEPHAROPLASTY UPPER EYELID; W/EXCESS SKIN BILATERAL;  Surgeon: Imagene Riches, MD;  Location: [facility];  Service: Ophthalmology;  Laterality: Bilateral;   COLONOSCOPY  2014   JP-MAC-movi(exc)-poylpoid   POLYPECTOMY  2014   polypoid   WISDOM TOOTH EXTRACTION     Patient Active Problem List   Diagnosis Date Noted   Deviated nasal septum 11/18/2022   Hypertrophy of nasal turbinates 11/18/2022   Chronic maxillary sinusitis 11/18/2022   Polyp of nasal sinus 11/18/2022   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   Piriformis syndrome, right 11/03/2022   Cough variant asthma 10/19/2022   Chronic cough 05/04/2022   Hallux rigidus of both feet 07/15/2021   Costochondritis 03/20/2021   Seasonal allergies 09/09/2020   Postcoital UTI 09/09/2020   Strain of hamstring, subsequent encounter 08/25/2016   Plantar fasciitis of right foot 05/28/2016   Acquired hallux limitus of left foot 05/28/2016   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  Rationale for Evaluation and Treatment: Rehabilitation  ONSET DATE: 12 weeks   SUBJECTIVE:   SUBJECTIVE STATEMENT: Pain is 1/10.  Doing a lot better.  Tweaked knee walking last night.  I notice I clench a lot. She was really tired after the reformer.   EVAL:  Pt has had R sided hip pain about 12 weeks ago.  She has been doing stretches that have helped a bit.  She is concerned that when her hip is extended it is hard to put wgt on it.  She has pain when lowering her leg from a step or ladder.  She feels like it will give way. She has pain when moving her leg into a position to stretch, but once she is there it spine.  She reports she has always had tight hips.   Has been rolling and working it out.  She is very active and reports doing pickleball, golf, walking and Peloton workouts.  She has stopped biking a little bit.    PERTINENT HISTORY: Old hamstring tear  Gr toe OA bilateral   11/18.24: Dr Pearletha Forge visit   1)Piriformis syndrome: Improving with PT and home exercises.  Can get back to walking on flat surfaces gradually -- for the first week every other day and then daily.  Can continue biking  and swimming depending on pain level.  Abstain from pickleball until follow-up.  Continue work with PT.   2.  Hamstring tendinopathy: As evidenced on exam and on ultrasound.  Likely contributing to pain.  Can continue exercises above.  Continue to work with PT. Add nitroglycerin patches to area of pain daily.    PAIN:  Are you having pain? Yes: NPRS scale: 1/10 Pain location: Rt post hip, prox 1/3 hamstring .  Pain and tension along the right lateral thigh, ITB and right proximal hip as well  Pain description: tight Aggravating factors: Pain when changing positions of the leg like crossing it rolling over moving into a position. Relieving factors: tennis ball, stretching, has not tried heat or ice   Pain when active is moderate 5/10-6/10.    PRECAUTIONS: None  RED FLAGS: None   WEIGHT BEARING RESTRICTIONS: No  FALLS:  Has patient fallen in last 6 months? No  LIVING ENVIRONMENT: Lives with: lives with their family Lives in: House/apartment Stairs:  no issues  Has following equipment at home: None  OCCUPATION: not working   PLOF: Independent  PATIENT GOALS: Pt  would like to have less or no pain without limitation of pain . Get rid of braces.  (Helix)  NEXT MD VISIT: as needed   OBJECTIVE:  Note: Objective measures were completed at Evaluation unless otherwise noted.  DIAGNOSTIC FINDINGS: None performed recently  PATIENT SURVEYS:  FOTO 64 goal is 86 %  COGNITION: Overall cognitive status: Within functional limits for tasks assessed     SENSATION: WFL  EDEMA:    MUSCLE LENGTH: Hamstrings: Right 25 deg; Left 25 deg Min tight hip flexors in prone   POSTURE: decreased lumbar lordosis  PALPATION: Pain with palpation to right piriformis and proximal one third of the hamstring laterally.  No pain or tenderness in gluteus medius. TTP left posterior hip as well but not symptomatic.  Stiffness in lower lumbar spine  LOWER EXTREMITY ROM: WFL   Passive ROM Right eval Left eval  Hip flexion    Hip extension    Hip abduction    Hip adduction    Hip internal rotation    Hip external rotation    Knee flexion    Knee extension    Ankle dorsiflexion    Ankle plantarflexion    Ankle inversion    Ankle eversion     (Blank rows = not tested)  LOWER EXTREMITY MMT:  MMT Right eval Left eval  Hip flexion 4+ 4+  Hip extension     Hip abduction 3+ 3+  Hip adduction    Hip internal rotation No pain  No pain   Hip external rotation Pain  No pain   Knee flexion 5 5  Knee extension 5 5  Ankle dorsiflexion 5 5  Ankle plantarflexion    Ankle inversion    Ankle eversion     (Blank rows = not tested)  LOWER EXTREMITY SPECIAL TESTS:  Hip special tests: NT on eval   FUNCTIONAL TESTS:  Squat-able  to squat without cues and no increased pain.  When asked to increase hip flexion with more of a hinge she did have some discomfort in the hip as well as the back Less functional strength shown with 8 inch step ups in the right lower extremity compared to the left GAIT: No issues    TODAY'S TREATMENT:         [facility] Adult PT Treatment:  DATE: 12/29/22 Therapeutic Exercise: *** Manual Therapy: *** Neuromuscular re-ed: *** Therapeutic Activity: *** Modalities: *** Self Care: ***  [facility] Adult PT Treatment:                                                DATE: 12/21/22 Therapeutic Exercise: Bridging with ball x 10  Single leg bridge x 10 each  Hamstring kicks, gentle release  Lower abdominals hold with ball under pelvis Reverse toe taps with ball  Lumbar twist, knee to chest with rotation  Sidelying hip abduction 2 lbs  x 1 x 15 lbs , sidekicks x 10  Hip adduction x 15   Footwork 2 red 1 Green : heels, toes in hip ER double leg  Footwork single leg 2 red 1 blue added hip circle to top leg x 10  Footwork single leg hamstring pull 1 red 1 blue  Rt LE hamstring, ITB stretch with strap, opp leg in well   [facility] Adult PT Treatment:                                                DATE: 12/15/22 Therapeutic Exercise: Pilates Reformer used for LE/core strength, postural strength, lumbopelvic disassociation and core control.  Exercises included: Footwork 2 red 1 blue 1 yellow   Double leg Parallel heels, toes narrow and wide Pilates V heels and toes narrow and wide  2 red 1 blue  Single leg  Heel in parallel and turnout 2 red 1 blue  Bridging all springs articulating  x 10 then x 5 with 3 springs     Feet in Straps 1 red 1 yellow spring  Arcs, circles and squats                                                                                                                         [facility] Adult PT Treatment:                                                 DATE: 12/11/22 Therapeutic Exercise: R piriformis stretch with towel 30s x2 R hip flexor stertch 30s x2 Prone on elbows 2 min Prone press 10x f/b 10 with PT OP Manual Therapy: Skilled palpation to identify taught and irritable bands in R piriformis as well as R proximal biceps femoris Trigger Point Dry Needling Treatment: Pre-treatment instruction: Patient instructed on dry needling rationale, procedures, and possible side effects including pain during treatment (achy,cramping feeling), bruising, drop of blood, lightheadedness, nausea, sweating. Patient Consent Given: Yes Education handout provided: No Muscles treated: R piriformis (caudal  fibers)  Needle size and number: .30x64mm x 1 Electrical stimulation performed: No Parameters: N/A Treatment response/outcome: Twitch response elicited and Palpable decrease in muscle tension Post-treatment instructions: Patient instructed to expect possible mild to moderate muscle soreness later today and/or tomorrow. Patient instructed in methods to reduce muscle soreness and to continue prescribed HEP. If patient was dry needled over the lung field, patient was instructed on signs and symptoms of pneumothorax and, however unlikely, to see immediate medical attention should they occur. Patient was also educated on signs and symptoms of infection and to seek medical attention should they occur. Patient verbalized understanding of these instructions and education.   [facility] Adult PT Treatment:                                                DATE: 12/09/22 Therapeutic Exercise: Recumbent bike L 3, 5 min  Supine static hamstring and ITB  Dynamic hamstring stretch with towel Rt LE only, continuous and gentle  Bridge (glute)  BOSU hamstring bridge x 10 double  BOSU single  bridge x 10  HS bridge hold with march   Prone hamstring curl 15 lbs bilateral legs x 10 Prone hip extension x 15  Childs pose  Quadruped clam and hydrant x 15  Standing  pigeon pose   [facility] Adult PT Treatment:                                                DATE: 11/30/22 Therapeutic Exercise: Hamstring, ITB stretch with strap x 3 , 30 sec  Side-lying banded hip abduction and clam, no band  Piriformis stretch  x 2 , 30 sec  Prone hamstring curl x 10 x 2 sets Prone hip extension 2 x 10  Childs pose  Cat and camel Hip circles in quadruped   Manual Therapy: IASTM, manual therapy to Rt piriformis and ITB and biceps femoris    TrP to Rt biceps femoris, piriformis  Modalities: Cold pack 10 min supine glute and prox hamstring Self Care: Rec. ice 2-3 x per day   Modify activity   11/27/22 PT evaluation completed Streamlined home exercise program Discussed core stabilization and current fitness regime I asked her to pare down her routine a bit, get more focused and reduce stress, mentioned Pilates  She performed her new home exercise program fully see below   PATIENT EDUCATION:  Education details: See above Person educated: Patient Education method: Explanation, Demonstration, Verbal cues, and Handouts Education comprehension: verbalized understanding, returned demonstration, and needs further education  HOME EXERCISE PROGRAM:  [email or web address] Access Code: [email or web address] URL: [email or web address] Date: 11/30/2022 Prepared by: Karie Mainland  Exercises - Supine Piriformis Stretch  - 1 x daily - 7 x weekly - 3 sets - 10 reps - Supine Piriformis Stretch  - 2 x daily - 7 x weekly - 1 sets - 3 reps - 30 second hold - Sidelying Hip Abduction  - 1 x daily - 7 x weekly - 3 sets - 10 reps - Supine Hamstring Stretch with Strap  - 1 x daily - 7 x weekly - 1 sets - 3-5 reps - 30 hold - Supine ITB Stretch with Strap  - 1 x daily - 7 x weekly - 1  sets - 3-5 reps - 30 hold - Clamshell with Resistance  - 1 x daily - 7 x weekly - 2 sets - 10 reps - 5 hold   ASSESSMENT:  CLINICAL IMPRESSION: Pt is doing much better, noting increased ability to walk and be  comfortable sleeping, with ADLs. She feels like the dry needling and reformer were key in isolating the problem area.  She will cont POC and benefit from 1-2 more dry needling sessions in the coming weeks.   OBJECTIVE IMPAIRMENTS: decreased mobility, decreased strength, increased fascial restrictions, impaired flexibility, and pain.   ACTIVITY LIMITATIONS: squatting, transfers, and locomotion level  PARTICIPATION LIMITATIONS: community activity  PERSONAL FACTORS: Behavior pattern, Past/current experiences, and 1-2 comorbidities: Mild orthopedic injuries foot and shoulder  are also affecting patient's functional outcome.   REHAB POTENTIAL: Excellent  CLINICAL DECISION MAKING: Stable/uncomplicated  EVALUATION COMPLEXITY: Low   GOALS: Goals reviewed with patient? Yes  SHORT TERM GOALS= LONG TERM GOALS  Target date: 01/08/2023     Patient will be independent with home exercise program Baseline: Goal status:ongoing, up to date   2.  Patient will demonstrate improved perceived mobility and functional strength with step ups and step downs Baseline:  Goal status: ongoing   3.  Patient will be able to cross her right leg over the left with ease to improve lower body dressing Baseline:  Goal status: ongoing   4.  Patient will increase hip abduction strength to 4+/5 bilaterally Baseline:  Goal status: ongoing   5.  Patient will demonstrate proper squat mechanics without cues and without increased pain Baseline:  Goal status:ongoing   6.  Foto score will improve to 86% Baseline:  Goal status: INITIAL   PLAN:  PT FREQUENCY: 2x/week  PT DURATION: 6 weeks  PLANNED INTERVENTIONS: 97164- PT Re-evaluation, 97110-Therapeutic exercises, 97530- Therapeutic activity, 97112- Neuromuscular re-education, 97535- Self Care, 13086- Manual therapy, 97014- Electrical stimulation (unattended), 97033- Ionotophoresis 4mg /ml Dexamethasone, Patient/Family education, Dry Needling, Joint  mobilization, Spinal mobilization, Cryotherapy, and Moist heat  PLAN FOR NEXT SESSION: hip abd/ext strength, manual therapy, trigger point dry needling to right piriformis right biceps femoris, Pilates reformer and core.   Javeion Cannedy, PT 12/28/2022, 3:57 PM   Karie Mainland, PT 12/28/22 3:57 PM Phone: [phone] Fax: [phone]

## 2022-12-29 ENCOUNTER — Ambulatory Visit: Payer: Managed Care, Other (non HMO) | Admitting: Physical Therapy

## 2022-12-29 ENCOUNTER — Encounter: Payer: Self-pay | Admitting: Physical Therapy

## 2022-12-29 DIAGNOSIS — M6281 Muscle weakness (generalized): Secondary | ICD-10-CM

## 2022-12-29 DIAGNOSIS — M2560 Stiffness of unspecified joint, not elsewhere classified: Secondary | ICD-10-CM

## 2022-12-29 DIAGNOSIS — R252 Cramp and spasm: Secondary | ICD-10-CM

## 2022-12-29 DIAGNOSIS — M25551 Pain in right hip: Secondary | ICD-10-CM | POA: Diagnosis not present

## 2022-12-29 NOTE — Therapy (Unsigned)
OUTPATIENT PHYSICAL THERAPY NOTE   Patient Name: Kimberly Stevens MRN: [id number] DOB:29-Mar-1965, 57 y.o., female Today's Date: 12/29/2022  END OF SESSION:          Past Medical History:  Diagnosis Date   Allergy    seasonal allergies   Arthritis    left great toe joint   Broken arm    right elbow & wrist   Colon polyps    benign   Depression    hx of post partum   Dermoid cyst of ovary    Eye disorder    possible glaucoma-being followed by Dr Deatra James   Fibrocystic breast    IBS (irritable bowel syndrome)    Plantar fasciitis    left foot 2/15-11/15   Polyp of duodenum 2014   tiny   Tendonitis    right knee 2/15-11/15   Torn rotator cuff    right   Past Surgical History:  Procedure Laterality Date   BROW LIFT Bilateral 02/22/2020   Procedure: BLEPHAROPLASTY UPPER EYELID; W/EXCESS SKIN BILATERAL;  Surgeon: Imagene Riches, MD;  Location: [facility];  Service: Ophthalmology;  Laterality: Bilateral;   COLONOSCOPY  2014   JP-MAC-movi(exc)-poylpoid   POLYPECTOMY  2014   polypoid   WISDOM TOOTH EXTRACTION     Patient Active Problem List   Diagnosis Date Noted   Deviated nasal septum 11/18/2022   Hypertrophy of nasal turbinates 11/18/2022   Chronic maxillary sinusitis 11/18/2022   Polyp of nasal sinus 11/18/2022   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   Piriformis syndrome, right 11/03/2022   Cough variant asthma 10/19/2022   Chronic cough 05/04/2022   Hallux rigidus of both feet 07/15/2021   Costochondritis 03/20/2021   Seasonal allergies 09/09/2020   Postcoital UTI 09/09/2020   Strain of hamstring, subsequent encounter 08/25/2016   Plantar fasciitis of right foot 05/28/2016   Acquired hallux limitus of left foot 05/28/2016   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  Rationale for Evaluation and Treatment: Rehabilitation  ONSET DATE: 12 weeks   SUBJECTIVE:   SUBJECTIVE STATEMENT: Patient was sick, sinus infection.  She is on prednisone so she has no pain .  Was in the bed for 5 days.   EVAL:  Pt has had R sided hip pain about 12 weeks ago.  She has been doing stretches that have helped a bit.  She is concerned that when her hip is extended it is hard to put wgt on it.  She has pain when lowering her leg from a step or ladder.  She feels like it will give way. She has pain when moving her leg into a position to stretch, but once she is there it spine.  She reports she has always had tight hips.   Has been rolling and working it out.  She is very active and reports doing pickleball, golf, walking and Peloton workouts.  She has stopped biking a little bit.    PERTINENT HISTORY: Old hamstring tear  Gr toe OA bilateral   11/18.24: Dr Pearletha Forge visit   1)Piriformis syndrome: Improving with PT and home exercises.  Can get back to walking on flat surfaces gradually -- for the first week every other day and then daily.  Can continue biking and swimming depending  on pain level.  Abstain from pickleball until follow-up.  Continue work with PT.   2.  Hamstring tendinopathy: As evidenced on exam and on ultrasound.  Likely contributing to pain.  Can continue exercises above.  Continue to work with PT. Add nitroglycerin patches to area of pain daily.    PAIN:  Are you having pain? Yes: NPRS scale: 1/10 Pain location: Rt post hip, prox 1/3 hamstring .  Pain and tension along the right lateral thigh, ITB and right proximal hip as well  Pain description: tight Aggravating factors: Pain when changing positions of the leg like crossing it rolling over moving into a position. Relieving factors: tennis ball, stretching, has not tried heat or ice   Pain when active is moderate 5/10-6/10.   PRECAUTIONS: None  RED  FLAGS: None   WEIGHT BEARING RESTRICTIONS: No  FALLS:  Has patient fallen in last 6 months? No  LIVING ENVIRONMENT: Lives with: lives with their family Lives in: House/apartment Stairs:  no issues  Has following equipment at home: None  OCCUPATION: not working   PLOF: Independent  PATIENT GOALS: Pt  would like to have less or no pain without limitation of pain . Get rid of braces.  (Helix)  NEXT MD VISIT: as needed   OBJECTIVE:  Note: Objective measures were completed at Evaluation unless otherwise noted.  DIAGNOSTIC FINDINGS: None performed recently  PATIENT SURVEYS:  FOTO 64 goal is 86 %  COGNITION: Overall cognitive status: Within functional limits for tasks assessed     SENSATION: WFL  EDEMA:    MUSCLE LENGTH: Hamstrings: Right 25 deg; Left 25 deg Min tight hip flexors in prone   POSTURE: decreased lumbar lordosis  PALPATION: Pain with palpation to right piriformis and proximal one third of the hamstring laterally.  No pain or tenderness in gluteus medius. TTP left posterior hip as well but not symptomatic.  Stiffness in lower lumbar spine  LOWER EXTREMITY ROM: WFL   Passive ROM Right eval Left eval  Hip flexion    Hip extension    Hip abduction    Hip adduction    Hip internal rotation    Hip external rotation    Knee flexion    Knee extension    Ankle dorsiflexion    Ankle plantarflexion    Ankle inversion    Ankle eversion     (Blank rows = not tested)  LOWER EXTREMITY MMT:  MMT Right eval Left eval  Hip flexion 4+ 4+  Hip extension     Hip abduction 3+ 3+  Hip adduction    Hip internal rotation No pain  No pain   Hip external rotation Pain  No pain   Knee flexion 5 5  Knee extension 5 5  Ankle dorsiflexion 5 5  Ankle plantarflexion    Ankle inversion    Ankle eversion     (Blank rows = not tested)  LOWER EXTREMITY SPECIAL TESTS:  Hip special tests: NT on eval   FUNCTIONAL TESTS:  Squat-able to squat without cues and no  increased pain.  When asked to increase hip flexion with more of a hinge she did have some discomfort in the hip as well as the back Less functional strength shown with 8 inch step ups in the right lower extremity compared to the left GAIT: No issues    TODAY'S TREATMENT:         [facility] Adult PT Treatment:  DATE: 12/29/22 Therapeutic Exercise: Pilates Tower for LE/Core strength, postural strength, lumbopelvic disassociation and core control.  Exercises included: Arm [place] arm arcs x 10 added bridge for 2nd set x 10  Supine Leg [place] single leg arcs x 10, circles Double leg arcs and scissors and squats.  Sidelying Leg [place] adduction, sidekicks  Tall kneeling shoulder ext x 10 with roll down bar Thigh stretch   x 10  Figure 4; 30 sec  Knee over knee to rotation increased time to feel stretch   [facility] Adult PT Treatment:                                                DATE: 12/21/22 Therapeutic Exercise: Bridging with ball x 10  Single leg bridge x 10 each  Hamstring kicks, gentle release  Lower abdominals hold with ball under pelvis Reverse toe taps with ball  Lumbar twist, knee to chest with rotation  Sidelying hip abduction 2 lbs  x 1 x 15 lbs , sidekicks x 10  Hip adduction x 15   Footwork 2 red 1 Green : heels, toes in hip ER double leg  Footwork single leg 2 red 1 blue added hip circle to top leg x 10  Footwork single leg hamstring pull 1 red 1 blue  Rt LE hamstring, ITB stretch with strap, opp leg in well   [facility] Adult PT Treatment:                                                DATE: 12/15/22 Therapeutic Exercise: Pilates Reformer used for LE/core strength, postural strength, lumbopelvic disassociation and core control.  Exercises included: Footwork 2 red 1 blue 1 yellow   Double leg Parallel heels, toes narrow and wide Pilates V heels and toes narrow and wide  2 red 1 blue  Single leg  Heel in parallel and turnout 2 red  1 blue  Bridging all springs articulating  x 10 then x 5 with 3 springs     Feet in Straps 1 red 1 yellow spring  Arcs, circles and squats                                                                                                                         [facility] Adult PT Treatment:                                                DATE: 12/11/22 Therapeutic Exercise: R piriformis stretch with towel 30s x2 R hip flexor stertch 30s x2 Prone on elbows 2 min Prone press  10x f/b 10 with PT OP Manual Therapy: Skilled palpation to identify taught and irritable bands in R piriformis as well as R proximal biceps femoris Trigger Point Dry Needling Treatment: Pre-treatment instruction: Patient instructed on dry needling rationale, procedures, and possible side effects including pain during treatment (achy,cramping feeling), bruising, drop of blood, lightheadedness, nausea, sweating. Patient Consent Given: Yes Education handout provided: No Muscles treated: R piriformis (caudal fibers)  Needle size and number: .30x18mm x 1 Electrical stimulation performed: No Parameters: N/A Treatment response/outcome: Twitch response elicited and Palpable decrease in muscle tension Post-treatment instructions: Patient instructed to expect possible mild to moderate muscle soreness later today and/or tomorrow. Patient instructed in methods to reduce muscle soreness and to continue prescribed HEP. If patient was dry needled over the lung field, patient was instructed on signs and symptoms of pneumothorax and, however unlikely, to see immediate medical attention should they occur. Patient was also educated on signs and symptoms of infection and to seek medical attention should they occur. Patient verbalized understanding of these instructions and education.   [facility] Adult PT Treatment:                                                DATE: 12/09/22 Therapeutic Exercise: Recumbent bike L 3, 5 min  Supine static hamstring  and ITB  Dynamic hamstring stretch with towel Rt LE only, continuous and gentle  Bridge (glute)  BOSU hamstring bridge x 10 double  BOSU single  bridge x 10  HS bridge hold with march   Prone hamstring curl 15 lbs bilateral legs x 10 Prone hip extension x 15  Childs pose  Quadruped clam and hydrant x 15  Standing pigeon pose   [facility] Adult PT Treatment:                                                DATE: 11/30/22 Therapeutic Exercise: Hamstring, ITB stretch with strap x 3 , 30 sec  Side-lying banded hip abduction and clam, no band  Piriformis stretch  x 2 , 30 sec  Prone hamstring curl x 10 x 2 sets Prone hip extension 2 x 10  Childs pose  Cat and camel Hip circles in quadruped   Manual Therapy: IASTM, manual therapy to Rt piriformis and ITB and biceps femoris    TrP to Rt biceps femoris, piriformis  Modalities: Cold pack 10 min supine glute and prox hamstring Self Care: Rec. ice 2-3 x per day   Modify activity   11/27/22 PT evaluation completed Streamlined home exercise program Discussed core stabilization and current fitness regime I asked her to pare down her routine a bit, get more focused and reduce stress, mentioned Pilates  She performed her new home exercise program fully see below   PATIENT EDUCATION:  Education details: See above Person educated: Patient Education method: Explanation, Demonstration, Verbal cues, and Handouts Education comprehension: verbalized understanding, returned demonstration, and needs further education  HOME EXERCISE PROGRAM:  [email or web address] Access Code: [email or web address] URL: [email or web address] Date: 11/30/2022 Prepared by: Karie Mainland  Exercises - Supine Piriformis Stretch  - 1 x daily - 7 x weekly - 3 sets - 10 reps - Supine Piriformis Stretch  - 2 x daily - 7 x weekly -  1 sets - 3 reps - 30 second hold - Sidelying Hip Abduction  - 1 x daily - 7 x weekly - 3 sets - 10 reps - Supine Hamstring Stretch with Strap  - 1 x  daily - 7 x weekly - 1 sets - 3-5 reps - 30 hold - Supine ITB Stretch with Strap  - 1 x daily - 7 x weekly - 1 sets - 3-5 reps - 30 hold - Clamshell with Resistance  - 1 x daily - 7 x weekly - 2 sets - 10 reps - 5 hold   ASSESSMENT:  CLINICAL IMPRESSION: Patient without pain today, medicated for a sinus infection. Pilates Tower was utilized to isolate hamstrings and challenge core stability in supine and sidelying.  Pt had difficulty when knees were not hyperextended, Rt hamstring rolls over the ischial tuberosity in some angles of hip extension, rotation. No increased pain just fatigue in hamstring and adductors.   OBJECTIVE IMPAIRMENTS: decreased mobility, decreased strength, increased fascial restrictions, impaired flexibility, and pain.   ACTIVITY LIMITATIONS: squatting, transfers, and locomotion level  PARTICIPATION LIMITATIONS: community activity  PERSONAL FACTORS: Behavior pattern, Past/current experiences, and 1-2 comorbidities: Mild orthopedic injuries foot and shoulder  are also affecting patient's functional outcome.   REHAB POTENTIAL: Excellent  CLINICAL DECISION MAKING: Stable/uncomplicated  EVALUATION COMPLEXITY: Low   GOALS: Goals reviewed with patient? Yes  SHORT TERM GOALS= LONG TERM GOALS  Target date: 01/08/2023     Patient will be independent with home exercise program Baseline: Goal status:ongoing, up to date   2.  Patient will demonstrate improved perceived mobility and functional strength with step ups and step downs Baseline:  Goal status: ongoing   3.  Patient will be able to cross her right leg over the left with ease to improve lower body dressing Baseline:  Goal status: ongoing   4.  Patient will increase hip abduction strength to 4+/5 bilaterally Baseline:  Goal status: ongoing   5.  Patient will demonstrate proper squat mechanics without cues and without increased pain Baseline:  Goal status:ongoing   6.  Foto score will improve to  86% Baseline:  Goal status: INITIAL   PLAN:  PT FREQUENCY: 2x/week  PT DURATION: 6 weeks  PLANNED INTERVENTIONS: 97164- PT Re-evaluation, 97110-Therapeutic exercises, 97530- Therapeutic activity, 97112- Neuromuscular re-education, 97535- Self Care, 08657- Manual therapy, 97014- Electrical stimulation (unattended), 97033- Ionotophoresis 4mg /ml Dexamethasone, Patient/Family education, Dry Needling, Joint mobilization, Spinal mobilization, Cryotherapy, and Moist heat  PLAN FOR NEXT SESSION: hip abd/ext strength, manual therapy, trigger point dry needling to right piriformis right biceps femoris, Pilates reformer and core.   Hildred Laser, PT 12/29/2022, 5:47 PM   Karie Mainland, PT 12/29/22 5:47 PM Phone: [phone] Fax: [phone]

## 2022-12-30 ENCOUNTER — Ambulatory Visit: Payer: Managed Care, Other (non HMO)

## 2022-12-30 NOTE — Therapy (Signed)
OUTPATIENT PHYSICAL THERAPY NOTE   Patient Name: Kimberly Stevens MRN: [id number] DOB:[birth year], 57 y.o., female Today's Date: 01/01/2023  END OF SESSION:  PT End of Session - 01/01/23 0836     Visit Number 8    Number of Visits 12    Date for PT Re-Evaluation 01/08/23    Authorization Type [employer]    PT Start Time [phone]    PT Stop Time 0910    PT Time Calculation (min) 35 min    Activity Tolerance Patient tolerated treatment well    Behavior During Therapy [facility] for tasks assessed/performed                    Past Medical History:  Diagnosis Date   Allergy    seasonal allergies   Arthritis    left great toe joint   Broken arm    right elbow & wrist   Colon polyps    benign   Depression    hx of post partum   Dermoid cyst of ovary    Eye disorder    possible glaucoma-being followed by Dr Deatra James   Fibrocystic breast    IBS (irritable bowel syndrome)    Plantar fasciitis    left foot 2/15-11/15   Polyp of duodenum 2014   tiny   Tendonitis    right knee 2/15-11/15   Torn rotator cuff    right   Past Surgical History:  Procedure Laterality Date   BROW LIFT Bilateral 02/22/2020   Procedure: BLEPHAROPLASTY UPPER EYELID; W/EXCESS SKIN BILATERAL;  Surgeon: Imagene Riches, MD;  Location: [facility];  Service: Ophthalmology;  Laterality: Bilateral;   COLONOSCOPY  2014   JP-MAC-movi(exc)-poylpoid   POLYPECTOMY  2014   polypoid   WISDOM TOOTH EXTRACTION     Patient Active Problem List   Diagnosis Date Noted   Deviated nasal septum 11/18/2022   Hypertrophy of nasal turbinates 11/18/2022   Chronic maxillary sinusitis 11/18/2022   Polyp of nasal sinus 11/18/2022   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   Piriformis syndrome, right 11/03/2022   Cough variant asthma 10/19/2022   Chronic cough 05/04/2022   Hallux rigidus of both feet 07/15/2021   Costochondritis 03/20/2021   Seasonal allergies 09/09/2020   Postcoital UTI 09/09/2020   Strain of hamstring,  subsequent encounter 08/25/2016   Plantar fasciitis of right foot 05/28/2016   Acquired hallux limitus of left foot 05/28/2016   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  Also on prednisone over the past 5 days which can mitigate symptoms.  Minimal pain and discomfort reported today, mainly low back soreness.     EVAL:  Pt has had R sided hip pain about 12 weeks ago.  She has been doing stretches that have helped a bit.  She is concerned that when her hip is extended it is hard to put wgt on it.  She has pain when lowering her leg from a step or ladder.  She feels like it will give way. She has pain when moving her leg into a position to  stretch, but once she is there it spine.  She reports she has always had tight hips.   Has been rolling and working it out.  She is very active and reports doing pickleball, golf, walking and Peloton workouts.  She has stopped biking a little bit.    PERTINENT HISTORY: Old hamstring tear  Gr toe OA bilateral   11/18.24: Dr Pearletha Forge visit   1)Piriformis syndrome: Improving with PT and home exercises.  Can get back to walking on flat surfaces gradually -- for the first week every other day and then daily.  Can continue biking and swimming depending on pain level.  Abstain from pickleball until follow-up.  Continue work with PT.   2.  Hamstring tendinopathy: As evidenced on exam and on ultrasound.  Likely contributing to pain.  Can continue exercises above.  Continue to work with PT. Add nitroglycerin patches to area  of pain daily.    PAIN:  Are you having pain? Yes: NPRS scale: 1/10 Pain location: Rt post hip, prox 1/3 hamstring .  Pain and tension along the right lateral thigh, ITB and right proximal hip as well  Pain description: tight Aggravating factors: Pain when changing positions of the leg like crossing it rolling over moving into a position. Relieving factors: tennis ball, stretching, has not tried heat or ice   Pain when active is moderate 5/10-6/10.   PRECAUTIONS: None  RED FLAGS: None   WEIGHT BEARING RESTRICTIONS: No  FALLS:  Has patient fallen in last 6 months? No  LIVING ENVIRONMENT: Lives with: lives with their family Lives in: House/apartment Stairs:  no issues  Has following equipment at home: None  OCCUPATION: not working   PLOF: Independent  PATIENT GOALS: Pt  would like to have less or no pain without limitation of pain . Get rid of braces.  (Helix)  NEXT MD VISIT: as needed   OBJECTIVE:  Note: Objective measures were completed at Evaluation unless otherwise noted.  DIAGNOSTIC FINDINGS: None performed recently  PATIENT SURVEYS:  FOTO 64 goal is 86 %  COGNITION: Overall cognitive status: Within functional limits for tasks assessed     SENSATION: WFL  EDEMA:    MUSCLE LENGTH: Hamstrings: Right 25 deg; Left 25 deg Min tight hip flexors in prone   POSTURE: decreased lumbar lordosis  PALPATION: Pain with palpation to right piriformis and proximal one third of the hamstring laterally.  No pain or tenderness in gluteus medius. TTP left posterior hip as well but not symptomatic.  Stiffness in lower lumbar spine  LOWER EXTREMITY ROM: WFL   Passive ROM Right eval Left eval  Hip flexion    Hip extension    Hip abduction    Hip adduction    Hip internal rotation    Hip external rotation    Knee flexion    Knee extension    Ankle dorsiflexion    Ankle plantarflexion    Ankle inversion    Ankle eversion     (Blank rows = not tested)  LOWER  EXTREMITY MMT:  MMT Right eval Left eval  Hip flexion 4+ 4+  Hip extension     Hip abduction 3+ 3+  Hip adduction    Hip internal rotation No pain  No pain   Hip external rotation Pain  No pain   Knee flexion 5 5  Knee extension 5 5  Ankle dorsiflexion 5 5  Ankle plantarflexion    Ankle inversion    Ankle eversion     (Blank  rows = not tested)  LOWER EXTREMITY SPECIAL TESTS:  Hip special tests: NT on eval   FUNCTIONAL TESTS:  Squat-able to squat without cues and no increased pain.  When asked to increase hip flexion with more of a hinge she did have some discomfort in the hip as well as the back Less functional strength shown with 8 inch step ups in the right lower extremity compared to the left GAIT: No issues    TODAY'S TREATMENT:        [facility] Adult PT Treatment:                                                DATE: 01/01/23  Manual Therapy: Skilled palpation to identify and re-assess taught and irritable bands in B multifidi, R iliocostalis Trigger Point Dry Needling Treatment: Pre-treatment instruction: Patient instructed on dry needling rationale, procedures, and possible side effects including pain during treatment (achy,cramping feeling), bruising, drop of blood, lightheadedness, nausea, sweating. Patient Consent Given: Yes Education handout provided: Previously provided Muscles treated: B multifidi, R iliocostalis  Needle size and number: .25x39mm x 3 Electrical stimulation performed: No Parameters: N/A Treatment response/outcome: Twitch response elicited, Palpable decrease in muscle tension, and less symptomatic Post-treatment instructions: Patient instructed to expect possible mild to moderate muscle soreness later today and/or tomorrow. Patient instructed in methods to reduce muscle soreness and to continue prescribed HEP. If patient was dry needled over the lung field, patient was instructed on signs and symptoms of pneumothorax and, however unlikely, to see  immediate medical attention should they occur. Patient was also educated on signs and symptoms of infection and to seek medical attention should they occur. Patient verbalized understanding of these instructions and education.     [facility] Adult PT Treatment:                                                DATE: 12/29/22 Therapeutic Exercise: Pilates Tower for LE/Core strength, postural strength, lumbopelvic disassociation and core control.  Exercises included: Arm [place] arm arcs x 10 added bridge for 2nd set x 10  Supine Leg [place] single leg arcs x 10, circles Double leg arcs and scissors and squats.  Sidelying Leg [place] adduction, sidekicks  Tall kneeling shoulder ext x 10 with roll down bar Thigh stretch   x 10  Figure 4; 30 sec  Knee over knee to rotation increased time to feel stretch   [facility] Adult PT Treatment:                                                DATE: 12/21/22 Therapeutic Exercise: Bridging with ball x 10  Single leg bridge x 10 each  Hamstring kicks, gentle release  Lower abdominals hold with ball under pelvis Reverse toe taps with ball  Lumbar twist, knee to chest with rotation  Sidelying hip abduction 2 lbs  x 1 x 15 lbs , sidekicks x 10  Hip adduction x 15   Footwork 2 red 1 Green : heels, toes in hip ER double leg  Footwork single leg 2 red 1 blue added  hip circle to top leg x 10  Footwork single leg hamstring pull 1 red 1 blue  Rt LE hamstring, ITB stretch with strap, opp leg in well   [facility] Adult PT Treatment:                                                DATE: 12/15/22 Therapeutic Exercise: Pilates Reformer used for LE/core strength, postural strength, lumbopelvic disassociation and core control.  Exercises included: Footwork 2 red 1 blue 1 yellow   Double leg Parallel heels, toes narrow and wide Pilates V heels and toes narrow and wide  2 red 1 blue  Single leg  Heel in parallel and turnout 2 red 1 blue  Bridging all springs articulating  x 10 then x  5 with 3 springs     Feet in Straps 1 red 1 yellow spring  Arcs, circles and squats                                                                                                                         [facility] Adult PT Treatment:                                                DATE: 12/11/22 Therapeutic Exercise: R piriformis stretch with towel 30s x2 R hip flexor stertch 30s x2 Prone on elbows 2 min Prone press 10x f/b 10 with PT OP Manual Therapy: Skilled palpation to identify taught and irritable bands in R piriformis as well as R proximal biceps femoris Trigger Point Dry Needling Treatment: Pre-treatment instruction: Patient instructed on dry needling rationale, procedures, and possible side effects including pain during treatment (achy,cramping feeling), bruising, drop of blood, lightheadedness, nausea, sweating. Patient Consent Given: Yes Education handout provided: No Muscles treated: R piriformis (caudal fibers)  Needle size and number: .30x77mm x 1 Electrical stimulation performed: No Parameters: N/A Treatment response/outcome: Twitch response elicited and Palpable decrease in muscle tension Post-treatment instructions: Patient instructed to expect possible mild to moderate muscle soreness later today and/or tomorrow. Patient instructed in methods to reduce muscle soreness and to continue prescribed HEP. If patient was dry needled over the lung field, patient was instructed on signs and symptoms of pneumothorax and, however unlikely, to see immediate medical attention should they occur. Patient was also educated on signs and symptoms of infection and to seek medical attention should they occur. Patient verbalized understanding of these instructions and education.   [facility] Adult PT Treatment:  DATE: 12/09/22 Therapeutic Exercise: Recumbent bike L 3, 5 min  Supine static hamstring and ITB  Dynamic hamstring stretch with towel Rt LE only,  continuous and gentle  Bridge (glute)  BOSU hamstring bridge x 10 double  BOSU single  bridge x 10  HS bridge hold with march   Prone hamstring curl 15 lbs bilateral legs x 10 Prone hip extension x 15  Childs pose  Quadruped clam and hydrant x 15  Standing pigeon pose   [facility] Adult PT Treatment:                                                DATE: 11/30/22 Therapeutic Exercise: Hamstring, ITB stretch with strap x 3 , 30 sec  Side-lying banded hip abduction and clam, no band  Piriformis stretch  x 2 , 30 sec  Prone hamstring curl x 10 x 2 sets Prone hip extension 2 x 10  Childs pose  Cat and camel Hip circles in quadruped   Manual Therapy: IASTM, manual therapy to Rt piriformis and ITB and biceps femoris    TrP to Rt biceps femoris, piriformis  Modalities: Cold pack 10 min supine glute and prox hamstring Self Care: Rec. ice 2-3 x per day   Modify activity   11/27/22 PT evaluation completed Streamlined home exercise program Discussed core stabilization and current fitness regime I asked her to pare down her routine a bit, get more focused and reduce stress, mentioned Pilates  She performed her new home exercise program fully see below   PATIENT EDUCATION:  Education details: See above Person educated: Patient Education method: Explanation, Demonstration, Verbal cues, and Handouts Education comprehension: verbalized understanding, returned demonstration, and needs further education  HOME EXERCISE PROGRAM:  [email or web address] Access Code: [email or web address] URL: [email or web address] Date: 11/30/2022 Prepared by: Karie Mainland  Exercises - Supine Piriformis Stretch  - 1 x daily - 7 x weekly - 3 sets - 10 reps - Supine Piriformis Stretch  - 2 x daily - 7 x weekly - 1 sets - 3 reps - 30 second hold - Sidelying Hip Abduction  - 1 x daily - 7 x weekly - 3 sets - 10 reps - Supine Hamstring Stretch with Strap  - 1 x daily - 7 x weekly - 1 sets - 3-5 reps - 30 hold - Supine ITB  Stretch with Strap  - 1 x daily - 7 x weekly - 1 sets - 3-5 reps - 30 hold - Clamshell with Resistance  - 1 x daily - 7 x weekly - 2 sets - 10 reps - 5 hold   ASSESSMENT:  CLINICAL IMPRESSION:  Minimal tenderness to R piriformis and hamstring groups.  Expanded palpation to lumbosacral region to find irritable bands in B piriformis MM. As well as R iliocostalis which did refer symptoms to gluteal regions.  Good response to TPDN as tenderness subsided and patient noted less tension in affected regions.    Patient without pain today, medicated for a sinus infection. Pilates Tower was utilized to isolate hamstrings and challenge core stability in supine and sidelying.  Pt had difficulty when knees were not hyperextended, Rt hamstring rolls over the ischial tuberosity in some angles of hip extension, rotation. No increased pain just fatigue in hamstring and adductors.   OBJECTIVE IMPAIRMENTS: decreased mobility, decreased strength, increased fascial restrictions, impaired flexibility, and pain.  ACTIVITY LIMITATIONS: squatting, transfers, and locomotion level  PARTICIPATION LIMITATIONS: community activity  PERSONAL FACTORS: Behavior pattern, Past/current experiences, and 1-2 comorbidities: Mild orthopedic injuries foot and shoulder  are also affecting patient's functional outcome.   REHAB POTENTIAL: Excellent  CLINICAL DECISION MAKING: Stable/uncomplicated  EVALUATION COMPLEXITY: Low   GOALS: Goals reviewed with patient? Yes  SHORT TERM GOALS= LONG TERM GOALS  Target date: 01/08/2023     Patient will be independent with home exercise program Baseline: Goal status:ongoing, up to date   2.  Patient will demonstrate improved perceived mobility and functional strength with step ups and step downs Baseline:  Goal status: ongoing   3.  Patient will be able to cross her right leg over the left with ease to improve lower body dressing Baseline:  Goal status: ongoing   4.  Patient will  increase hip abduction strength to 4+/5 bilaterally Baseline:  Goal status: ongoing   5.  Patient will demonstrate proper squat mechanics without cues and without increased pain Baseline:  Goal status:ongoing   6.  Foto score will improve to 86% Baseline:  Goal status: INITIAL   PLAN:  PT FREQUENCY: 2x/week  PT DURATION: 6 weeks  PLANNED INTERVENTIONS: 97164- PT Re-evaluation, 97110-Therapeutic exercises, 97530- Therapeutic activity, 97112- Neuromuscular re-education, 97535- Self Care, 24401- Manual therapy, 97014- Electrical stimulation (unattended), 97033- Ionotophoresis 4mg /ml Dexamethasone, Patient/Family education, Dry Needling, Joint mobilization, Spinal mobilization, Cryotherapy, and Moist heat  PLAN FOR NEXT SESSION: hip abd/ext strength, manual therapy, trigger point dry needling to right piriformis right biceps femoris, Pilates reformer and core.   Hildred Laser, PT 01/01/2023, 9:15 AM   Karie Mainland, PT 01/01/23 9:15 AM Phone: [phone] Fax: [phone]

## 2023-01-01 ENCOUNTER — Ambulatory Visit: Payer: Managed Care, Other (non HMO)

## 2023-01-01 DIAGNOSIS — M6281 Muscle weakness (generalized): Secondary | ICD-10-CM

## 2023-01-01 DIAGNOSIS — M25551 Pain in right hip: Secondary | ICD-10-CM | POA: Diagnosis not present

## 2023-01-01 DIAGNOSIS — R252 Cramp and spasm: Secondary | ICD-10-CM

## 2023-01-06 NOTE — Therapy (Unsigned)
OUTPATIENT PHYSICAL THERAPY NOTE   Patient Name: Kimberly Stevens MRN: [id number] DOB:07-19-1965, 57 y.o., female Today's Date: 01/07/2023  END OF SESSION:  PT End of Session - 01/07/23 1234     Visit Number 9    Number of Visits 12    Date for PT Re-Evaluation 01/08/23    Authorization Type [employer]    PT Start Time 1232    PT Stop Time 1312    PT Time Calculation (min) 40 min    Activity Tolerance Patient tolerated treatment well    Behavior During Therapy [facility] for tasks assessed/performed                     Past Medical History:  Diagnosis Date   Allergy    seasonal allergies   Arthritis    left great toe joint   Broken arm    right elbow & wrist   Colon polyps    benign   Depression    hx of post partum   Dermoid cyst of ovary    Eye disorder    possible glaucoma-being followed by Dr Deatra James   Fibrocystic breast    IBS (irritable bowel syndrome)    Plantar fasciitis    left foot 2/15-11/15   Polyp of duodenum 2014   tiny   Tendonitis    right knee 2/15-11/15   Torn rotator cuff    right   Past Surgical History:  Procedure Laterality Date   BROW LIFT Bilateral 02/22/2020   Procedure: BLEPHAROPLASTY UPPER EYELID; W/EXCESS SKIN BILATERAL;  Surgeon: Imagene Riches, MD;  Location: [facility];  Service: Ophthalmology;  Laterality: Bilateral;   COLONOSCOPY  2014   JP-MAC-movi(exc)-poylpoid   POLYPECTOMY  2014   polypoid   WISDOM TOOTH EXTRACTION     Patient Active Problem List   Diagnosis Date Noted   Deviated nasal septum 11/18/2022   Hypertrophy of nasal turbinates 11/18/2022   Chronic maxillary sinusitis 11/18/2022   Polyp of nasal sinus 11/18/2022   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   Piriformis syndrome, right 11/03/2022   Cough variant asthma 10/19/2022   Chronic cough 05/04/2022   Hallux rigidus of both feet 07/15/2021   Costochondritis 03/20/2021   Seasonal allergies 09/09/2020   Postcoital UTI 09/09/2020   Strain of hamstring,  subsequent encounter 08/25/2016   Plantar fasciitis of right foot 05/28/2016   Acquired hallux limitus of left foot 05/28/2016   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  Doing stretches and HEP .  Peloton core 10 min, 10 min of UEs.  Taking the Winter off from pickleball. .  She really likes the DN.   EVAL:  Pt has had R sided hip pain about 12 weeks ago.  She has been doing stretches that have helped a bit.  She is concerned that when her hip is extended it is hard to put wgt on it.  She has pain when lowering her leg from a step or ladder.  She feels like it will give way. She has pain when  moving her leg into a position to stretch, but once she is there it spine.  She reports she has always had tight hips.   Has been rolling and working it out.  She is very active and reports doing pickleball, golf, walking and Peloton workouts.  She has stopped biking a little bit.    PERTINENT HISTORY: Old hamstring tear  Gr toe OA bilateral   11/18.24: Dr Pearletha Forge visit   1)Piriformis syndrome: Improving with PT and home exercises.  Can get back to walking on flat surfaces gradually -- for the first week every other day and then daily.  Can continue biking and swimming depending on pain level.  Abstain from pickleball until follow-up.  Continue work with PT.   2.  Hamstring tendinopathy: As evidenced on exam and on ultrasound.  Likely contributing to pain.  Can continue exercises above.  Continue to work with PT. Add nitroglycerin patches to area of  pain daily.    PAIN:  Are you having pain? Yes: NPRS scale: 1/10 Pain location: Rt post hip, prox 1/3 hamstring .  Pain and tension along the right lateral thigh, ITB and right proximal hip as well  Pain description: tight Aggravating factors: Pain when changing positions of the leg like crossing it rolling over moving into a position. Relieving factors: tennis ball, stretching, has not tried heat or ice   Pain when active is moderate 5/10-6/10.   PRECAUTIONS: None  RED FLAGS: None   WEIGHT BEARING RESTRICTIONS: No  FALLS:  Has patient fallen in last 6 months? No  LIVING ENVIRONMENT: Lives with: lives with their family Lives in: House/apartment Stairs:  no issues  Has following equipment at home: None  OCCUPATION: not working   PLOF: Independent  PATIENT GOALS: Pt  would like to have less or no pain without limitation of pain . Get rid of braces.  (Helix)  NEXT MD VISIT: as needed   OBJECTIVE:  Note: Objective measures were completed at Evaluation unless otherwise noted.  DIAGNOSTIC FINDINGS: None performed recently  PATIENT SURVEYS:  FOTO 64 goal is 86 %  COGNITION: Overall cognitive status: Within functional limits for tasks assessed     SENSATION: WFL  EDEMA:    MUSCLE LENGTH: Hamstrings: Right 25 deg; Left 25 deg Min tight hip flexors in prone   POSTURE: decreased lumbar lordosis  PALPATION: Pain with palpation to right piriformis and proximal one third of the hamstring laterally.  No pain or tenderness in gluteus medius. TTP left posterior hip as well but not symptomatic.  Stiffness in lower lumbar spine  LOWER EXTREMITY ROM: WFL   Passive ROM Right eval Left eval  Hip flexion    Hip extension    Hip abduction    Hip adduction    Hip internal rotation    Hip external rotation    Knee flexion    Knee extension    Ankle dorsiflexion    Ankle plantarflexion    Ankle inversion    Ankle eversion     (Blank rows = not tested)  LOWER  EXTREMITY MMT:  MMT Right eval Left eval 01/07/23   Hip flexion 4+ 4+ 5  Hip extension      Hip abduction 3+ 3+ 4+  Hip adduction     Hip internal rotation No pain  No pain    Hip external rotation Pain  No pain    Knee flexion 5 5   Knee extension 5 5   Ankle dorsiflexion 5 5  Ankle plantarflexion     Ankle inversion     Ankle eversion      (Blank rows = not tested)  LOWER EXTREMITY SPECIAL TESTS:  Hip special tests: NT on eval   FUNCTIONAL TESTS:  Squat-able to squat without cues and no increased pain.  When asked to increase hip flexion with more of a hinge she did have some discomfort in the hip as well as the back Less functional strength shown with 8 inch step ups in the right lower extremity compared to the left GAIT: No issues    TODAY'S TREATMENT:         [facility] Adult PT Treatment:                                                DATE: 01/07/23 Therapeutic Exercise: Supine Foam roller hip stretching: hip flexor Knee to chest and cross over , pinching on  Rt ant. Hip  Dynamic knee extension Rt side Side-lying hip abduction Standing hip abduction Squat 10 pounds x 10 Step downs with with and without extremity support Manual Therapy: IASTM in supine anterolateral thigh and hip Sidelying glute and IT IASTM Hamstring soft tissue work  N Self Care: Progress with physical therapy, plan of care, cashew based dry needling options, Pilates Progressive overload for lateral hip pain and methods to achieve this [facility] Adult PT Treatment:                                                DATE: 01/01/23  Manual Therapy: Skilled palpation to identify and re-assess taught and irritable bands in B multifidi, R iliocostalis Trigger Point Dry Needling Treatment: Pre-treatment instruction: Patient instructed on dry needling rationale, procedures, and possible side effects including pain during treatment (achy,cramping feeling), bruising, drop of blood, lightheadedness, nausea,  sweating. Patient Consent Given: Yes  Education handout provided: Previously provided Muscles treated: B multifidi, R iliocostalis  Needle size and number: .25x97mm x 3 Electrical stimulation performed: No Parameters: N/A Treatment response/outcome: Twitch response elicited, Palpable decrease in muscle tension, and less symptomatic Post-treatment instructions: Patient instructed to expect possible mild to moderate muscle soreness later today and/or tomorrow. Patient instructed in methods to reduce muscle soreness and to continue prescribed HEP. If patient was dry needled over the lung field, patient was instructed on signs and symptoms of pneumothorax and, however unlikely, to see immediate medical attention should they occur. Patient was also educated on signs and symptoms of infection and to seek medical attention should they occur. Patient verbalized understanding of these instructions and education.     [facility] Adult PT Treatment:                                                DATE: 12/29/22 Therapeutic Exercise: Pilates Tower for LE/Core strength, postural strength, lumbopelvic disassociation and core control.  Exercises included: Arm Springs arm arcs x 10 added bridge for 2nd set x 10  Supine Leg [place] single leg arcs x 10, circles Double leg arcs and scissors and squats.  Sidelying Leg [place] adduction, sidekicks  Tall kneeling shoulder ext  x 10 with roll down bar Thigh stretch   x 10  Figure 4; 30 sec  Knee over knee to rotation increased time to feel stretch   [facility] Adult PT Treatment:                                                DATE: 12/21/22 Therapeutic Exercise: Bridging with ball x 10  Single leg bridge x 10 each  Hamstring kicks, gentle release  Lower abdominals hold with ball under pelvis Reverse toe taps with ball  Lumbar twist, knee to chest with rotation  Sidelying hip abduction 2 lbs  x 1 x 15 lbs , sidekicks x 10  Hip adduction x 15   Footwork 2 red 1 Green :  heels, toes in hip ER double leg  Footwork single leg 2 red 1 blue added hip circle to top leg x 10  Footwork single leg hamstring pull 1 red 1 blue  Rt LE hamstring, ITB stretch with strap, opp leg in well   [facility] Adult PT Treatment:                                                DATE: 12/15/22 Therapeutic Exercise: Pilates Reformer used for LE/core strength, postural strength, lumbopelvic disassociation and core control.  Exercises included: Footwork 2 red 1 blue 1 yellow   Double leg Parallel heels, toes narrow and wide Pilates V heels and toes narrow and wide  2 red 1 blue  Single leg  Heel in parallel and turnout 2 red 1 blue  Bridging all springs articulating  x 10 then x 5 with 3 springs     Feet in Straps 1 red 1 yellow spring  Arcs, circles and squats                                                                                                                         [facility] Adult PT Treatment:                                                DATE: 12/11/22 Therapeutic Exercise: R piriformis stretch with towel 30s x2 R hip flexor stertch 30s x2 Prone on elbows 2 min Prone press 10x f/b 10 with PT OP Manual Therapy: Skilled palpation to identify taught and irritable bands in R piriformis as well as R proximal biceps femoris Trigger Point Dry Needling Treatment: Pre-treatment instruction: Patient instructed on dry needling rationale, procedures, and possible side effects including pain during treatment (achy,cramping feeling), bruising, drop of blood, lightheadedness, nausea, sweating. Patient Consent Given:  Yes Education handout provided: No Muscles treated: R piriformis (caudal fibers)  Needle size and number: .30x53mm x 1 Electrical stimulation performed: No Parameters: N/A Treatment response/outcome: Twitch response elicited and Palpable decrease in muscle tension Post-treatment instructions: Patient instructed to expect possible mild to moderate muscle soreness later  today and/or tomorrow. Patient instructed in methods to reduce muscle soreness and to continue prescribed HEP. If patient was dry needled over the lung field, patient was instructed on signs and symptoms of pneumothorax and, however unlikely, to see immediate medical attention should they occur. Patient was also educated on signs and symptoms of infection and to seek medical attention should they occur. Patient verbalized understanding of these instructions and education.   [facility] Adult PT Treatment:                                                DATE: 12/09/22 Therapeutic Exercise: Recumbent bike L 3, 5 min  Supine static hamstring and ITB  Dynamic hamstring stretch with towel Rt LE only, continuous and gentle  Bridge (glute)  BOSU hamstring bridge x 10 double  BOSU single  bridge x 10  HS bridge hold with march   Prone hamstring curl 15 lbs bilateral legs x 10 Prone hip extension x 15  Childs pose  Quadruped clam and hydrant x 15  Standing pigeon pose   [facility] Adult PT Treatment:                                                DATE: 11/30/22 Therapeutic Exercise: Hamstring, ITB stretch with strap x 3 , 30 sec  Side-lying banded hip abduction and clam, no band  Piriformis stretch  x 2 , 30 sec  Prone hamstring curl x 10 x 2 sets Prone hip extension 2 x 10  Childs pose  Cat and camel Hip circles in quadruped   Manual Therapy: IASTM, manual therapy to Rt piriformis and ITB and biceps femoris    TrP to Rt biceps femoris, piriformis  Modalities: Cold pack 10 min supine glute and prox hamstring Self Care: Rec. ice 2-3 x per day   Modify activity   11/27/22 PT evaluation completed Streamlined home exercise program Discussed core stabilization and current fitness regime I asked her to pare down her routine a bit, get more focused and reduce stress, mentioned Pilates  She performed her new home exercise program fully see below   PATIENT EDUCATION:  Education details: See  above Person educated: Patient Education method: Explanation, Demonstration, Verbal cues, and Handouts Education comprehension: verbalized understanding, returned demonstration, and needs further education  HOME EXERCISE PROGRAM:  [email or web address] Access Code: [email or web address] URL: [email or web address] Date: 11/30/2022 Prepared by: Karie Mainland  Exercises - Supine Piriformis Stretch  - 1 x daily - 7 x weekly - 3 sets - 10 reps - Supine Piriformis Stretch  - 2 x daily - 7 x weekly - 1 sets - 3 reps - 30 second hold - Sidelying Hip Abduction  - 1 x daily - 7 x weekly - 3 sets - 10 reps - Supine Hamstring Stretch with Strap  - 1 x daily - 7 x weekly - 1 sets - 3-5 reps - 30 hold - Supine ITB Stretch with Strap  -  1 x daily - 7 x weekly - 1 sets - 3-5 reps - 30 hold - Clamshell with Resistance  - 1 x daily - 7 x weekly - 2 sets - 10 reps - 5 hold   ASSESSMENT:  CLINICAL IMPRESSION:   Patient is at the end of her plan of care today but she does have 2 more visits scheduled.  We chose to wrap up physical therapy today as she feels like she has everything she needs to manage her symptoms from this point.  She continues to have minimal tension in the proximal right hamstring but it does not limit her with her day-to-day activities.  She feels like her leg is stronger than when she came in and has also toned down her exercise routine.  She may consider Pilates in the future as it did benefit her and was given cash based dry needling  information for continued intermittent manual work.   OBJECTIVE IMPAIRMENTS: decreased mobility, decreased strength, increased fascial restrictions, impaired flexibility, and pain.   ACTIVITY LIMITATIONS: squatting, transfers, and locomotion level  PARTICIPATION LIMITATIONS: community activity  PERSONAL FACTORS: Behavior pattern, Past/current experiences, and 1-2 comorbidities: Mild orthopedic injuries foot and shoulder  are also affecting patient's functional  outcome.   REHAB POTENTIAL: Excellent  CLINICAL DECISION MAKING: Stable/uncomplicated  EVALUATION COMPLEXITY: Low   GOALS: Goals reviewed with patient? Yes  SHORT TERM GOALS= LONG TERM GOALS  Target date: 01/08/2023     Patient will be independent with home exercise program Baseline: Goal status:MET   2.  Patient will demonstrate improved perceived mobility and functional strength with step ups and step downs Baseline:  Goal status: MET    3.  Patient will be able to cross her right leg over the left with ease to improve lower body dressing Baseline:  Goal status:MET  4.  Patient will increase hip abduction strength to 4+/5 bilaterally Baseline:  Goal status: MET  5.  Patient will demonstrate proper squat mechanics without cues and without increased pain Baseline:  Goal status:min cues needed   6.  Foto score will improve to 86% Baseline:  Goal status: INITIAL, NT    PLAN:  PT FREQUENCY: 2x/week  PT DURATION: 6 weeks  PLANNED INTERVENTIONS: 97164- PT Re-evaluation, 97110-Therapeutic exercises, 97530- Therapeutic activity, 97112- Neuromuscular re-education, 97535- Self Care, 91478- Manual therapy, 97014- Electrical stimulation (unattended), 97033- Ionotophoresis 4mg /ml Dexamethasone, Patient/Family education, Dry Needling, Joint mobilization, Spinal mobilization, Cryotherapy, and Moist heat  PLAN FOR NEXT SESSION: DC from PT    Coy Vandoren, PT 01/07/2023, 1:21 PM   Karie Mainland, PT 01/07/23 1:21 PM Phone: [phone] Fax: [phone]

## 2023-01-07 ENCOUNTER — Other Ambulatory Visit: Payer: Self-pay | Admitting: Family Medicine

## 2023-01-07 ENCOUNTER — Telehealth (INDEPENDENT_AMBULATORY_CARE_PROVIDER_SITE_OTHER): Payer: Self-pay | Admitting: Otolaryngology

## 2023-01-07 ENCOUNTER — Ambulatory Visit: Payer: Managed Care, Other (non HMO) | Admitting: Physical Therapy

## 2023-01-07 DIAGNOSIS — R252 Cramp and spasm: Secondary | ICD-10-CM

## 2023-01-07 DIAGNOSIS — M25551 Pain in right hip: Secondary | ICD-10-CM

## 2023-01-07 DIAGNOSIS — M2560 Stiffness of unspecified joint, not elsewhere classified: Secondary | ICD-10-CM

## 2023-01-07 DIAGNOSIS — M6281 Muscle weakness (generalized): Secondary | ICD-10-CM

## 2023-01-07 NOTE — Telephone Encounter (Signed)
Patient called concerned about upcoming surgery.  Has questions and is feeling under the weather - question if surgery needs to be rescheduled or can still take place.  Please call.  [phone]

## 2023-01-08 ENCOUNTER — Other Ambulatory Visit: Payer: Self-pay

## 2023-01-08 ENCOUNTER — Telehealth (INDEPENDENT_AMBULATORY_CARE_PROVIDER_SITE_OTHER): Payer: Self-pay | Admitting: Otolaryngology

## 2023-01-08 ENCOUNTER — Telehealth: Payer: Self-pay

## 2023-01-08 MED ORDER — OMEPRAZOLE MAGNESIUM 20 MG PO TBEC: 20.0000 mg | DELAYED_RELEASE_TABLET | Freq: Every day | ORAL | 3 refills | Status: DC

## 2023-01-08 NOTE — Telephone Encounter (Signed)
You may refill omeprazole 20 mg daily #90 with 3 refills

## 2023-01-08 NOTE — Telephone Encounter (Signed)
Patient called yesterday and stated that she is not feeling well and may have a viral infection. Per Dr. Suszanne Conners, I called in Augmentin 875 mg BID  for 7 days at [facility] on [address].

## 2023-01-08 NOTE — Telephone Encounter (Signed)
Copied from CRM [id number]. Topic: Clinical - Medication Refill >> Jan 08, 2023 12:23 PM Almira Coaster wrote: Most Recent Primary Care Visit:  Provider: Lutricia Horsfall MARIE  Department: [facility]  Visit Type: OFFICE VISIT  Date: 12/25/2022  Medication: omeprazole (PRILOSEC OTC) 20 MG tablet  Has the patient contacted their pharmacy? Yes, they don't have any refills  (Agent: If no, request that the patient contact the pharmacy for the refill. If patient does not wish to contact the pharmacy document the reason why and proceed with request.) (Agent: If yes, when and what did the pharmacy advise?) Yes Is this the correct pharmacy for this prescription?  If no, delete pharmacy and type the correct one.  This is the patient's preferred pharmacy:  [id number] [place], [place] - [address] AT [facility] [address] & [facility] [address] [place] [address] Phone: [phone] Fax: [phone]   Has the prescription been filled recently? Yes  Is the patient out of the medication? No, patient has 3 days left  Has the patient been seen for an appointment in the last year OR does the patient have an upcoming appointment? Yes  Can we respond through MyChart? Yes  Agent: Please be advised that Rx refills may take up to 3 business days. We ask that you follow-up with your pharmacy.  Is this ok to fill? This is in Historical.

## 2023-01-08 NOTE — Telephone Encounter (Signed)
Rx sent 

## 2023-01-12 ENCOUNTER — Ambulatory Visit: Payer: Managed Care, Other (non HMO) | Admitting: Physical Therapy

## 2023-01-14 ENCOUNTER — Encounter: Payer: Self-pay | Admitting: Family Medicine

## 2023-01-15 ENCOUNTER — Ambulatory Visit: Payer: Managed Care, Other (non HMO)

## 2023-01-15 ENCOUNTER — Telehealth (INDEPENDENT_AMBULATORY_CARE_PROVIDER_SITE_OTHER): Payer: Self-pay | Admitting: Otolaryngology

## 2023-01-15 NOTE — Telephone Encounter (Signed)
Patient called and stated that she is coughing up thick yellowish mucus. Per Dr. Suszanne Conners, I called in Levofloxacin 500 mg QD for 7 days. [facility] on [address] [place]

## 2023-01-18 ENCOUNTER — Ambulatory Visit: Payer: Managed Care, Other (non HMO) | Admitting: Family Medicine

## 2023-01-19 ENCOUNTER — Other Ambulatory Visit (INDEPENDENT_AMBULATORY_CARE_PROVIDER_SITE_OTHER): Payer: Self-pay | Admitting: Otolaryngology

## 2023-01-19 DIAGNOSIS — J3489 Other specified disorders of nose and nasal sinuses: Secondary | ICD-10-CM | POA: Diagnosis not present

## 2023-01-19 DIAGNOSIS — J342 Deviated nasal septum: Secondary | ICD-10-CM | POA: Diagnosis not present

## 2023-01-19 DIAGNOSIS — J338 Other polyp of sinus: Secondary | ICD-10-CM | POA: Diagnosis not present

## 2023-01-19 DIAGNOSIS — J343 Hypertrophy of nasal turbinates: Secondary | ICD-10-CM | POA: Diagnosis not present

## 2023-01-19 DIAGNOSIS — J320 Chronic maxillary sinusitis: Secondary | ICD-10-CM | POA: Diagnosis not present

## 2023-01-19 HISTORY — PX: NASAL SINUS SURGERY: SHX719

## 2023-01-19 MED ORDER — AMOXICILLIN 875 MG PO TABS: 875.0000 mg | ORAL_TABLET | Freq: Two times a day (BID) | ORAL | 0 refills | Status: AC

## 2023-01-21 LAB — SURGICAL PATHOLOGY

## 2023-01-22 ENCOUNTER — Encounter (INDEPENDENT_AMBULATORY_CARE_PROVIDER_SITE_OTHER): Payer: Self-pay

## 2023-01-22 ENCOUNTER — Ambulatory Visit (INDEPENDENT_AMBULATORY_CARE_PROVIDER_SITE_OTHER): Payer: Managed Care, Other (non HMO) | Admitting: Physician Assistant

## 2023-01-22 DIAGNOSIS — J342 Deviated nasal septum: Secondary | ICD-10-CM

## 2023-01-22 NOTE — Progress Notes (Signed)
 Dear Dr. Katrinka, Here is my assessment for our mutual patient, Kimberly Stevens. Thank you for allowing me the opportunity to care for your patient. Please do not hesitate to contact me should you have any other questions. Sincerely, Chyrl Cohen PA-C  Otolaryngology Clinic Note Referring provider: Dr. Katrinka HPI:  Kimberly Stevens is a 58 y.o. female kindly referred by Dr. Katrinka   The patient is seen today POD # 3 SP bilateral maxillary antrostomy with polyp removal, septoplasty, bilateral turbinate reduction, and bilateral endoscopic concha bullosa resection  by Dr. Karis on 01/19/23. Postoperatively the patient notes she has been doing well. The first several days she did experience throat discomfort which she attributed to her recent URI and intubation, this has improved. She notes some minimal blood drainage from the nose as well as crusting. No other significant complaints or concerns.   H&N Surgery: septoplasty/turbinate reduction  Independent Review of Additional Tests or Records:   none  PMH/Meds/All/SocHx/FamHx/ROS:   Past Medical History:  Diagnosis Date   Allergy    seasonal allergies   Arthritis    left great toe joint   Broken arm    right elbow & wrist   Colon polyps    benign   Depression    hx of post partum   Dermoid cyst of ovary    Eye disorder    possible glaucoma-being followed by Dr Carmelia   Fibrocystic breast    IBS (irritable bowel syndrome)    Plantar fasciitis    left foot 2/15-11/15   Polyp of duodenum 2014   tiny   Tendonitis    right knee 2/15-11/15   Torn rotator cuff    right     Past Surgical History:  Procedure Laterality Date   BROW LIFT Bilateral 02/22/2020   Procedure: BLEPHAROPLASTY UPPER EYELID; W/EXCESS SKIN BILATERAL;  Surgeon: Ashley Greig HERO, MD;  Location: [facility];  Service: Ophthalmology;  Laterality: Bilateral;   COLONOSCOPY  2014   JP-MAC-movi(exc)-poylpoid   POLYPECTOMY  2014   polypoid   WISDOM TOOTH EXTRACTION       Family History  Problem Relation Age of Onset   Arthritis Paternal Grandmother    Arthritis Maternal Grandmother    Heart attack Maternal Grandmother        age 39   Multiple myeloma Father        42 in 2022. around mid 60s.   Diverticulitis Father    Hearing loss Father        and poor vision   Hepatitis Father        B she believes   Macular degeneration Father    Leukemia Father    Conductive hearing loss Father    Arthritis Mother        38 in 2022. shoulder, knee surgery.   Healthy Sister    Healthy Sister    Leukemia Maternal Uncle    Colon cancer Neg Hx    Colon polyps Neg Hx    Liver disease Neg Hx    Pancreatic cancer Neg Hx    Esophageal cancer Neg Hx    Stomach cancer Neg Hx    Rectal cancer Neg Hx      Social Connections: Socially Integrated (07/20/2022)   Social Connection and Isolation Panel [NHANES]    Frequency of Communication with Friends and Family: More than three times a week    Frequency of Social Gatherings with Friends and Family: More than three times a week    Attends  Religious Services: More than 4 times per year    Active Member of Clubs or Organizations: Yes    Attends Banker Meetings: More than 4 times per year    Marital Status: Married      Current Outpatient Medications:    amoxicillin  (AMOXIL ) 875 MG tablet, Take 1 tablet (875 mg total) by mouth 2 (two) times daily for 3 days., Disp: 6 tablet, Rfl: 0   Albuterol -Budesonide (AIRSUPRA ) 90-80 MCG/ACT AERO, Inhale 2 puffs into the lungs every 4 (four) hours as needed., Disp: 10.7 g, Rfl: 0   amoxicillin -clavulanate (AUGMENTIN ) 875-125 MG tablet, Take 1 tablet by mouth 2 (two) times daily., Disp: 20 tablet, Rfl: 0   famotidine (PEPCID) 20 MG tablet, Take 20 mg by mouth 2 (two) times daily., Disp: , Rfl:    Fexofenadine HCl (ALLEGRA PO), Take by mouth., Disp: , Rfl:    omeprazole  (PRILOSEC  OTC) 20 MG tablet, Take 1 tablet (20 mg total) by mouth daily., Disp: 90 tablet,  Rfl: 3   Physical Exam:   LMP 06/20/2017 (Approximate)   Pertinent Findings  CN II-XII intact Anterior rhinoscopy: Septum midline, SP doyle splint removal incisions clean with routine healing, no active bleeding, scant blood noted, numbness to upper lip  No obviously palpable neck masses/lymphadenopathy/thyromegaly No respiratory distress or stridor  Seprately Identifiable Procedures:  None  Impression & Plans:  Kimberly Stevens is a 58 y.o. female with the following   SP Bilateral maxillary antrostomy with polyp removal, septoplasty, bilateral turbinate reduction, and bilateral endoscopic concha bullosa resection   Doing well today, with some discomfort but overall tolerable. Splints removed today without issue, no bleeding. Plan for nasal irrigation for three weeks, tylenol  as needed for pain. She understands to call with any questions or concerns she may have. She is happy with today's plan, no further or concerns.    - f/u Will call Monday to schedule follow up evaluation.    Thank you for allowing me the opportunity to care for your patient. Please do not hesitate to contact me should you have any other questions.  Sincerely, Chyrl Cohen PA-C [facility] Phone: [phone] Fax: [phone]  01/22/2023, 2:30 PM

## 2023-01-25 ENCOUNTER — Encounter (INDEPENDENT_AMBULATORY_CARE_PROVIDER_SITE_OTHER): Payer: Managed Care, Other (non HMO)

## 2023-02-03 ENCOUNTER — Encounter (INDEPENDENT_AMBULATORY_CARE_PROVIDER_SITE_OTHER): Payer: Self-pay

## 2023-02-03 ENCOUNTER — Ambulatory Visit (INDEPENDENT_AMBULATORY_CARE_PROVIDER_SITE_OTHER): Payer: Managed Care, Other (non HMO) | Admitting: Otolaryngology

## 2023-02-03 VITALS — BP 116/78 | HR 71 | Ht 64.0 in | Wt 132.0 lb

## 2023-02-03 DIAGNOSIS — J320 Chronic maxillary sinusitis: Secondary | ICD-10-CM | POA: Diagnosis not present

## 2023-02-03 DIAGNOSIS — J338 Other polyp of sinus: Secondary | ICD-10-CM | POA: Diagnosis not present

## 2023-02-04 NOTE — Progress Notes (Signed)
Patient ID: Kimberly Stevens, female   DOB: 24-Jun-1965, 58 y.o.   MRN: [id number]  Procedure: Bilateral nasal/sinus debridement status post endoscopic sinus surgery.  Indication: The patient previously underwent bilateral endoscopic sinus surgery to treat her bilateral chronic maxillary sinusitis and polyposis on January 19, 2023.  The patient returns today complaining of significant nasal congestion, drainage, and crusting.  She had no significant bleeding from the nasal cavities.  Currently the patient denies any facial pain, fever, or visual change.  Anesthesia: Topical Xylocaine and Neo-Synephrine.  Description: The patient is placed upright in the exam chair. Both nasal cavities are sprayed with topical Xylocaine and Neo-Synephrine.  A 0 rigid endoscope is used for the examination. The scope is first advanced past the right nostril into the right nasal cavity. A significant amount of crusting and dry blood clots are noted within the right nasal cavity and the maxillary cavity.  The crusting and dry blood clots are removed with suction catheters and alligator forceps, which are inserted in parallel with the rigid endoscope.  After the debridement procedure, the maxillary antrum is noted to be widely patent.    The same procedure is then repeated on the left side without exception. Similar findings are again noted on the left. The patient tolerated the procedure well.  Follow up care: The patient is instructed to perform daily nasal saline irrigation.  The patient will return for re-evaluation in approximately 2 weeks.

## 2023-02-10 ENCOUNTER — Telehealth (INDEPENDENT_AMBULATORY_CARE_PROVIDER_SITE_OTHER): Payer: Self-pay | Admitting: Otolaryngology

## 2023-02-10 NOTE — Telephone Encounter (Signed)
LVM to confirm appt & location [id number] afm

## 2023-02-11 ENCOUNTER — Ambulatory Visit (INDEPENDENT_AMBULATORY_CARE_PROVIDER_SITE_OTHER): Payer: Managed Care, Other (non HMO) | Admitting: Otolaryngology

## 2023-02-11 VITALS — BP 116/67 | Ht 64.0 in | Wt 134.0 lb

## 2023-02-11 DIAGNOSIS — J320 Chronic maxillary sinusitis: Secondary | ICD-10-CM | POA: Diagnosis not present

## 2023-02-11 DIAGNOSIS — J338 Other polyp of sinus: Secondary | ICD-10-CM | POA: Diagnosis not present

## 2023-02-12 NOTE — Progress Notes (Signed)
Patient ID: Kimberly Stevens, female   DOB: Apr 06, 1965, 58 y.o.   MRN: [id number]  Procedure: Bilateral nasal/sinus debridement status post endoscopic sinus surgery.   Indication: The patient is a 58 year old female who returns today for her follow-up evaluation.  The patient previously underwent bilateral endoscopic sinus surgery to treat her bilateral chronic maxillary sinusitis and polyposis on January 19, 2023.  The patient returns today complaining of persistent nasal crusting.  Her nasal congestion has mildly improved.  She had no significant bleeding from the nasal cavities.  Currently the patient denies any facial pain, fever, or visual change.   Anesthesia: Topical Xylocaine and Neo-Synephrine.   Description: The patient is placed upright in the exam chair. Both nasal cavities are sprayed with topical Xylocaine and Neo-Synephrine.  A 0 rigid endoscope is used for the examination. The scope is first advanced past the right nostril into the right nasal cavity. A moderate amount of crusting is noted within the right nasal cavity and the maxillary cavity.  The crusting is removed with suction catheters and alligator forceps, which are inserted in parallel with the rigid endoscope.  After the debridement procedure, the maxillary antrum is noted to be widely patent.    The same procedure is then repeated on the left side without exception. Similar findings are again noted on the left. The patient tolerated the procedure well.   Follow up care: The patient is instructed to continue daily nasal saline irrigation.  The patient will return for re-evaluation in approximately 4 weeks.

## 2023-02-15 ENCOUNTER — Telehealth (INDEPENDENT_AMBULATORY_CARE_PROVIDER_SITE_OTHER): Payer: Self-pay

## 2023-02-15 ENCOUNTER — Encounter: Payer: Self-pay | Admitting: Family Medicine

## 2023-02-15 NOTE — Telephone Encounter (Signed)
Patient called and said she has sinus infection, sore throat, green drainage going in back of throat.  Per Dr. Suszanne Conners he sent in augmentin 875mg  Bid for 7 days if patient doesn't feel any better he wants to see her in office.

## 2023-02-15 NOTE — Telephone Encounter (Signed)
Spoke with pharmacy at 1:27pm augmentin 875mg  BID for 7 days.

## 2023-02-17 ENCOUNTER — Telehealth (INDEPENDENT_AMBULATORY_CARE_PROVIDER_SITE_OTHER): Payer: Self-pay | Admitting: Otolaryngology

## 2023-02-17 NOTE — Telephone Encounter (Signed)
Patient called yesterday and stated that she has a trace of blood in her phlegm when she was coughing. Per dr.Teoh this was normal. She also wanted to know if she can use any antihistamines. I left her a voice mail that she can  try either  Allegra or Sudafed.

## 2023-03-08 ENCOUNTER — Telehealth (INDEPENDENT_AMBULATORY_CARE_PROVIDER_SITE_OTHER): Payer: Self-pay | Admitting: Otolaryngology

## 2023-03-08 NOTE — Telephone Encounter (Signed)
 Patient called today and was c/o congestion, cough with dark phlegm. She feels like she is getting a sinus infection. Per Dr. Suszanne Conners I called in Augmentin 875 mg  BID for 7 days .

## 2023-03-16 ENCOUNTER — Telehealth (INDEPENDENT_AMBULATORY_CARE_PROVIDER_SITE_OTHER): Payer: Self-pay | Admitting: Otolaryngology

## 2023-03-16 NOTE — Telephone Encounter (Signed)
 Confirmed appt & location [id number] afm

## 2023-03-17 ENCOUNTER — Encounter (INDEPENDENT_AMBULATORY_CARE_PROVIDER_SITE_OTHER): Payer: Self-pay

## 2023-03-17 ENCOUNTER — Ambulatory Visit (INDEPENDENT_AMBULATORY_CARE_PROVIDER_SITE_OTHER): Payer: Managed Care, Other (non HMO)

## 2023-03-17 VITALS — BP 125/79 | HR 77 | Ht 64.0 in | Wt 134.0 lb

## 2023-03-17 DIAGNOSIS — J338 Other polyp of sinus: Secondary | ICD-10-CM | POA: Diagnosis not present

## 2023-03-17 DIAGNOSIS — J320 Chronic maxillary sinusitis: Secondary | ICD-10-CM

## 2023-03-17 MED ORDER — FLUTICASONE PROPIONATE 50 MCG/ACT NA SUSP: 2.0000 | Freq: Every day | NASAL | 10 refills | Status: DC

## 2023-03-18 NOTE — Progress Notes (Signed)
 Patient ID: Kimberly Stevens, female   DOB: 18-Apr-1965, 58 y.o.   MRN: [id number]  Follow-up: Bilateral chronic maxillary sinus situs and polyposis   Procedure: Bilateral nasal/sinus debridement status post endoscopic sinus surgery.   Indication: The patient is a 58 year old female who returns today for her follow-up evaluation.  The patient has a history of bilateral chronic maxillary sinusitis and polyposis.  She underwent bilateral endoscopic sinus surgery on January 19, 2023.  The patient returns today complaining of persistent nasal crusting.  Her nasal congestion has improved.  Currently the patient denies any facial pain, fever, or visual change.   Anesthesia: Topical Xylocaine and Neo-Synephrine.   Description: The patient is placed upright in the exam chair. Both nasal cavities are sprayed with topical Xylocaine and Neo-Synephrine.  A 0 rigid endoscope is used for the examination. The scope is first advanced past the right nostril into the right nasal cavity. A moderate amount of crusting is noted within the right nasal cavity and the maxillary cavity.  The crusting is removed with suction catheters and alligator forceps, which are inserted in parallel with the rigid endoscope.  After the debridement procedure, the maxillary antrum is noted to be patent.    The same procedure is then repeated on the left side without exception. Similar findings are again noted on the left.  No purulent drainage is noted today.  The patient tolerated the procedure well.   Follow up care: The patient is instructed to continue nasal saline irrigation as needed.  The patient is instructed to restart Flonase nasal spray 2 sprays each nostril daily.  The patient will return for re-evaluation in approximately 6 months.

## 2023-03-22 ENCOUNTER — Ambulatory Visit (HOSPITAL_BASED_OUTPATIENT_CLINIC_OR_DEPARTMENT_OTHER): Payer: Managed Care, Other (non HMO) | Admitting: Obstetrics & Gynecology

## 2023-03-22 ENCOUNTER — Encounter (HOSPITAL_BASED_OUTPATIENT_CLINIC_OR_DEPARTMENT_OTHER): Payer: Self-pay | Admitting: Obstetrics & Gynecology

## 2023-03-22 VITALS — BP 114/83 | HR 70 | Ht 64.0 in | Wt 133.0 lb

## 2023-03-22 DIAGNOSIS — N393 Stress incontinence (female) (male): Secondary | ICD-10-CM

## 2023-03-22 DIAGNOSIS — N644 Mastodynia: Secondary | ICD-10-CM

## 2023-03-22 NOTE — Progress Notes (Signed)
 GYNECOLOGY  VISIT  CC:   breast pain  HPI: 58 y.o. [id number] Married White or Caucasian female here for complaint of breast pain in her left axilla and some tenderness in her upper left breast.  This has been going on for "awhile".  Last mammogram was 08/2022 and was normal.  Her mammogram in 2023, did have a possible finding and follow up imaging was recommended and completed.    She has been having issues with sinus issues and she did have sinus surgery 01/19/2023.  She is still feeling some drainage, still, and she is having cough and throat clearing.  She is feeling fatigue as well and she's not completely sure of the cause.    Unrelated, she is having issues with SUI.  She has done pelvic PT.  She does not drink caffeine except chocolate.  She can't run now because she leaks all of the time.  This issue has changed her activity level.  She is ready to consider definitive surgical treatment.  Urogynecology referral, possible need for urodynamics discussed.   Past Medical History:  Diagnosis Date   Allergy    seasonal allergies   Arthritis    left great toe joint   Broken arm    right elbow & wrist   Colon polyps    benign   Depression    hx of post partum   Dermoid cyst of ovary    Eye disorder    possible glaucoma-being followed by Dr Deatra James   Fibrocystic breast    IBS (irritable bowel syndrome)    Plantar fasciitis    left foot 2/15-11/15   Polyp of duodenum 2014   tiny   Tendonitis    right knee 2/15-11/15   Torn rotator cuff    right    MEDS:   Current Outpatient Medications on File Prior to Visit  Medication Sig Dispense Refill   Albuterol-Budesonide (AIRSUPRA) 90-80 MCG/ACT AERO Inhale 2 puffs into the lungs every 4 (four) hours as needed. 10.7 g 0   famotidine (PEPCID) 20 MG tablet Take 20 mg by mouth 2 (two) times daily.     fluticasone (FLONASE) 50 MCG/ACT nasal spray Place 2 sprays into both nostrils daily. 16 g 10   Multiple Vitamin (MULTIVITAMIN ADULT PO) Take  by mouth.     omeprazole (PRILOSEC OTC) 20 MG tablet Take 1 tablet (20 mg total) by mouth daily. 90 tablet 3   VITAMIN D, CHOLECALCIFEROL, PO Take by mouth.     fexofenadine (ALLEGRA) 180 MG tablet Take 180 mg by mouth daily. (Patient not taking: Reported on 03/22/2023)     No current facility-administered medications on file prior to visit.    ALLERGIES: Gluten meal and Lactose intolerance (gi)  SH:  married, non smoker  Review of Systems  Constitutional: Negative.   Genitourinary:        Urinary incontinence    PHYSICAL EXAMINATION:    BP 114/83 (Cuff Size: Normal)   Pulse 70   Ht 5\' 4"  (1.626 m)   Wt 133 lb (60.3 kg)   LMP 06/20/2017 (Approximate)   BMI 22.83 kg/m     Physical Exam Constitutional:      Appearance: Normal appearance.  Chest:  Breasts:    Right: Normal. No inverted nipple, mass, nipple discharge, skin change or tenderness.     Left: Tenderness present. No inverted nipple, mass, nipple discharge or skin change.    Lymphadenopathy:     Upper Body:     Right upper  body: No supraclavicular, axillary or pectoral adenopathy.     Left upper body: No supraclavicular, axillary or pectoral adenopathy.  Neurological:     General: No focal deficit present.     Mental Status: She is alert.  Psychiatric:        Mood and Affect: Mood normal.      Assessment/Plan: 1. Breast pain, left (Primary) - will proceed with diagnostic imaging.  If negative and pt still concerned, will consider breat MRI.  Discussed today. - MM 3D DIAGNOSTIC MAMMOGRAM UNILATERAL LEFT BREAST; Future - [place] LIMITED ULTRASOUND INCLUDING AXILLA LEFT BREAST ; Future  2. Primary stress urinary incontinence - Ambulatory referral to Urogynecology

## 2023-03-22 NOTE — Patient Instructions (Signed)
Urogynecology [address], [place], [place] [address]  Phone: [phone]

## 2023-03-24 ENCOUNTER — Ambulatory Visit

## 2023-03-24 ENCOUNTER — Ambulatory Visit
Admission: RE | Admit: 2023-03-24 | Discharge: 2023-03-24 | Disposition: A | Discharge status: Home / Self Care | Source: Ambulatory Visit | Attending: Obstetrics & Gynecology | Admitting: Obstetrics & Gynecology

## 2023-03-24 DIAGNOSIS — N644 Mastodynia: Secondary | ICD-10-CM

## 2023-04-13 ENCOUNTER — Other Ambulatory Visit (HOSPITAL_BASED_OUTPATIENT_CLINIC_OR_DEPARTMENT_OTHER): Payer: Self-pay | Admitting: *Deleted

## 2023-04-13 ENCOUNTER — Encounter (HOSPITAL_BASED_OUTPATIENT_CLINIC_OR_DEPARTMENT_OTHER): Payer: Self-pay | Admitting: Obstetrics & Gynecology

## 2023-04-13 MED ORDER — NITROFURANTOIN MACROCRYSTAL 100 MG PO CAPS: ORAL_CAPSULE | ORAL | 0 refills | Status: DC

## 2023-04-13 NOTE — Progress Notes (Signed)
 Pt needs refill on macrobid. She is out of town. Refill sent to requested pharmacy.

## 2023-04-20 ENCOUNTER — Ambulatory Visit (INDEPENDENT_AMBULATORY_CARE_PROVIDER_SITE_OTHER): Payer: 59 | Admitting: Family Medicine

## 2023-04-20 ENCOUNTER — Encounter: Payer: Self-pay | Admitting: Family Medicine

## 2023-04-20 VITALS — BP 102/70 | HR 68 | Temp 97.2°F | Ht 64.0 in | Wt 127.8 lb

## 2023-04-20 DIAGNOSIS — Z23 Encounter for immunization: Secondary | ICD-10-CM

## 2023-04-20 DIAGNOSIS — Z13220 Encounter for screening for lipoid disorders: Secondary | ICD-10-CM

## 2023-04-20 DIAGNOSIS — Z0000 Encounter for general adult medical examination without abnormal findings: Secondary | ICD-10-CM | POA: Diagnosis not present

## 2023-04-20 DIAGNOSIS — Z130 Encounter for screening for diseases of the blood and blood-forming organs and certain disorders involving the immune mechanism: Secondary | ICD-10-CM | POA: Diagnosis not present

## 2023-04-20 LAB — CBC WITH DIFFERENTIAL/PLATELET
Basophils Absolute: 0.1 10*3/uL (ref 0.0–0.1)
Basophils Relative: 1.6 % (ref 0.0–3.0)
Eosinophils Absolute: 0.2 10*3/uL (ref 0.0–0.7)
Eosinophils Relative: 4.6 % (ref 0.0–5.0)
HCT: 46.5 % — ABNORMAL HIGH (ref 36.0–46.0)
Hemoglobin: 15.4 g/dL — ABNORMAL HIGH (ref 12.0–15.0)
Lymphocytes Relative: 26.9 % (ref 12.0–46.0)
Lymphs Abs: 1.2 10*3/uL (ref 0.7–4.0)
MCHC: 33.1 g/dL (ref 30.0–36.0)
MCV: 97.9 fl (ref 78.0–100.0)
Monocytes Absolute: 0.4 10*3/uL (ref 0.1–1.0)
Monocytes Relative: 9.4 % (ref 3.0–12.0)
Neutro Abs: 2.7 10*3/uL (ref 1.4–7.7)
Neutrophils Relative %: 57.5 % (ref 43.0–77.0)
Platelets: 250 10*3/uL (ref 150.0–400.0)
RBC: 4.75 Mil/uL (ref 3.87–5.11)
RDW: 13.1 % (ref 11.5–15.5)
WBC: 4.6 10*3/uL (ref 4.0–10.5)

## 2023-04-20 LAB — COMPREHENSIVE METABOLIC PANEL WITH GFR
ALT: 18 U/L (ref 0–35)
AST: 21 U/L (ref 0–37)
Albumin: 4.5 g/dL (ref 3.5–5.2)
Alkaline Phosphatase: 72 U/L (ref 39–117)
BUN: 14 mg/dL (ref 6–23)
CO2: 28 meq/L (ref 19–32)
Calcium: 9.1 mg/dL (ref 8.4–10.5)
Chloride: 105 meq/L (ref 96–112)
Creatinine, Ser: 0.81 mg/dL (ref 0.40–1.20)
GFR: 79.87 mL/min (ref >60.00)
Glucose, Bld: 86 mg/dL (ref 70–99)
Potassium: 4.3 meq/L (ref 3.5–5.1)
Sodium: 139 meq/L (ref 135–145)
Total Bilirubin: 0.4 mg/dL (ref 0.2–1.2)
Total Protein: 7.2 g/dL (ref 6.0–8.3)

## 2023-04-20 LAB — LIPID PANEL
Cholesterol: 200 mg/dL (ref 0–200)
HDL: 82 mg/dL (ref >39.00)
LDL Cholesterol: 110 mg/dL — ABNORMAL HIGH (ref 0–99)
NonHDL: 118.46
Total CHOL/HDL Ratio: 2
Triglycerides: 44 mg/dL (ref 0.0–149.0)
VLDL: 8.8 mg/dL (ref 0.0–40.0)

## 2023-04-20 NOTE — Patient Instructions (Addendum)
 Please stop by lab before you go If you have mychart- we will send your results within 3 business days of [place] receiving them.  If you do not have mychart- we will call you about results within 5 business days of [place] receiving them.  *please also note that you will see labs on mychart as soon as they post. I will later go in and write notes on them- will say "notes from Dr. Durene Cal"   Consider bone density  Great job figuring out the diet coke trick!   Recommended follow up: Return in about 1 year (around 04/19/2024) for physical or sooner if needed.Schedule b4 you leave.

## 2023-04-20 NOTE — Progress Notes (Signed)
 Phone [phone]   Subjective:  Patient presents today for their annual physical. Chief complaint-noted.   See problem oriented charting- ROS- full  review of systems was completed and negative except for: cough, sinus congestion  The following were reviewed and entered/updated in epic: Past Medical History:  Diagnosis Date   Allergy    seasonal allergies   Arthritis    left great toe joint   Broken arm    right elbow & wrist   Colon polyps    benign   Depression    hx of post partum   Dermoid cyst of ovary    Eye disorder    possible glaucoma-being followed by Dr Deatra James   Fibrocystic breast    IBS (irritable bowel syndrome)    Plantar fasciitis    left foot 2/15-11/15   Polyp of duodenum 2014   tiny   Tendonitis    right knee 2/15-11/15   Torn rotator cuff    right   Patient Active Problem List   Diagnosis Date Noted   Chronic maxillary sinusitis 11/18/2022    Priority: Medium    Cough variant asthma 10/19/2022    Priority: Medium    Seasonal allergies 09/09/2020    Priority: Medium    Deviated nasal septum 11/18/2022    Priority: Low   Hypertrophy of nasal turbinates 11/18/2022    Priority: Low   Polyp of nasal sinus 11/18/2022    Priority: Low   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    Priority: Low   Postcoital UTI 09/09/2020    Priority: Low   Piriformis syndrome, right 11/03/2022    Priority: 1.   Hallux rigidus of both feet 07/15/2021    Priority: 1.   Costochondritis 03/20/2021    Priority: 1.   Strain of hamstring, subsequent encounter 08/25/2016    Priority: 1.   Plantar fasciitis of right foot 05/28/2016    Priority: 1.   Acquired hallux limitus of left foot 05/28/2016    Priority: 1.   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   Talipes cavus 12/28/2007    Priority: 1.   FOOT PAIN, CHRONIC 12/20/2007    Priority: 1.   Past Surgical History:  Procedure Laterality Date   BROW LIFT Bilateral 02/22/2020    Procedure: BLEPHAROPLASTY UPPER EYELID; W/EXCESS SKIN BILATERAL;  Surgeon: Imagene Riches, MD;  Location: [facility];  Service: Ophthalmology;  Laterality: Bilateral;   COLONOSCOPY  2014   JP-MAC-movi(exc)-poylpoid   NASAL SINUS SURGERY  01/19/2023   POLYPECTOMY  2014   polypoid   WISDOM TOOTH EXTRACTION      Family History  Problem Relation Age of Onset   Arthritis Paternal Grandmother    Arthritis Maternal Grandmother    Heart attack Maternal Grandmother        age 50   Multiple myeloma Father        85 in 2022. around mid 60s.   Diverticulitis Father    Hearing loss Father        and poor vision   Hepatitis Father        B she believes   Macular degeneration Father    Leukemia Father    Conductive hearing loss Father    Arthritis Mother        25 in 2022. shoulder, knee surgery.   Healthy Sister    Healthy Sister    Leukemia Maternal Uncle    Colon cancer Neg Hx    Colon  polyps Neg Hx    Liver disease Neg Hx    Pancreatic cancer Neg Hx    Esophageal cancer Neg Hx    Stomach cancer Neg Hx    Rectal cancer Neg Hx     Medications- reviewed and updated Current Outpatient Medications  Medication Sig Dispense Refill   Albuterol-Budesonide (AIRSUPRA) 90-80 MCG/ACT AERO Inhale 2 puffs into the lungs every 4 (four) hours as needed. 10.7 g 0   famotidine (PEPCID) 20 MG tablet Take 20 mg by mouth 2 (two) times daily.     fexofenadine (ALLEGRA) 180 MG tablet Take 180 mg by mouth daily.     Multiple Vitamin (MULTIVITAMIN ADULT PO) Take by mouth.     nitrofurantoin (MACRODANTIN) 100 MG capsule Takes 1 tablet after intercourse 30 capsule 0   omeprazole (PRILOSEC OTC) 20 MG tablet Take 1 tablet (20 mg total) by mouth daily. 90 tablet 3   VITAMIN D, CHOLECALCIFEROL, PO Take by mouth.     fluticasone (FLONASE) 50 MCG/ACT nasal spray Place 2 sprays into both nostrils daily. 16 g 10   No current facility-administered medications for this visit.    Allergies-reviewed and  updated Allergies  Allergen Reactions   Gluten Meal Diarrhea   Lactose Intolerance (Gi)     Social History   Social History Narrative   Married- husband Tasia Catchings patient of Dr. Durene Cal. 2 children- daughter 57- from [place] to [place] then [place]- getting married, son 70 in [place] in 2022      Network engineer at [employer]      Hobbies: peloton, tennis, walking daily, swimming, reading, Bible study, hiking   Objective  Objective:  BP 102/70   Pulse 68   Temp (!) 97.2 F (36.2 C)   Ht 5\' 4"  (1.626 m)   Wt 127 lb 12.8 oz (58 kg)   LMP 06/20/2017 (Approximate)   SpO2 96%   BMI 21.94 kg/m  Gen: NAD, resting comfortably HEENT: Mucous membranes are moist. Oropharynx normal Neck: no thyromegaly CV: RRR no murmurs rubs or gallops Lungs: CTAB no crackles, wheeze, rhonchi Abdomen: soft/nontender/nondistended/normal bowel sounds. No rebound or guarding.  Ext: no edema Skin: warm, dry Neuro: grossly normal, moves all extremities, PERRLAb   Assessment and Plan   58 y.o. female presenting for annual physical.  Health Maintenance counseling: 1. Anticipatory guidance: Patient counseled regarding regular dental exams -q6 months, eye exams - yearly,  avoiding smoking and second hand smoke , limiting alcohol to 1 beverage per day- 4 a week, no illicit drugs.   2. Risk factor reduction:  Advised patient of need for regular exercise and diet rich and fruits and vegetables to reduce risk of heart attack and stroke.  Exercise-still walking and doing Peloton daily also plays pickle ball and golf.  Diet/weight management-has maintained healthy weight and diet.  Wt Readings from Last 3 Encounters:  04/20/23 127 lb 12.8 oz (58 kg)  03/22/23 133 lb (60.3 kg)  03/17/23 134 lb (60.8 kg)  3. Immunizations/screenings/ancillary studies-declines COVID vaccination and otherwise up-to-date- discussed MMR titers- opts out  Immunization History  Administered Date(s) Administered   Influenza, High Dose Seasonal PF  04/06/2019   Influenza,inj,Quad PF,6+ Mos 10/26/2017, 09/13/2018, 01/22/2021, 10/01/2021   Influenza-Unspecified 10/30/2016   Moderna Sars-Covid-2 Vaccination 03/22/2019, 04/18/2019, 12/27/2019   PNEUMOCOCCAL CONJUGATE-20 04/20/2023   Tdap 11/15/2012, 04/20/2023   Zoster Recombinant(Shingrix) 12/19/2017, 12/11/2018  4. Cervical cancer screening- follows with Dr. Hyacinth Meeker with Pap smear October 09, 2019 HPV negative so good for 5 years- has visit in  may -Also recently referred to urogynecology for primary stress urinary incontinence- considering surgery/mesh  5. Breast cancer screening-recently was having some left breast pain and had diagnostic mammogram on the left and ultrasound- reassuring- also received breast exam with Dr. Hyacinth Meeker- also had screening mammogram August 2024 reassuring 6. Colon cancer screening - history of sessile serrated polyp- 05/23/20 with Dr. Marina Goodell with 7 year repeat  7. Skin cancer screening-sees dermatology yearly. advised regular sunscreen use. Denies worrisome, changing, or new skin lesions.  8. Birth control/STD check- postmenopausal, only active with husband 9. Osteoporosis screening at 61- osteopenia noted 09/19/2020 with worst T-score -1.6-offered repeat today- will reach out if decides to do this  10. Smoking associated screening -never smoker  Status of chronic or acute concerns   #social update- golden doodle puppy 25 weeks old.   # Sinus surgery January 19, 2023 (95% blockage and deviated septum) with Dr. Suszanne Conners for chronic maxillary sinusitis and polyposis-has continued to have drainage, cough and throat clearing but improved but still not perfect. Coughing in particular better- gave up diet coke and treated reflux- omeprazole in am and Pepcid in evening. Also on Flonase in evening per Dr. Suszanne Conners -also has needed multiple debridements- very costly unfortunately -one more visit  #daytime sleepiness and snoring- we want to see how she does further out from surgery  but if persistent will reach out about pulmonary referral/sleep study  # Asthma-follows with Dr. Sherene Sires on [address] every 4 hours as needed- inhaler helps if uses before working out  -she may ask Dr. Sherene Sires about sleep apnea testing at follow up as well -he's also helping with reflux- she has adjusted diet  #hyperlipidemia S: Medication: None  -Thankfully CT cardiac scoring of 0 on 03/20/2021 Lab Results  Component Value Date   CHOL 243 (H) 10/01/2021   HDL 85.10 10/01/2021   LDLCALC 149 (H) 10/01/2021   TRIG 46.0 10/01/2021   CHOLHDL 3 10/01/2021  A/P: lipids are moderately high but risk lower based on CT- update lipids today- does a good job with healthy lifestyle   #Seasonal allergies S: Medication: Flonase 2 sprays each nostril  A/P: on Flonase mainly for sinuses right now- but off of allegra- can restart if needed.     #Postcoital UTIs-has nitrofurantoin on hand if needed   #Chronic diarrhea-extensive prior GI work-up without cause.  TSH has been normal.   -went away with stopping diet coke. Has cut down on chocolate  #Left chest pain-started immediately after a physical maneuver of lifting by up from around with left hand-was very positional related but had no issues with Peloton back in July 2023.  Reassuring EKG.  Rib films also reassuring - she  also think sits a pull that she is getting agitated- wants to trial swimming again to see if she can tolerate it -may contact Terrilee Files, DO [facility]   Recommended follow up: Return in about 1 year (around 04/19/2024) for physical or sooner if needed.Schedule b4 you leave. Future Appointments  Date Time Provider Department Center  06/01/2023  8:45 AM Nyoka Cowden, MD [facility] None  06/08/2023  1:15 PM Jerene Bears, MD [facility]  06/17/2023  8:00 AM Wyatt Haste T, MD [facility]  09/17/2023 10:40 AM [facility] [facility] None   Lab/Order associations: fasting   ICD-10-CM   1. Need for vaccination against  Streptococcus pneumoniae  Z23 Pneumococcal conjugate vaccine 20-valent (Prevnar 20)    2. Need for Tdap vaccination  Z23 Tdap vaccine greater  than or equal to 7yo IM      No orders of the defined types were placed in this encounter.   Return precautions advised.  Tana Conch, MD

## 2023-05-30 NOTE — Progress Notes (Unsigned)
 Kimberly Stevens, female    DOB: 02-12-1965   MRN: [id number]   Brief patient profile:  65  yowf never smoker no problem with resp prior to moving  to [place] 1999 with onset of rhinitis [place]  in 2013 with nasal congestion  rx astelin and singulair much worse nasal symptoms even worse p blephoropalsty in Feb 2022 and cough ever  since so referred  10/19/2022 by Dr Arlene Ben  for cough with sensation of pnds      History of Present Illness  10/19/2022  Pulmonary/ 1st office eval/Javelle Donigan  day #3 of augmentin   Chief Complaint  Patient presents with   Consult    Cough (2 years ) productive green. Pt is on a antibiotic, has tried an albuterol  inhaler. Did not find relief.    Dyspnea:  aerobic ex no  Cough: woken up like arm clock better p uses Hydrocone at hs  - worse when lie down q hs and when wakes up > green chunks / worse with laughter Sleep: flat bed  2 pillows  SABA use: tried saba didn't help/ ? Used DPI's that made it worse. (She can't remember name)  Pred helped the cough the most  Rec For cough/ wheeze/ rattle >   AirSupra   up to 2 pffs every 4 hours if needed  Prednisone  10 mg take  4 each am x 2 days,   2 each am x 2 days,  1 each am x 2 days and stop  Try prilosec  otc 20mg   Take 30-60 min before first meal of the day and Pepcid ac (famotidine) 20 mg one hour before bedtime until cough is completely gone for at least a week without the need for cough suppression GERD diet reviewed, bed blocks rec  Please schedule a follow up office visit in 4 weeks, sooner if needed  with all medications /inhalers/ solutions in hand  Allergy screen 10/19/2022 >  Eos 0.5 /  IgE  8  Sinus CT 10/21/22 1. Circumferential mucosal thickening affecting both maxillary sinuses, with layering fluid, more on the right than the left. This is consistent with maxillary rhinosinusitis. 2. Mild mucosal thickening and a small amount of layering fluid affecting the  sphenoid sinuses. 3. Obscuration of the ostiomeatal units by regional mucosal thickening. The patient does have a secondary ostium on the left side which is patent to the nasal passages. 4. Nasal septum bows 3 mm towards the right   Teoh surgery 01/19/23 surgical center  with persist globus but pt says was informed Dr Darlin Ehrlich  "doesn't do throat."    06/01/2023  f/u ov/Jahan Friedlander re: UACS/sinusitis  maint on prilosec  20 mg / pepcid 20 mg   did not  bring meds  Chief Complaint  Patient presents with   Follow-up    Cough is better, but not gone.  Had sinus surgery and thought that would would take care of it.  She gave up her diet coke.  Wakes up gagging on mucous.  Dyspnea:  swimming again / steps ok  Cough: 3 mornings a week when sits up cough so hard she starts choking / gagging @ usual  time of morning to wake up but no noct spells> min mucoid production Sleeping: flat bed with 2 pillows  resp cc  SABA use: seems to help cough      No obvious day to day or daytime variability or assoc   purulent sputum or mucus plugs or  hemoptysis or cp or chest tightness, subjective wheeze or overt   hb symptoms.    Also denies any obvious fluctuation of symptoms with weather or environmental changes or other aggravating or alleviating factors except as outlined above   No unusual exposure hx or h/o childhood pna/ asthma or knowledge of premature birth.  Current Allergies, Complete Past Medical History, Past Surgical History, Family History, and Social History were reviewed in [employer] record.  ROS  The following are not active complaints unless bolded Hoarseness, sore throat/globus sensation , dysphagia, dental problems, itching, sneezing,  nasal congestion or discharge of excess mucus or purulent secretions, ear ache,   fever, chills, sweats, unintended wt loss or wt gain, classically pleuritic or exertional cp,  orthopnea pnd or arm/hand swelling  or leg swelling, presyncope,  palpitations, abdominal pain, anorexia, nausea, vomiting, diarrhea  or change in bowel habits or change in bladder habits, change in stools or change in urine, dysuria, hematuria,  rash, arthralgias, visual complaints, headache, numbness, weakness or ataxia or problems with walking or coordination,  change in mood or  memory.        Current Meds  Medication Sig   Albuterol -Budesonide (AIRSUPRA ) 90-80 MCG/ACT AERO Inhale 2 puffs into the lungs every 4 (four) hours as needed.   famotidine (PEPCID) 20 MG tablet Take 20 mg by mouth 2 (two) times daily.   fexofenadine (ALLEGRA) 180 MG tablet Take 180 mg by mouth daily.   Multiple Vitamin (MULTIVITAMIN ADULT PO) Take by mouth.   nitrofurantoin  (MACRODANTIN ) 100 MG capsule Takes 1 tablet after intercourse   omeprazole  (PRILOSEC  OTC) 20 MG tablet Take 1 tablet (20 mg total) by mouth daily.   VITAMIN D , CHOLECALCIFEROL, PO Take by mouth.          Past Medical History:  Diagnosis Date   Allergy    seasonal allergies   Arthritis    left great toe joint   Broken arm    right elbow & wrist   Colon polyps    benign   Depression    hx of post partum   Dermoid cyst of ovary    Eye disorder    possible glaucoma-being followed by Dr Kennyth Pean   Fibrocystic breast    IBS (irritable bowel syndrome)    Plantar fasciitis    left foot 2/15-11/15   Polyp of duodenum 2014   tiny   Tendonitis    right knee 2/15-11/15   Torn rotator cuff    right      Objective:     Wt Readings from Last 3 Encounters:  06/01/23 133 lb (60.3 kg)  04/20/23 127 lb 12.8 oz (58 kg)  03/22/23 133 lb (60.3 kg)      Vital signs reviewed  06/01/2023  - Note at rest 02 sats  97% on RA   General appearance:    amb pleasant wf nad    HEENT : Oropharynx  clear  -no pnd/ no cobblestoning/ turbinates min swelling         NECK :  without  apparent JVD/ palpable Nodes/TM    LUNGS: no acc muscle use,  Nl contour chest which is clear to A and P bilaterally without cough  on insp or exp maneuvers   CV:  RRR  no s3 or murmur or increase in P2, and no edema   ABD:  soft and nontender   MS:  Gait nl   ext warm without deformities Or obvious joint restrictions  calf  tenderness, cyanosis or clubbing    SKIN: warm and dry without lesions    NEURO:  alert, approp, nl sensorium with  no motor or cerebellar deficits apparent.          Assessment

## 2023-06-01 ENCOUNTER — Ambulatory Visit (INDEPENDENT_AMBULATORY_CARE_PROVIDER_SITE_OTHER): Admitting: Internal Medicine

## 2023-06-01 ENCOUNTER — Encounter: Payer: Self-pay | Admitting: Internal Medicine

## 2023-06-01 VITALS — BP 106/70 | HR 72 | Temp 98.2°F | Ht 64.0 in | Wt 133.0 lb

## 2023-06-01 DIAGNOSIS — R058 Other specified cough: Secondary | ICD-10-CM | POA: Diagnosis not present

## 2023-06-01 DIAGNOSIS — J45991 Cough variant asthma: Secondary | ICD-10-CM

## 2023-06-01 MED ORDER — PANTOPRAZOLE SODIUM 40 MG PO TBEC: 40.0000 mg | DELAYED_RELEASE_TABLET | Freq: Every day | ORAL | 2 refills | Status: DC

## 2023-06-01 NOTE — Patient Instructions (Addendum)
 Try air supra up to 2 puffs every 4 hours as needed   Pantoprazole (protonix) 40 mg   Take  30-60 min before first meal of the day and Pepcid (famotidine)  20 mg at bedtime  until return to office - this is the best way to tell whether stomach acid is contributing to your problem.      GERD (REFLUX)  is an extremely common cause of respiratory symptoms just like yours , many times with no obvious heartburn at all.    It can be treated with medication, but also with lifestyle changes including elevation of the head of your bed (ideally with 6 -8inch blocks under the headboard of your bed),  Smoking cessation, avoidance of late meals, excessive alcohol, and avoid fatty foods, chocolate, peppermint, colas, red wine, and acidic juices such as orange juice.  NO MINT OR MENTHOL PRODUCTS SO NO COUGH DROPS  USE SUGARLESS CANDY INSTEAD (Jolley ranchers or Stover's or Life Savers) or even ice chips will also do - the key is to swallow to prevent all throat clearing. NO OIL BASED VITAMINS - use powdered substitutes.  Avoid fish oil when coughing.   My office will be contacting you by phone for referral to Dr Marne Sings [phone]   - if you don't hear back from my office within one week please call us  back or notify us  thru MyChart and we'll address it right away.    Please schedule a follow up office visit in 6 weeks, call sooner if needed

## 2023-06-02 NOTE — Assessment & Plan Note (Addendum)
 Onset was Feb 2022 on a background  of chronic rhinitis  - Augmentin  rx 10/17/22 >>> - Allergy screen 10/19/2022 >  Eos 0.5 /  IgE  8 - 10/19/2022  After extensive coaching inhaler device,  effectiveness =    75% from a baseline of 25% > Air surpa trial plus pred x 6 days/ max gerd rx and 4 week f/u  -  10/21/22 sinus CT sinusitis (see upper airway cough syndrome)  - 06/01/2023  After extensive coaching inhaler device,  effectiveness =    75% (short ti) > continue air supra up to 2 q 4 h and if not helpful then stop it altogether as much of her problem is likely UACS at this point.       F/u here in 6 weeks, sooner if needed  Each maintenance medication was reviewed in detail including emphasizing most importantly the difference between maintenance and prns and under what circumstances the prns are to be triggered using an action plan format where appropriate.  Total time for H and P, chart review, counseling, reviewing hfa device(s) and generating customized AVS unique to this office visit / same day charting = 34 min summary f/u ov

## 2023-06-02 NOTE — Assessment & Plan Note (Addendum)
 10/21/22 sinus CT sinusitis - Teoh surgery 01/19/23 surgical center  with persistent  globus ever since surgery  - Refer to [facility] 06/01/2023 >>>   Of the three most common causes of  Sub-acute / recurrent or chronic cough, only one (GERD)  can actually contribute to/ trigger  the other two (asthma and post nasal drip syndrome)  and perpetuate the cylce of cough.  While not intuitively obvious, many patients with chronic low grade reflux do not cough until there is a primary insult that disturbs the protective epithelial barrier and exposes sensitive nerve endings.   This is typically viral but can due to PNDS and  either may apply here.   The point is that once this occurs, it is difficult to eliminate the cycle  using anything but a maximally effective acid suppression regimen at least in the short run, accompanied by an appropriate diet to address non acid GERD and control / eliminate the cough itself  initially with hard rock candies that do not include mint menthol or chocolate and f/u with ENT / Dr Brent Cambric at [facility] as wants a second opinion re her globus sensation. Aaron Aas

## 2023-06-08 ENCOUNTER — Ambulatory Visit (HOSPITAL_BASED_OUTPATIENT_CLINIC_OR_DEPARTMENT_OTHER): Payer: Managed Care, Other (non HMO) | Admitting: Obstetrics & Gynecology

## 2023-06-08 ENCOUNTER — Encounter (HOSPITAL_BASED_OUTPATIENT_CLINIC_OR_DEPARTMENT_OTHER): Payer: Self-pay | Admitting: Obstetrics & Gynecology

## 2023-06-08 ENCOUNTER — Other Ambulatory Visit (HOSPITAL_COMMUNITY)
Admission: RE | Admit: 2023-06-08 | Discharge: 2023-06-08 | Disposition: A | Discharge status: Home / Self Care | Source: Ambulatory Visit | Attending: Obstetrics & Gynecology | Admitting: Obstetrics & Gynecology

## 2023-06-08 VITALS — BP 113/69 | HR 70 | Ht 64.0 in | Wt 133.2 lb

## 2023-06-08 DIAGNOSIS — Z780 Asymptomatic menopausal state: Secondary | ICD-10-CM

## 2023-06-08 DIAGNOSIS — D270 Benign neoplasm of right ovary: Secondary | ICD-10-CM

## 2023-06-08 DIAGNOSIS — N390 Urinary tract infection, site not specified: Secondary | ICD-10-CM | POA: Diagnosis not present

## 2023-06-08 DIAGNOSIS — Z01419 Encounter for gynecological examination (general) (routine) without abnormal findings: Secondary | ICD-10-CM

## 2023-06-08 DIAGNOSIS — Z124 Encounter for screening for malignant neoplasm of cervix: Secondary | ICD-10-CM | POA: Insufficient documentation

## 2023-06-08 DIAGNOSIS — Z01411 Encounter for gynecological examination (general) (routine) with abnormal findings: Secondary | ICD-10-CM

## 2023-06-08 NOTE — Progress Notes (Signed)
 ANNUAL EXAM Patient name: Kimberly Stevens MRN [id number]  Date of birth: [birth year] Chief Complaint:   Annual Exam  History of Present Illness:   Kimberly Stevens is a 58 y.o. G59P2002 Caucasian female being seen today for a routine annual exam.  Doing well.  Have been on good trips recently.  Son-in-law just finished at [employer].  They are going to [facility] [place].    Denies vaginal bleeding.    Had some breast pain issues and was seen in march.  She had a diagnostic mammogram.  It is better.  She does still feel   Chronic cough is much, much better.  Has made several dietary changes.  Seeing Dr. Waymond Hailey.  Going to be referred to ENT to have an endoscopy.    Has appt with urogyn.  Has done PT.     Patient's last menstrual period was 06/20/2017 (approximate).   Last pap  10/09/2019. Results were: NILM w/ HRHPV negative. Last mammogram: 09/14/2022 . Results were: normal. Family h/o breast cancer: no Last colonoscopy: 05/23/2020 . Follow up 7 years. Family h/o colorectal cancer: no     06/08/2023    1:17 PM 05/04/2022    1:36 PM 12/24/2021   10:21 AM 08/05/2021   12:57 PM 09/09/2020    3:43 PM  Depression screen PHQ 2/9  Decreased Interest 0 0 0 0 0  Down, Depressed, Hopeless 0 0 0 0 0  PHQ - 2 Score 0 0 0 0 0         No data to display           Review of Systems:   Pertinent items are noted in HPI Denies any headaches, blurred vision, fatigue, shortness of breath, chest pain, abdominal pain, abnormal vaginal discharge/itching/odor/irritation, problems with periods, bowel movements, urination, or intercourse unless otherwise stated above. Pertinent History Reviewed:  Reviewed past medical,surgical, social and family history.  Reviewed problem list, medications and allergies. Physical Assessment:   Vitals:   06/08/23 1309  BP: 113/69  Pulse: 70  Weight: 133 lb 3.2 oz (60.4 kg)  Height: 5\' 4"  (1.626 m)  Body mass index is 22.86 kg/m.        Physical  Examination:   General appearance - well appearing, and in no distress  Mental status - alert, oriented to person, place, and time  Psych:  She has a normal mood and affect  Skin - warm and dry, normal color, no suspicious lesions noted  Chest - effort normal, all lung fields clear to auscultation bilaterally  Heart - normal rate and regular rhythm  Neck:  midline trachea, no thyromegaly or nodules  Breasts - breasts appear normal, no suspicious masses, no skin or nipple changes or  axillary nodes  Abdomen - soft, nontender, nondistended, no masses or organomegaly  Pelvic - VULVA: normal appearing vulva with no masses, tenderness or lesions   VAGINA: normal appearing vagina with normal color and discharge, no lesions   CERVIX: normal appearing cervix without discharge or lesions, no CMT  Thin prep pap is obtained today with HR HPV  UTERUS: uterus is felt to be normal size, shape, consistency and nontender   ADNEXA: No adnexal masses or tenderness noted.  Rectal - normal rectal, good sphincter tone, no masses felt.    Extremities:  No swelling or varicosities noted  Chaperone present for exam  Assessment & Plan:  1. Well woman exam with routine gynecological exam (Primary) - Pap smear obtained today - Mammogram  10/2022 - Colonoscopy 2022.  Follow up 7 years. - Bone mineral density plan after age 28. - lab work done with PCP, Dr. Arlene Ben - vaccines reviewed/updated  2. Postmenopausal - on on HRT  3. Postcoital UTI - does not need nitrofurantoin  rx  4. Dermoid cyst of right ovary - US  PELVIS TRANSVAGINAL NON-OB (TV ONLY); Future   Orders Placed This Encounter  Procedures   US  PELVIS TRANSVAGINAL NON-OB (TV ONLY)    Meds: No orders of the defined types were placed in this encounter.   Follow-up: Return in about 1 year (around 06/07/2024).  Lillian Rein, MD 06/08/2023 2:10 PM

## 2023-06-11 ENCOUNTER — Ambulatory Visit: Admitting: Obstetrics and Gynecology

## 2023-06-11 LAB — CYTOLOGY - PAP
Comment: NEGATIVE
Diagnosis: NEGATIVE
High risk HPV: NEGATIVE

## 2023-06-16 ENCOUNTER — Ambulatory Visit (HOSPITAL_BASED_OUTPATIENT_CLINIC_OR_DEPARTMENT_OTHER): Payer: Self-pay | Admitting: Obstetrics & Gynecology

## 2023-06-16 ENCOUNTER — Other Ambulatory Visit (HOSPITAL_BASED_OUTPATIENT_CLINIC_OR_DEPARTMENT_OTHER)

## 2023-06-16 ENCOUNTER — Ambulatory Visit (HOSPITAL_BASED_OUTPATIENT_CLINIC_OR_DEPARTMENT_OTHER): Admitting: Obstetrics & Gynecology

## 2023-06-16 VITALS — BP 116/76 | HR 60 | Ht 64.0 in | Wt 132.0 lb

## 2023-06-16 DIAGNOSIS — D270 Benign neoplasm of right ovary: Secondary | ICD-10-CM

## 2023-06-16 DIAGNOSIS — N83291 Other ovarian cyst, right side: Secondary | ICD-10-CM | POA: Diagnosis not present

## 2023-06-16 NOTE — Progress Notes (Unsigned)
 New Patient Evaluation and Consultation  Referring Provider: Lillian Rein, MD PCP: Almira Jaeger, MD Date of Service: 06/17/2023  SUBJECTIVE Chief Complaint: No chief complaint on file.  History of Present Illness: Kimberly Stevens is a 58 y.o. White or Caucasian female seen in consultation at the request of Dr Annabell Key for evaluation of stress urinary incontinence.    Considering surgical intervention for urinary leakage Tried self directed pelvic floor exercises and pelvic floor PT  ***Review of records significant for: ***IBS, postcoital UTI, h/o piriformis syndrome,   Urinary Symptoms: Leaks urine with cough/ sneeze, laughing, exercise, and with a full bladder Leaks *** time(s) per {days/wks/mos/yrs:310907}.  Pad use: {NUMBERS 1-10:18281} liners/ mini-pads per day.   Patient is bothered by UI symptoms.  Day time voids 5.  Nocturia: 1-2 times per night to void. Voiding dysfunction:  empties bladder well.  Patient does not use a catheter to empty bladder.  When urinating, patient feels {urine symptoms:24756} Drinks: ***oz water per day  UTIs: 1 UTI's in the last year.   {ACTIONS;DENIES/REPORTS:21021675::"Denies"} history of blood in urine, kidney or bladder stones, pyelonephritis, bladder cancer, and kidney cancer No results found for the last 90 days.   Pelvic Organ Prolapse Symptoms:                  Patient Denies a feeling of a bulge the vaginal area.   Bowel Symptom: Bowel movements: 2-3 time(s) per day Stool consistency: soft  Straining: yes.  Splinting: yes.  Incomplete evacuation: yes.  Patient Denies accidental bowel leakage / fecal incontinence Bowel regimen: {bowel regimen:24759} Last colonoscopy: Results *** [facility] Colonoscopy          Upcoming     Colonoscopy (Every 7 Years) Next due on 05/24/2027    05/23/2020  COLONOSCOPY   Only the first 1 history entries have been loaded, but more history exists.                Sexual  Function Sexually active: yes.  Sexual orientation: Straight Pain with sex: No  Pelvic Pain Denies pelvic pain  Past Medical History:  Past Medical History:  Diagnosis Date   Allergy    seasonal allergies   Arthritis    left great toe joint   Broken arm    right elbow & wrist   Colon polyps    benign   Depression    hx of post partum   Dermoid cyst of ovary    Eye disorder    possible glaucoma-being followed by Dr Kennyth Pean   Fibrocystic breast    IBS (irritable bowel syndrome)    Plantar fasciitis    left foot 2/15-11/15   Polyp of duodenum 2014   tiny   Tendonitis    right knee 2/15-11/15   Torn rotator cuff    right     Past Surgical History:   Past Surgical History:  Procedure Laterality Date   BROW LIFT Bilateral 02/22/2020   Procedure: BLEPHAROPLASTY UPPER EYELID; W/EXCESS SKIN BILATERAL;  Surgeon: Zacarias Hermann, MD;  Location: [facility];  Service: Ophthalmology;  Laterality: Bilateral;   COLONOSCOPY  2014   JP-MAC-movi(exc)-poylpoid   NASAL SINUS SURGERY  01/19/2023   POLYPECTOMY  2014   polypoid   WISDOM TOOTH EXTRACTION       Past OB/GYN History: OB History  Gravida Para Term Preterm AB Living  2 2 2  0 0 2  SAB IAB Ectopic Multiple Live Births  0 0 0 0 2    #  Outcome Date GA Lbr Len/2nd Weight Sex Type Anes PTL Lv  2 Term     F Vag-Spont   LIV  1 Term     M Vag-Spont   LIV    Vaginal deliveries: 2,  Forceps/ Vacuum deliveries: ***, Cesarean section: 0 Menopausal: Yes, at age 57, Denies vaginal bleeding since menopause Contraception: s/p menopause. Last pap smear.  Any history of abnormal pap smears: no.    Component Value Date/Time   DIAGPAP  06/08/2023 1408    - Negative for intraepithelial lesion or malignancy (NILM)   DIAGPAP  10/09/2019 1411    - Negative for intraepithelial lesion or malignancy (NILM)   HPVHIGH Negative 06/08/2023 1408   HPVHIGH Negative 10/09/2019 1411   ADEQPAP  06/08/2023 1408    Satisfactory for  evaluation; transformation zone component PRESENT.   ADEQPAP  10/09/2019 1411    Satisfactory for evaluation; transformation zone component PRESENT.    Medications: Patient has a current medication list which includes the following prescription(s): airsupra , famotidine, fexofenadine, fluticasone , multiple vitamin, nitrofurantoin , pantoprazole , and cholecalciferol.   Allergies: Patient is allergic to gluten meal and lactose intolerance (gi).   Social History:  Social History   Tobacco Use   Smoking status: Never   Smokeless tobacco: Never  Vaping Use   Vaping status: Never Used  Substance Use Topics   Alcohol use: Yes    Alcohol/week: 4.0 standard drinks of alcohol    Types: 4 Standard drinks or equivalent per week    Comment: 4 per week   Drug use: No    Relationship status: married Patient lives with her husband.   Patient is not employed. Regular exercise: Yes: *** History of abuse: No  Family History:   Family History  Problem Relation Age of Onset   Arthritis Paternal Grandmother    Arthritis Maternal Grandmother    Heart attack Maternal Grandmother        age 4   Multiple myeloma Father        54 in 2022. around mid 60s.   Diverticulitis Father    Hearing loss Father        and poor vision   Hepatitis Father        B she believes   Macular degeneration Father    Leukemia Father    Conductive hearing loss Father    Arthritis Mother        71 in 2022. shoulder, knee surgery.   Healthy Sister    Healthy Sister    Leukemia Maternal Uncle    Colon cancer Neg Hx    Colon polyps Neg Hx    Liver disease Neg Hx    Pancreatic cancer Neg Hx    Esophageal cancer Neg Hx    Stomach cancer Neg Hx    Rectal cancer Neg Hx      Review of Systems: Review of Systems  Constitutional:  Negative for fever, malaise/fatigue and weight loss.  Respiratory:  Negative for cough, shortness of breath and wheezing.   Cardiovascular:  Negative for chest pain, palpitations and  leg swelling.  Gastrointestinal:  Negative for abdominal pain and blood in stool.  Genitourinary:  Positive for frequency (night time). Negative for dysuria, hematuria and urgency.       Leakage  Skin:  Negative for rash.  Neurological:  Negative for dizziness, weakness and headaches.  Endo/Heme/Allergies:  Does not bruise/bleed easily.  Psychiatric/Behavioral:  Negative for depression. The patient is not nervous/anxious.      OBJECTIVE Physical  Exam: There were no vitals filed for this visit.  Physical Exam Constitutional:      General: She is not in acute distress.    Appearance: Normal appearance.  Genitourinary:     Bladder and urethral meatus normal.     No lesions in the vagina.     Right Labia: No rash, tenderness, lesions, skin changes or Bartholin's cyst.    Left Labia: No tenderness, lesions, skin changes, Bartholin's cyst or rash.    No vaginal discharge, erythema, tenderness, bleeding, ulceration or granulation tissue.      Right Adnexa: not tender, not full and no mass present.    Left Adnexa: not tender, not full and no mass present.    No cervical motion tenderness, discharge, friability, lesion, polyp or nabothian cyst.     Uterus is not enlarged, fixed, tender or irregular.     No uterine mass detected.    Urethral meatus caruncle not present.    No urethral prolapse, tenderness, mass, hypermobility or discharge present.     Bladder is not tender, urgency on palpation not present and masses not present.      Levator ani not tender, obturator internus not tender, no asymmetrical contractions present and no pelvic spasms present.    Anal wink present and BC reflex present. Cardiovascular:     Rate and Rhythm: Normal rate.  Pulmonary:     Effort: Pulmonary effort is normal. No respiratory distress.  Abdominal:     General: There is no distension.     Palpations: Abdomen is soft. There is no mass.     Tenderness: There is no abdominal tenderness.     Hernia:  No hernia is present.  Neurological:     Mental Status: She is alert.  Vitals reviewed. Exam conducted with a chaperone present.      POP-Q:   POP-Q                                               Aa                                               Ba                                                 C                                                Gh                                               Pb  tvl                                                Ap                                               Bp                                                 D      Rectal Exam:  Normal sphincter tone, {rectocele:24766} distal rectocele, enterocoele {DESC; PRESENT/NOT PRESENT:21021351}, no rectal masses, {sign of:24767} dyssynergia when asking the patient to bear down.  Post-Void Residual (PVR) by Bladder Scan: In order to evaluate bladder emptying, we discussed obtaining a postvoid residual and patient agreed to this procedure.  Procedure: The ultrasound unit was placed on the patient's abdomen in the suprapubic region after the patient had voided.      Laboratory Results: Lab Results  Component Value Date   COLORU yellow 04/08/2017   CLARITYU clear 04/08/2017   GLUCOSEUR n 04/08/2017   BILIRUBINUR n 04/08/2017   KETONESU n 04/08/2017   RBCUR n 04/08/2017   PHUR 5.0 04/08/2017   PROTEINUR n 04/08/2017   UROBILINOGEN negative (A) 04/08/2017   LEUKOCYTESUR Negative 04/08/2017    Lab Results  Component Value Date   CREATININE 0.81 04/20/2023   CREATININE 0.96 07/22/2022   CREATININE 0.93 10/01/2021    No results found for: "HGBA1C"  Lab Results  Component Value Date   HGB 15.4 (H) 04/20/2023     ASSESSMENT AND PLAN Kimberly Stevens is a 58 y.o. with: No diagnosis found.  There are no diagnoses linked to this encounter.   Darlene Ehlers, MD

## 2023-06-17 ENCOUNTER — Encounter: Payer: Self-pay | Admitting: Obstetrics

## 2023-06-17 ENCOUNTER — Ambulatory Visit (INDEPENDENT_AMBULATORY_CARE_PROVIDER_SITE_OTHER): Admitting: Obstetrics

## 2023-06-17 ENCOUNTER — Other Ambulatory Visit (HOSPITAL_COMMUNITY)
Admission: RE | Admit: 2023-06-17 | Discharge: 2023-06-17 | Disposition: A | Discharge status: Home / Self Care | Source: Ambulatory Visit | Attending: Obstetrics | Admitting: Obstetrics

## 2023-06-17 ENCOUNTER — Ambulatory Visit: Payer: Self-pay | Admitting: Obstetrics

## 2023-06-17 VITALS — BP 106/73 | HR 76 | Ht 63.98 in | Wt 133.6 lb

## 2023-06-17 DIAGNOSIS — K5900 Constipation, unspecified: Secondary | ICD-10-CM | POA: Diagnosis not present

## 2023-06-17 DIAGNOSIS — N393 Stress incontinence (female) (male): Secondary | ICD-10-CM | POA: Diagnosis not present

## 2023-06-17 DIAGNOSIS — R8290 Unspecified abnormal findings in urine: Secondary | ICD-10-CM

## 2023-06-17 DIAGNOSIS — N952 Postmenopausal atrophic vaginitis: Secondary | ICD-10-CM

## 2023-06-17 LAB — POCT URINALYSIS DIP (CLINITEK)
Bilirubin, UA: NEGATIVE
Glucose, UA: NEGATIVE mg/dL
Ketones, POC UA: NEGATIVE mg/dL
Nitrite, UA: NEGATIVE
POC PROTEIN,UA: NEGATIVE
Spec Grav, UA: 1.015 (ref 1.010–1.025)
Urobilinogen, UA: 0.2 U/dL
pH, UA: 5.5 (ref 5.0–8.0)

## 2023-06-17 LAB — URINALYSIS, ROUTINE W REFLEX MICROSCOPIC
Bilirubin Urine: NEGATIVE
Glucose, UA: NEGATIVE mg/dL
Hgb urine dipstick: NEGATIVE
Ketones, ur: NEGATIVE mg/dL
Nitrite: NEGATIVE
Protein, ur: NEGATIVE mg/dL
Specific Gravity, Urine: 1.017 (ref 1.005–1.030)
pH: 6 (ref 5.0–8.0)

## 2023-06-17 MED ORDER — ESTRADIOL 0.1 MG/GM VA CREA: 0.5000 g | TOPICAL_CREAM | VAGINAL | 3 refills | Status: AC

## 2023-06-17 NOTE — Assessment & Plan Note (Signed)
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  We also discussed the importance of avoiding chronic straining, as it can exacerbate her pelvic floor symptoms; we discussed treating constipation and straining prior to surgery, as postoperative straining can lead to damage to the repair and recurrence of symptoms. We discussed initiating therapy with increasing fluid intake, fiber supplementation, stool softeners, and laxatives such as miralax.  - titrate fiber supplementation to improve sensation of incomplete emptying - encouraged squatting position for defecation

## 2023-06-17 NOTE — Assessment & Plan Note (Signed)
-   POCT UA + leuk/heme - pending UA microscopy - start low dose vaginal estrogen

## 2023-06-17 NOTE — Assessment & Plan Note (Signed)
-   uses lubrication during intercourse - For symptomatic vaginal atrophy options include lubrication with a water-based lubricant, personal hygiene measures and barrier protection against wetness, and estrogen replacement in the form of vaginal cream, vaginal tablets, or a time-released vaginal ring.   - Rx low dose vaginal estrogen

## 2023-06-17 NOTE — Assessment & Plan Note (Addendum)
-   active lifestyle and desires to resume running, s/p pelvic floor PT - For treatment of stress urinary incontinence,  non-surgical options include expectant management, weight loss, physical therapy, as well as a pessary.  Surgical options include a midurethral sling, Burch urethropexy, and transurethral injection of a bulking agent. - discussed an office procedure with urethral bulking (Bulkamid). We discussed success rate of approximately 70-80% and possible need for second injection. We reviewed that this is not a permanent procedure and the Bulkamid does dissolve over time. Risks reviewed including injury to bladder or urethra, UTI, urinary retention and hematuria.  - Sling: The effectiveness of a midurethral vaginal mesh sling is approximately 85%, and thus, there will be times when you may leak urine after surgery, especially if your bladder is full or if you have a strong cough. There is a balance between making the sling tight enough to treat your leakage but not too tight so that you have long-term difficulty emptying your bladder. A mesh sling will not directly treat overactive bladder/urge incontinence and may worsen it.  There is an FDA safety notification on vaginal mesh procedures for prolapse but NOT mesh slings. We have extensive experience and training with mesh placement and we have close postoperative follow up to identify any potential complications from mesh. It is important to realize that this mesh is a permanent implant that cannot be easily removed. There are rare risks of mesh exposure (2-4%), pain with intercourse (0-7%), and infection (<1%). The risk of mesh exposure if more likely in a woman with risks for poor healing (prior radiation, poorly controlled diabetes, or immunocompromised). The risk of new or worsened chronic pain after mesh implant is more common in women with baseline chronic pain and/or poorly controlled anxiety or depression. Approximately 2-4% of patients will  experience longer-term post-operative voiding dysfunction that may require surgical revision of the sling. We also reviewed that postoperatively, her stream may not be as strong as before surgery.  - trial of low dose vaginal estrogen with Rx sent - continue Kegel exercises - if she continues to have symptoms, will consider anti-incontinence procedure at the time of intervention for Dermoid cyst

## 2023-06-17 NOTE — Progress Notes (Deleted)
 Underwent pelvic floor PT x 6 weeks without relief.  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

## 2023-06-17 NOTE — Patient Instructions (Addendum)
 For treatment of stress urinary incontinence, which is leakage with physical activity/movement/strainging/coughing, we discussed expectant management versus nonsurgical options versus surgery. Nonsurgical options include weight loss, physical therapy, as well as a pessary.  Surgical options include a midurethral sling, which is a synthetic mesh sling that acts like a hammock under the urethra to prevent leakage of urine, a Burch urethropexy, and transurethral injection of a bulking agent.   We discussed an office procedure with urethral bulking (Bulkamid). We discussed success rate of approximately 70-80% and possible need for second injection. We reviewed that this is not a permanent procedure and the Bulkamid does dissolve over time. Risks reviewed including injury to bladder or urethra, UTI, urinary retention and hematuria.   Sling: The effectiveness of a midurethral vaginal mesh sling is approximately 85%, and thus, there will be times when you may leak urine after surgery, especially if your bladder is full or if you have a strong cough. There is a balance between making the sling tight enough to treat your leakage but not too tight so that you have long-term difficulty emptying your bladder. A mesh sling will not directly treat overactive bladder/urge incontinence and may worsen it.  There is an FDA safety notification on vaginal mesh procedures for prolapse but NOT mesh slings. We have extensive experience and training with mesh placement and we have close postoperative follow up to identify any potential complications from mesh. It is important to realize that this mesh is a permanent implant that cannot be easily removed. There are rare risks of mesh exposure (2-4%), pain with intercourse (0-7%), and infection (<1%). The risk of mesh exposure if more likely in a woman with risks for poor healing (prior radiation, poorly controlled diabetes, or immunocompromised). The risk of new or worsened chronic  pain after mesh implant is more common in women with baseline chronic pain and/or poorly controlled anxiety or depression. Approximately 2-4% of patients will experience longer-term post-operative voiding dysfunction that may require surgical revision of the sling. We also reviewed that postoperatively, her stream may not be as strong as before surgery.   Constipation: Our goal is to achieve formed bowel movements daily or every-other-day.  You may need to try different combinations of the following options to find what works best for you - everybody's body works differently so feel free to adjust the dosages as needed.  Some options to help maintain bowel health include:  Dietary changes (more leafy greens, vegetables and fruits; less processed foods) Fiber supplementation (Benefiber, FiberCon, Metamucil or Psyllium). Start slow and increase gradually to full dose. Over-the-counter agents such as: stool softeners (Docusate or Colace) and/or laxatives (Miralax, milk of magnesia)  "Power Pudding" is a natural mixture that may help your constipation.  To make blend 1 cup applesauce, 1 cup wheat bran, and 3/4 cup prune juice, refrigerate and then take 1 tablespoon daily with a large glass of water as needed.   Women should try to eat at least 21 to 25 grams of fiber a day, while men should aim for 30 to 38 grams a day. You can add fiber to your diet with food or a fiber supplement such as psyllium (metamucil), benefiber, or fibercon.   Here's a look at how much dietary fiber is found in some common foods. When buying packaged foods, check the Nutrition Facts label for fiber content. It can vary among brands.  Fruits Serving size Total fiber (grams)*  Raspberries 1 cup 8.0  Pear 1 medium 5.5  Apple, with  skin 1 medium 4.5  Banana 1 medium 3.0  Orange 1 medium 3.0  Strawberries 1 cup 3.0   Vegetables Serving size Total fiber (grams)*  Green peas, boiled 1 cup 9.0  Broccoli, boiled 1 cup chopped  5.0  Turnip greens, boiled 1 cup 5.0  Brussels sprouts, boiled 1 cup 4.0  Potato, with skin, baked 1 medium 4.0  Sweet corn, boiled 1 cup 3.5  Cauliflower, raw 1 cup chopped 2.0  Carrot, raw 1 medium 1.5   Grains Serving size Total fiber (grams)*  Spaghetti, whole-wheat, cooked 1 cup 6.0  Barley, pearled, cooked 1 cup 6.0  Bran flakes 3/4 cup 5.5  Quinoa, cooked 1 cup 5.0  Oat bran muffin 1 medium 5.0  Oatmeal, instant, cooked 1 cup 5.0  Popcorn, air-popped 3 cups 3.5  Brown rice, cooked 1 cup 3.5  Bread, whole-wheat 1 slice 2.0  Bread, rye 1 slice 2.0   Legumes, nuts and seeds Serving size Total fiber (grams)*  Split peas, boiled 1 cup 16.0  Lentils, boiled 1 cup 15.5  Black beans, boiled 1 cup 15.0  Baked beans, canned 1 cup 10.0  Chia seeds 1 ounce 10.0  Almonds 1 ounce (23 nuts) 3.5  Pistachios 1 ounce (49 nuts) 3.0  Sunflower kernels 1 ounce 3.0  *Rounded to nearest 0.5 gram. Source: [employer] for [employer]	[place]

## 2023-06-18 LAB — URINE CULTURE: Culture: <10000 — AB

## 2023-06-19 ENCOUNTER — Encounter (HOSPITAL_BASED_OUTPATIENT_CLINIC_OR_DEPARTMENT_OTHER): Payer: Self-pay | Admitting: Obstetrics & Gynecology

## 2023-06-19 NOTE — Progress Notes (Signed)
 GYNECOLOGY  VISIT  CC:   Discuss ultrasound results, h/o ovarian dermoid  HPI: 58 y.o. G45P2002 Married White or Caucasian female here for discussion of ultrasound results.  Ultrasound obtained to recheck ovarian dermoid that has been present on the right ovary.  This was followed by Dr. Jinger Mount and never really changed.  Last ultrasound done 10/23/2020.  Here for repeat today.    Uterus ~5 x 4 x 2.5cm.  Endometrium 1.36mm.  Right ovary 2.3 x 1.6 x 2.7cm with dermoid encompassing most of the ovary.  Left ovary is 1.2 x 1.5 x 1.7cm and is normal in appearance.  Cervix normal.  No free fluid present.    Discussed monitoring vs removal.  She is seeing Dr. Aron Lard with urogynecology and she is considering treatment for SUI.  Asks about having ovary removal done at same time.  This feels very reasonable to me.  Discussed laparoscopic procedure and RSO with left salpingectomy and possibly BSO.  Ovarian function with relation to heart health and bone health discussed.  She is not sure what she will want.  Will discuss with Dr. Aron Lard after her consult and evaluation for surgical planning.   Past Medical History:  Diagnosis Date   Allergy    seasonal allergies   Arthritis    left great toe joint   Broken arm    right elbow & wrist   Colon polyps    benign   Depression    hx of post partum   Dermoid cyst of ovary    Eye disorder    possible glaucoma-being followed by Dr Kennyth Pean   Fibrocystic breast    IBS (irritable bowel syndrome)    Plantar fasciitis    left foot 2/15-11/15   Polyp of duodenum 2014   tiny   Tendonitis    right knee 2/15-11/15   Torn rotator cuff    right    MEDS:   Current Outpatient Medications on File Prior to Visit  Medication Sig Dispense Refill   Albuterol -Budesonide (AIRSUPRA ) 90-80 MCG/ACT AERO Inhale 2 puffs into the lungs every 4 (four) hours as needed. 10.7 g 0   famotidine (PEPCID) 20 MG tablet Take 20 mg by mouth 2 (two) times daily.     fexofenadine (ALLEGRA)  180 MG tablet Take 180 mg by mouth daily.     fluticasone  (FLONASE ) 50 MCG/ACT nasal spray Place 2 sprays into both nostrils daily. 16 g 10   Multiple Vitamin (MULTIVITAMIN ADULT PO) Take by mouth.     nitrofurantoin  (MACRODANTIN ) 100 MG capsule Takes 1 tablet after intercourse 30 capsule 0   pantoprazole  (PROTONIX ) 40 MG tablet Take 1 tablet (40 mg total) by mouth daily. Take 30-60 min before first meal of the day 30 tablet 2   VITAMIN D , CHOLECALCIFEROL, PO Take by mouth.     No current facility-administered medications on file prior to visit.    ALLERGIES: Gluten meal and Lactose intolerance (gi)  SH:  married, non smoker  Review of Systems  Constitutional: Negative.   Genitourinary: Negative.     PHYSICAL EXAMINATION:    BP 116/76 (BP Location: Left Arm, Patient Position: Sitting, Cuff Size: Normal)   Pulse 60   Ht 5\' 4"  (1.626 m)   Wt 132 lb (59.9 kg)   LMP 06/20/2017 (Approximate)   BMI 22.66 kg/m      Physical Exam Constitutional:      Appearance: Normal appearance.  Neurological:     General: No focal deficit present.  Psychiatric:  Mood and Affect: Mood normal.    Assessment/Plan: 1. Dermoid cyst of right ovary (Primary) - pt considering surgical removal at same time as surgical treatment for SUI.  Seeing Dr. Aron Lard and will discuss with her after consult and evaluation is completed.

## 2023-07-19 ENCOUNTER — Telehealth (HOSPITAL_BASED_OUTPATIENT_CLINIC_OR_DEPARTMENT_OTHER): Payer: Self-pay | Admitting: Obstetrics & Gynecology

## 2023-07-19 ENCOUNTER — Other Ambulatory Visit: Payer: Self-pay | Admitting: Obstetrics

## 2023-07-19 DIAGNOSIS — N393 Stress incontinence (female) (male): Secondary | ICD-10-CM

## 2023-07-19 NOTE — Progress Notes (Signed)
 Patient called office regarding decision to proceed with Midurethral sling and anterior repair.  Desires hysterectomy and dermoid cystectomy at the time of surgery. Advised pt to contact Dr. Dianne office for preop discussion and order for surgery placed.

## 2023-07-19 NOTE — Telephone Encounter (Signed)
 New message   The patient called and has decided to proceed with the hysterectomy, looking for late September to early October asking for a call back to discuss

## 2023-07-27 NOTE — Telephone Encounter (Signed)
 Called pt.  She has recently seen Dr. Guadlupe, urogynecology, and has decided to proceed with surgical treatment of her incontinence.  She desires ovary removal at the same time.  Message said she was ready to proceed with hysterectomy but confirmed with her today that this is not what she meant.  She agrees that she only wants ovary removal completed.  At this time she is favoring removal of both ovaries.  Will communicate with Dr. Guadlupe to proceed with scheduling.  Pt understands Dr. Evy office will call with details.  She desires late September/early October time frame.

## 2023-07-29 ENCOUNTER — Ambulatory Visit: Admitting: Internal Medicine

## 2023-07-31 ENCOUNTER — Other Ambulatory Visit: Payer: Self-pay | Admitting: Internal Medicine

## 2023-08-02 ENCOUNTER — Other Ambulatory Visit: Payer: Self-pay | Admitting: Obstetrics & Gynecology

## 2023-08-02 DIAGNOSIS — Z1231 Encounter for screening mammogram for malignant neoplasm of breast: Secondary | ICD-10-CM

## 2023-08-23 ENCOUNTER — Other Ambulatory Visit: Payer: Self-pay | Admitting: Obstetrics & Gynecology

## 2023-08-23 ENCOUNTER — Ambulatory Visit (HOSPITAL_BASED_OUTPATIENT_CLINIC_OR_DEPARTMENT_OTHER)

## 2023-08-23 ENCOUNTER — Encounter (HOSPITAL_BASED_OUTPATIENT_CLINIC_OR_DEPARTMENT_OTHER): Payer: Self-pay

## 2023-08-23 VITALS — BP 124/85 | HR 59 | Ht 64.0 in | Wt 137.6 lb

## 2023-08-23 DIAGNOSIS — R300 Dysuria: Secondary | ICD-10-CM | POA: Diagnosis not present

## 2023-08-23 LAB — POCT URINALYSIS DIPSTICK
Bilirubin, UA: NEGATIVE
Glucose, UA: NEGATIVE
Ketones, UA: NEGATIVE
Nitrite, UA: POSITIVE
Protein, UA: NEGATIVE
Spec Grav, UA: 1.01 (ref 1.010–1.025)
Urobilinogen, UA: 0.2 U/dL
pH, UA: 7 (ref 5.0–8.0)

## 2023-08-23 NOTE — Progress Notes (Signed)
 NURSE VISIT- UTI SYMPTOMS   SUBJECTIVE:  Kimberly Stevens is a 58 y.o. G26P2002 female here for UTI symptoms. She is a GYN patient. She reports dysuria.  OBJECTIVE:  BP 124/85   Pulse (!) 59   Ht 5' 4 (1.626 m) Comment: Reported  Wt 137 lb 9.6 oz (62.4 kg)   LMP 06/20/2017 (Approximate)   BMI 23.62 kg/m   Appears well, in no apparent distress  No results found for this or any previous visit (from the past 24 hours).  ASSESSMENT: GYN patient with UTI symptoms and positive nitrites  PLAN: Visit routed to or discussed with:  Rx sent today: No Urine culture sent Call or return to clinic prn if these symptoms worsen or fail to improve as anticipated. Follow-up: as needed

## 2023-08-24 ENCOUNTER — Other Ambulatory Visit (HOSPITAL_BASED_OUTPATIENT_CLINIC_OR_DEPARTMENT_OTHER): Payer: Self-pay | Admitting: Obstetrics & Gynecology

## 2023-08-24 DIAGNOSIS — D270 Benign neoplasm of right ovary: Secondary | ICD-10-CM

## 2023-08-25 ENCOUNTER — Ambulatory Visit (HOSPITAL_BASED_OUTPATIENT_CLINIC_OR_DEPARTMENT_OTHER): Payer: Self-pay | Admitting: Obstetrics & Gynecology

## 2023-08-25 LAB — URINE CULTURE

## 2023-08-25 MED ORDER — NITROFURANTOIN MONOHYD MACRO 100 MG PO CAPS: 100.0000 mg | ORAL_CAPSULE | Freq: Two times a day (BID) | ORAL | 0 refills | Status: DC

## 2023-08-31 ENCOUNTER — Other Ambulatory Visit: Payer: Self-pay | Admitting: Internal Medicine

## 2023-08-31 MED ORDER — PANTOPRAZOLE SODIUM 40 MG PO TBEC: 40.0000 mg | DELAYED_RELEASE_TABLET | Freq: Every day | ORAL | 2 refills | Status: DC

## 2023-08-31 NOTE — Telephone Encounter (Signed)
 Copied from CRM [id number]. Topic: Clinical - Medication Refill >> Aug 31, 2023  8:28 AM Isabell A wrote: Medication: pantoprazole  (PROTONIX ) 40 MG tablet  Has the patient contacted their pharmacy? Yes (Agent: If no, request that the patient contact the pharmacy for the refill. If patient does not wish to contact the pharmacy document the reason why and proceed with request.) (Agent: If yes, when and what did the pharmacy advise?)  This is the patient's preferred pharmacy:  [id number] [place], [place] - [address] AT [facility] OF [facility] [address] & [facility] [address] [place] [address] Phone: [phone] Fax: [phone]  Is this the correct pharmacy for this prescription? Yes If no, delete pharmacy and type the correct one.   Has the prescription been filled recently? Yes  Is the patient out of the medication? Yes  Has the patient been seen for an appointment in the last year OR does the patient have an upcoming appointment? Yes  Can we respond through MyChart? No  Agent: Please be advised that Rx refills may take up to 3 business days. We ask that you follow-up with your pharmacy.

## 2023-09-03 ENCOUNTER — Encounter: Payer: Self-pay | Admitting: *Deleted

## 2023-09-16 ENCOUNTER — Ambulatory Visit
Admission: RE | Admit: 2023-09-16 | Discharge: 2023-09-16 | Disposition: A | Discharge status: Home / Self Care | Source: Ambulatory Visit | Attending: Obstetrics & Gynecology | Admitting: Obstetrics & Gynecology

## 2023-09-16 ENCOUNTER — Other Ambulatory Visit: Payer: Self-pay | Admitting: Obstetrics

## 2023-09-16 DIAGNOSIS — Z1231 Encounter for screening mammogram for malignant neoplasm of breast: Secondary | ICD-10-CM

## 2023-09-16 DIAGNOSIS — N8110 Cystocele, unspecified: Secondary | ICD-10-CM | POA: Insufficient documentation

## 2023-09-16 NOTE — Progress Notes (Signed)
 Patient stopped vaginal estrogen due to irritation during loading dose nightly.  Encouraged pt to consider resuming at 1g twice a week to assess change in SUI.  Discussed asymptomatic stage II pelvic organ prolapse. For treatment of pelvic organ prolapse, we discussed options for management including expectant management, conservative management, and surgical management, such as Kegels, a pessary, pelvic floor physical therapy, and specific surgical procedures. Pt desires to proceed with anterior repair and possible posterior repair Adding CPT 57260 for possible anterior/posterior repair for PA.

## 2023-09-16 NOTE — Progress Notes (Deleted)
 [facility] Return Visit  SUBJECTIVE  History of Present Illness: Kimberly Stevens is a 58 y.o. female seen in follow-up for  stress urinary incontinence, constipation, vaginal atrophy, and abnormal urinalysis. Plan at last visit was trial of vaginal estrogen.   Pending laparoscopic BSO by Dr. Cleotilde on 11/09/23 and concomitant midurethral sling with cystoscopy  Past Medical History: Patient  has a past medical history of Allergy, Arthritis, Broken arm, Colon polyps, Depression, Dermoid cyst of ovary, Eye disorder, Fibrocystic breast, IBS (irritable bowel syndrome), Plantar fasciitis, Polyp of duodenum (2014), Tendonitis, and Torn rotator cuff.   Past Surgical History: She  has a past surgical history that includes Wisdom tooth extraction; Brow lift (Bilateral, 02/22/2020); Colonoscopy (2014); Polypectomy (2014); and Nasal sinus surgery (01/19/2023).   Medications: She has a current medication list which includes the following prescription(s): airsupra , estradiol , famotidine, fexofenadine, fluticasone , multiple vitamin, nitrofurantoin  (macrocrystal-monohydrate), pantoprazole , and cholecalciferol.   Allergies: Patient is allergic to gluten meal and lactose intolerance (gi).   Social History: Patient  reports that she has never smoked. She has never used smokeless tobacco. She reports current alcohol use of about 4.0 standard drinks of alcohol per week. She reports that she does not use drugs.     OBJECTIVE     Physical Exam: There were no vitals filed for this visit. Gen: No apparent distress, A&O x 3.  Detailed Urogynecologic Evaluation:  Deferred. Prior exam showed:      No data to display             ASSESSMENT AND PLAN    Kimberly Stevens is a 58 y.o. with:  No diagnosis found.  There are no diagnoses linked to this encounter.   Lianne ONEIDA Gillis, MD

## 2023-09-17 ENCOUNTER — Ambulatory Visit: Admitting: Obstetrics

## 2023-09-17 ENCOUNTER — Ambulatory Visit (INDEPENDENT_AMBULATORY_CARE_PROVIDER_SITE_OTHER): Payer: Managed Care, Other (non HMO) | Admitting: Otolaryngology

## 2023-09-17 ENCOUNTER — Encounter (INDEPENDENT_AMBULATORY_CARE_PROVIDER_SITE_OTHER): Payer: Self-pay | Admitting: Otolaryngology

## 2023-09-17 VITALS — BP 119/85 | HR 74

## 2023-09-17 DIAGNOSIS — J338 Other polyp of sinus: Secondary | ICD-10-CM | POA: Diagnosis not present

## 2023-09-17 DIAGNOSIS — J320 Chronic maxillary sinusitis: Secondary | ICD-10-CM | POA: Diagnosis not present

## 2023-09-17 DIAGNOSIS — J310 Chronic rhinitis: Secondary | ICD-10-CM | POA: Diagnosis not present

## 2023-09-17 DIAGNOSIS — R0981 Nasal congestion: Secondary | ICD-10-CM

## 2023-09-19 NOTE — Progress Notes (Signed)
 Patient ID: Kimberly Stevens, female   DOB: 06-03-1965, 58 y.o.   MRN: [id number]  Follow-up: Bilateral chronic maxillary sinusitis and polyposis, chronic nasal congestion  HPI: The patient is a 58 year old female who returns today for follow-up evaluation.  The patient has a history of chronic nasal congestion and bilateral chronic maxillary sinusitis and polyposis.  She underwent bilateral endoscopic sinus surgery, septoplasty, and bilateral turbinate reduction in December 2024.  The patient returns today reporting significant improvement in her nasal breathing.  She has noted occasional mild nasal drainage and cough.  She has not had any recent sinusitis.  Currently she denies any facial pain, fever, or visual change.  Exam: General: Communicates without difficulty, well nourished, no acute distress. Head: Normocephalic, no evidence injury, no tenderness, facial buttresses intact without stepoff. Face/sinus: No tenderness to palpation and percussion. Facial movement is normal and symmetric. Eyes: PERRL, EOMI. No scleral icterus, conjunctivae clear. Neuro: CN II exam reveals vision grossly intact.  No nystagmus at any point of gaze. Ears: Auricles well formed without lesions.  Ear canals are intact without mass or lesion.  No erythema or edema is appreciated.  The TMs are intact without fluid. Nose: External evaluation reveals normal support and skin without lesions.  Dorsum is intact.  Anterior rhinoscopy reveals mildly congested mucosa over anterior aspect of inferior turbinates and intact septum.  No purulence noted. Oral:  Oral cavity and oropharynx are intact, symmetric, without erythema or edema.  Mucosa is moist without lesions. Neck: Full range of motion without pain.  There is no significant lymphadenopathy.  No masses palpable.  Thyroid  bed within normal limits to palpation.  Parotid glands and submandibular glands equal bilaterally without mass.  Trachea is midline. Neuro:  CN 2-12 grossly  intact.   Assessment: 1.  Bilateral chronic maxillary sinusitis and polyposis.  The patient has been asymptomatic since her sinus surgery in December 2024.  No recurrent polyposis is noted today. 2.  Chronic rhinitis with mild nasal mucosal congestion.  Her septum and turbinates are well-healed.  Plan: 1.  The physical exam findings are reviewed with the patient. 2.  The patient is reassured that no recurrent infection or polyposis is noted today. 3.  Nasal saline irrigation as needed. 4.  The patient will return for reevaluation in 6 months, sooner if needed.

## 2023-09-22 ENCOUNTER — Other Ambulatory Visit: Payer: Self-pay | Admitting: Obstetrics & Gynecology

## 2023-09-22 DIAGNOSIS — R928 Other abnormal and inconclusive findings on diagnostic imaging of breast: Secondary | ICD-10-CM

## 2023-09-22 NOTE — Progress Notes (Unsigned)
 Kimberly Stevens, female    DOB: [birth year]   MRN: [id number]   Brief patient profile:  21 yowf never smoker no problem with resp prior to moving  to [place] 1999 with onset of rhinitis [place]  in 2013 with nasal congestion  rx astelin and singulair  much worse nasal symptoms even worse p blephoropalsty in Feb 2022 and cough ever  since so referred  10/19/2022 by Dr Katrinka  for cough with sensation of pnds    Allergy eval Meg Whelan pos mold    History of Present Illness  10/19/2022  Pulmonary/ 1st office eval/Eriona Kinchen  day #3 of augmentin   Chief Complaint  Patient presents with   Consult    Cough (2 years ) productive green. Pt is on a antibiotic, has tried an albuterol  inhaler. Did not find relief.    Dyspnea:  aerobic ex no  Cough: woken up like arm clock better p uses Hydrocone at hs  - worse when lie down q hs and when wakes up > green chunks / worse with laughter Sleep: flat bed  2 pillows  SABA use: tried saba didn't help/ ? Used DPI's that made it worse. (She can't remember name)  Pred helped the cough the most  Rec For cough/ wheeze/ rattle >   AirSupra   up to 2 pffs every 4 hours if needed  Prednisone  10 mg take  4 each am x 2 days,   2 each am x 2 days,  1 each am x 2 days and stop  Try prilosec  otc 20mg   Take 30-60 min before first meal of the day and Pepcid ac (famotidine) 20 mg one hour before bedtime until cough is completely gone for at least a week without the need for cough suppression GERD diet reviewed, bed blocks rec  Please schedule a follow up office visit in 4 weeks, sooner if needed  with all medications /inhalers/ solutions in hand  Allergy screen 10/19/2022 >  Eos 0.5 /  IgE  8  Sinus CT 10/21/22 1. Circumferential mucosal thickening affecting both maxillary sinuses, with layering fluid, more on the right than the left. This is consistent with maxillary rhinosinusitis. 2. Mild mucosal thickening and a small amount of  layering fluid affecting the sphenoid sinuses. 3. Obscuration of the ostiomeatal units by regional mucosal thickening. The patient does have a secondary ostium on the left side which is patent to the nasal passages. 4. Nasal septum bows 3 mm towards the right   [facility] surgery 01/19/23 surgical center  with persist globus but pt says was informed Dr Karis  doesn't do throat.     06/01/2023  f/u ov/Marylee Belzer re: UACS/sinusitis  maint on prilosec  20 mg / pepcid 20 mg   did not  bring meds  Chief Complaint  Patient presents with   Follow-up    Cough is better, but not gone.  Had sinus surgery and thought that would would take care of it.  She gave up her diet coke.  Wakes up gagging on mucous.  Dyspnea:  swimming again / steps ok  Cough: 3 mornings a week when sits up cough so hard she starts choking / gagging @ usual  time of morning to wake up but no noct spells> min mucoid production Sleeping: flat bed with 2 pillows  resp cc  SABA use: seems to help cough  Rec Try air supra up to 2 puffs every 4 hours as needed  Pantoprazole  (  protonix ) 40 mg  Take  30-60 min before first meal of the day and Pepcid (famotidine)  20 mg at bedtime  until return to office GERD diet reviewed, bed blocks rec  My office will be contacting you by phone for referral to Dr GORMAN [facility]   ENT end of aug 2025 sinuses are fine/ won't look at throat   09/23/2023  ACUTE ov/Dacy Enrico re: UACS/ sinusitis with refractory cough since 2013   maint on GERD / flonase    Chief Complaint  Patient presents with   Acute Visit    Throat irritation,cough,throwing up,recently diagnosed with acid reflex  Dyspnea:  good ex tol  = walking dog / [place]  Cough: worse p wine the night before and  typically worse immediately p waking x one hour with lots of white mucus but note it does not wake her prematurely  Sleeping: 30 degrees and 2 pillows  resp cc  SABA use: once every 3 weeks    No obvious day to day or daytime variability or  assoc excess/ purulent sputum or mucus plugs or hemoptysis or cp or chest tightness, subjective wheeze or overt  hb symptoms.    Also denies any obvious fluctuation of symptoms with weather or environmental changes or other aggravating or alleviating factors except as outlined above   No unusual exposure hx or h/o childhood pna/ asthma or knowledge of premature birth.  Current Allergies, Complete Past Medical History, Past Surgical History, Family History, and Social History were reviewed in [employer] record.  ROS  The following are not active complaints unless bolded Hoarseness, sore throat, dysphagia, dental problems, itching, sneezing,  nasal congestion or discharge of excess mucus or purulent secretions, ear ache,   fever, chills, sweats, unintended wt loss or wt gain, classically pleuritic or exertional cp,  orthopnea pnd or arm/hand swelling  or leg swelling, presyncope, palpitations, abdominal pain, anorexia, nausea, vomiting, diarrhea  or change in bowel habits or change in bladder habits, change in stools or change in urine, dysuria, hematuria,  rash, arthralgias, visual complaints, headache, numbness, weakness or ataxia or problems with walking or coordination,  change in mood or  memory.        Current Meds  Medication Sig   Albuterol -Budesonide (AIRSUPRA ) 90-80 MCG/ACT AERO Inhale 2 puffs into the lungs every 4 (four) hours as needed.   estradiol  (ESTRACE ) 0.1 MG/GM vaginal cream Place 0.5 g vaginally 2 (two) times a week. Place 0.5g nightly for two weeks then twice a week after   famotidine (PEPCID) 20 MG tablet Take 20 mg by mouth 2 (two) times daily.   fexofenadine (ALLEGRA) 180 MG tablet Take 180 mg by mouth daily.   fluticasone  (FLONASE ) 50 MCG/ACT nasal spray Place 2 sprays into both nostrils daily.   Multiple Vitamin (MULTIVITAMIN ADULT PO) Take by mouth.   nitrofurantoin , macrocrystal-monohydrate, (MACROBID ) 100 MG capsule Take 1 capsule (100 mg total)  by mouth 2 (two) times daily.   pantoprazole  (PROTONIX ) 40 MG tablet Take 1 tablet (40 mg total) by mouth daily.   VITAMIN D , CHOLECALCIFEROL, PO Take by mouth.               Past Medical History:  Diagnosis Date   Allergy    seasonal allergies   Arthritis    left great toe joint   Broken arm    right elbow & wrist   Colon polyps    benign   Depression    hx of post partum  Dermoid cyst of ovary    Eye disorder    possible glaucoma-being followed by Dr Carmelia   Fibrocystic breast    IBS (irritable bowel syndrome)    Plantar fasciitis    left foot 2/15-11/15   Polyp of duodenum 2014   tiny   Tendonitis    right knee 2/15-11/15   Torn rotator cuff    right      Objective:    Wts   09/23/2023         134   06/01/23 133 lb (60.3 kg)  04/20/23 127 lb 12.8 oz (58 kg)  03/22/23 133 lb (60.3 kg)     Vital signs reviewed  09/23/2023  - Note at rest 02 sats  96% on RA   General appearance:    amb hoarse wf freq throat clearing/ coughs at end of insp and early > late on exp    HEENT : Oropharynx  clear      Nasal turbinates nl    NECK :  without  apparent JVD/ palpable Nodes/TM    LUNGS: no acc muscle use,  Nl contour chest which is clear to A and P bilaterally     CV:  RRR  no s3 or murmur or increase in P2, and no edema   ABD:  soft and nontender   MS:  Gait nl   ext warm without deformities Or obvious joint restrictions  calf tenderness, cyanosis or clubbing    SKIN: warm and dry without lesions    NEURO:  alert, approp, nl sensorium with  no motor or cerebellar deficits apparent.     Assessment   Assessment & Plan Cough variant asthma Onset was Feb 2022 on a background  of chronic rhinitis  - Augmentin  rx 10/17/22   - Allergy screen 10/19/2022 >  Eos 0.5 /  IgE  8 - 10/19/2022  After extensive coaching inhaler device,  effectiveness =    75% from a baseline of 25% > Air surpa trial plus pred x 6 days/ max gerd rx and 4 week f/u  -  10/21/22 sinus CT  sinusitis (see upper airway cough syndrome)  - 06/01/2023  After extensive coaching inhaler device,  effectiveness =    75% (short ti) > continue air supra up to 2 q 4 h and if not helpful then stop it altogether as much of her problem is likely UACS at this point.   - 09/23/2023  After extensive coaching inhaler device,  effectiveness =    90% hfa > continue  - added singulair  09/23/2023 >>>   Still not clear what component is asthma vs uacs > consider MCT next once ent /GI evals complete   Upper airway cough syndrome - [facility] surgery 01/19/23 surgical center  with persistent  globus ever since surgery > informed Dr Karis doesn't do throats  - Refer to [facility] 06/01/2023 >>>   >>>  singulair  added plus  1st gen H1 blockers per guidelines  09/23/2023 >>>   May need gabapentin  trial next but defer this to ENT / also note has routine colonscopy coming up so rec add EGD as well (Dr Abran)   Discussed in detail all the  indications, usual  risks and alternatives  relative to the benefits with patient who agrees to proceed with Rx as outlined.          Each maintenance medication was reviewed in detail including emphasizing most importantly the difference between maintenance and prns and under what circumstances  the prns are to be triggered using an action plan format where appropriate.  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  Keep appt with Dr Brien for Sept 30 2025   For drainage / throat tickle try take CHLORPHENIRAMINE  4 mg  (Allergy Relief 4mg   at [facility] should be easiest to find in the blue box usually on bottom shelf)  take one every 4 hours as needed - extremely effective and inexpensive over the counter- may cause drowsiness so start with just a dose or two an hour before bedtime and see how you tolerate it before trying in daytime.                    Ozell America, MD 09/23/2023

## 2023-09-23 ENCOUNTER — Encounter: Payer: Self-pay | Admitting: Internal Medicine

## 2023-09-23 ENCOUNTER — Ambulatory Visit (INDEPENDENT_AMBULATORY_CARE_PROVIDER_SITE_OTHER): Admitting: Internal Medicine

## 2023-09-23 ENCOUNTER — Ambulatory Visit

## 2023-09-23 ENCOUNTER — Inpatient Hospital Stay
Admission: RE | Admit: 2023-09-23 | Discharge: 2023-09-23 | Discharge status: Home / Self Care | Source: Ambulatory Visit | Attending: Obstetrics & Gynecology | Admitting: Obstetrics & Gynecology

## 2023-09-23 ENCOUNTER — Telehealth: Payer: Self-pay

## 2023-09-23 VITALS — BP 122/79 | HR 64 | Temp 97.9°F | Ht 64.0 in | Wt 134.8 lb

## 2023-09-23 DIAGNOSIS — R928 Other abnormal and inconclusive findings on diagnostic imaging of breast: Secondary | ICD-10-CM

## 2023-09-23 DIAGNOSIS — J45991 Cough variant asthma: Secondary | ICD-10-CM

## 2023-09-23 DIAGNOSIS — R058 Other specified cough: Secondary | ICD-10-CM

## 2023-09-23 MED ORDER — AIRSUPRA 90-80 MCG/ACT IN AERO: 2.0000 | INHALATION_SPRAY | RESPIRATORY_TRACT | 11 refills | Status: DC | PRN

## 2023-09-23 MED ORDER — MONTELUKAST SODIUM 10 MG PO TABS: 10.0000 mg | ORAL_TABLET | Freq: Every day | ORAL | 11 refills | Status: AC

## 2023-09-23 MED ORDER — BENZONATATE 100 MG PO CAPS: 200.0000 mg | ORAL_CAPSULE | Freq: Two times a day (BID) | ORAL | 0 refills | Status: DC | PRN

## 2023-09-23 NOTE — Assessment & Plan Note (Addendum)
-   [facility] surgery 01/19/23 surgical center  with persistent  globus ever since surgery > informed Dr Karis doesn't do throats  - Refer to [facility] 06/01/2023 >>>   >>>  singulair  added plus  1st gen H1 blockers per guidelines  09/23/2023 >>>   May need gabapentin  trial next but defer this to ENT / also note has routine colonscopy coming up so rec add EGD as well (Dr Abran)   Discussed in detail all the  indications, usual  risks and alternatives  relative to the benefits with patient who agrees to proceed with Rx as outlined.          Each maintenance medication was reviewed in detail including emphasizing most importantly the difference between maintenance and prns and under what circumstances the prns are to be triggered using an action plan format where appropriate.  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

## 2023-09-23 NOTE — Patient Instructions (Addendum)
 Add singulair  10 mg each pm   Mucinex dm 1200 mg every 12 hours as needed and supplement with tessalon  200 up to every 4 hours as needed (non drowsy)   Blow air supra out thru nose  Also  Ok to try albuterol  15 min before an activity (on alternating days)  that you know would usually make you short of breath and see if it makes any difference and if makes none then don't take albuterol  after activity unless you can't catch your breath as this means it's the resting that helps, not the albuterol .  Keep appt with Dr Brien for Sept 30 2025   For drainage / throat tickle try take CHLORPHENIRAMINE  4 mg  (Allergy Relief 4mg   at [facility] should be easiest to find in the blue box usually on bottom shelf)  take one every 4 hours as needed - extremely effective and inexpensive over the counter- may cause drowsiness so start with just a dose or two an hour before bedtime and see how you tolerate it before trying in daytime.

## 2023-09-23 NOTE — Telephone Encounter (Signed)
 Copied from CRM [id number]. Topic: Clinical - Prescription Issue >> Sep 23, 2023  4:29 PM Celestine FALCON wrote: Reason for CRM: Pt saw Dr. Darlean today 09/23/23 and was told to use tessalon  200 up to every 4 hours as needed (non drowsy), but there is no prescription for that medication sent to her pharmacy. Pt prefers pharmacy [id number] - [place], [place] - [address] AT [facility] [address]. Pt's phone number is [phone] ok to leave a vm.   Pt will be leaving to [place] tomorrow in the evening and would like if the med can be called in.  Spoke with patient regarding prior message.Advised patient I have sent her script in for Tessalon  pearls sent into her pharmacy . Patient's voice was understanding.Nothing else further needed.

## 2023-09-23 NOTE — Telephone Encounter (Signed)
 Copied from CRM [id number]. Topic: Clinical - Prescription Issue >> Sep 23, 2023  4:29 PM Celestine FALCON wrote: Reason for CRM: Pt saw Dr. Darlean today 09/23/23 and was told to use tessalon  200 up to every 4 hours as needed (non drowsy), but there is no prescription for that medication sent to her pharmacy. Pt prefers pharmacy [facility] #[id number] - [place], [place] - [address] AT [facility] [address]. Pt's phone number is [phone] ok to leave a vm.   Pt will be leaving to [place] tomorrow in the evening and would like if the med can be called in.

## 2023-09-23 NOTE — Assessment & Plan Note (Addendum)
 Onset was Feb 2022 on a background  of chronic rhinitis  - Augmentin  rx 10/17/22   - Allergy screen 10/19/2022 >  Eos 0.5 /  IgE  8 - 10/19/2022  After extensive coaching inhaler device,  effectiveness =    75% from a baseline of 25% > Air surpa trial plus pred x 6 days/ max gerd rx and 4 week f/u  -  10/21/22 sinus CT sinusitis (see upper airway cough syndrome)  - 06/01/2023  After extensive coaching inhaler device,  effectiveness =    75% (short ti) > continue air supra up to 2 q 4 h and if not helpful then stop it altogether as much of her problem is likely UACS at this point.   - 09/23/2023  After extensive coaching inhaler device,  effectiveness =    90% hfa > continue  - added singulair  09/23/2023 >>>   Still not clear what component is asthma vs uacs > consider MCT next once ent /GI evals complete

## 2023-10-14 ENCOUNTER — Telehealth (HOSPITAL_BASED_OUTPATIENT_CLINIC_OR_DEPARTMENT_OTHER): Payer: Self-pay

## 2023-10-14 NOTE — Telephone Encounter (Signed)
 Please see additional telephone call dated 10/14/23.

## 2023-10-14 NOTE — Telephone Encounter (Signed)
 Left message to call Ramiel Forti at [phone].  Female stress incontinence [N39.3] Dermoid cyst of ovary, right [D27.0]

## 2023-10-14 NOTE — Telephone Encounter (Signed)
 Kimberly Stevens would like for you to call her back. She states she needs procedure codes, not diagnostic codes.

## 2023-10-14 NOTE — Telephone Encounter (Signed)
 Kimberly Stevens called back, she's sorry she missed your call but, she is by the phone now if you are able to call.

## 2023-10-14 NOTE — Telephone Encounter (Signed)
 Spoke with patient. Advised of ICD 10 codes for surgery. Patient verbalizes understanding and will contact her insurance company to review coverage.

## 2023-10-14 NOTE — Telephone Encounter (Signed)
 Patient called and stated that she would like to speak to someone about the about the ICD codes that will be used for her upcoming surgery.  She wants to run them by her insurance company to make sure she's covered.

## 2023-10-15 NOTE — Telephone Encounter (Signed)
 Spoke with patient. Provided CPT codes for surgery of 41338, [id number], and 57240. Patient verbalizes understanding and will contact her insurance company to verify benefits.

## 2023-10-18 ENCOUNTER — Ambulatory Visit (HOSPITAL_BASED_OUTPATIENT_CLINIC_OR_DEPARTMENT_OTHER): Admitting: Obstetrics & Gynecology

## 2023-10-18 ENCOUNTER — Encounter (HOSPITAL_BASED_OUTPATIENT_CLINIC_OR_DEPARTMENT_OTHER): Payer: Self-pay | Admitting: Obstetrics & Gynecology

## 2023-10-18 VITALS — BP 112/79 | HR 66 | Ht 64.0 in | Wt 132.0 lb

## 2023-10-18 DIAGNOSIS — D270 Benign neoplasm of right ovary: Secondary | ICD-10-CM

## 2023-10-18 NOTE — Progress Notes (Signed)
 GYNECOLOGY  VISIT  CC:   surgical discussion  HPI: 58 y.o. G12P2002 Married White or Caucasian female here for pre-op appointment.  She is having cystocele repair and mid-urethral sling done with Dr. Guadlupe.  She had a dermoid that has been present and followed conservatively for years.  She has decided to have this removed.  In addition, she has been counseled about removal of second fallopian.tube.  Discussed pros/cons of removal of second ovary as well.  At this time, she leaning towards removal of second ovary.  Procedure discussed with patient.  Hospital stay, recovery and pain management all discussed.  Risks discussed including but not limited to bleeding, <1% risk of receiving a  transfusion, infection, 1-2% risk of bowel/bladder/ureteral/vascular injury discussed as well as possible need for additional surgery if injury does occur discussed.  DVT/PE and rare risk of death discussed.  My actual complications with prior surgeries discussed.  Hernia formation discussed.  Positioning and incision locations discussed.  Patient aware if pathology abnormal she may need additional treatment.  All questions answered.     Past Medical History:  Diagnosis Date   Allergy    seasonal allergies   Arthritis    left great toe joint   Broken arm    right elbow & wrist   Colon polyps    benign   Depression    hx of post partum   Dermoid cyst of ovary    Eye disorder    possible glaucoma-being followed by Dr Carmelia   Fibrocystic breast    IBS (irritable bowel syndrome)    Plantar fasciitis    left foot 2/15-11/15   Polyp of duodenum 2014   tiny   Tendonitis    right knee 2/15-11/15   Torn rotator cuff    right    MEDS:   Current Outpatient Medications on File Prior to Visit  Medication Sig Dispense Refill   Albuterol -Budesonide (AIRSUPRA ) 90-80 MCG/ACT AERO Inhale 2 puffs into the lungs every 4 (four) hours as needed. 10.7 g 11   benzonatate  (TESSALON ) 100 MG capsule Take 2 capsules (200 mg  total) by mouth every 12 (twelve) hours as needed for cough. 120 capsule 0   chlorpheniramine (CHLOR-TRIMETON) 4 MG tablet Take 4 mg by mouth as needed for allergies.     famotidine (PEPCID) 20 MG tablet Take 20 mg by mouth 2 (two) times daily.     fluticasone  (FLONASE ) 50 MCG/ACT nasal spray Place 2 sprays into both nostrils daily. 16 g 10   montelukast  (SINGULAIR ) 10 MG tablet Take 1 tablet (10 mg total) by mouth at bedtime. 30 tablet 11   Multiple Vitamin (MULTIVITAMIN ADULT PO) Take by mouth.     pantoprazole  (PROTONIX ) 40 MG tablet Take 1 tablet (40 mg total) by mouth daily. 30 tablet 2   VITAMIN D , CHOLECALCIFEROL, PO Take by mouth.     estradiol  (ESTRACE ) 0.1 MG/GM vaginal cream Place 0.5 g vaginally 2 (two) times a week. Place 0.5g nightly for two weeks then twice a week after 30 g 3   No current facility-administered medications on file prior to visit.    ALLERGIES: Gluten meal and Lactose intolerance (gi)  SH:  married, non soker  Review of Systems  Constitutional: Negative.   Genitourinary: Negative.     PHYSICAL EXAMINATION:    BP 112/79 (BP Location: Right Arm, Patient Position: Sitting, Cuff Size: Normal)   Pulse 66   Ht 5' 4 (1.626 m)   Wt 132 lb (59.9 kg)  LMP 06/20/2017 (Approximate)   SpO2 100%   BMI 22.66 kg/m     Physical Exam Constitutional:      Appearance: Normal appearance.  Neurological:     General: No focal deficit present.     Mental Status: She is alert.  Psychiatric:        Mood and Affect: Mood normal.        Behavior: Behavior normal.    Assessment/Plan: 1. Dermoid cyst of right ovary (Primary) - pt is leaning towards laparoscopic BSO at this time - has appt with Dr. Guadlupe coming up and will review with her then

## 2023-10-21 ENCOUNTER — Ambulatory Visit (INDEPENDENT_AMBULATORY_CARE_PROVIDER_SITE_OTHER): Admitting: Obstetrics and Gynecology

## 2023-10-21 ENCOUNTER — Encounter: Payer: Self-pay | Admitting: Obstetrics and Gynecology

## 2023-10-21 VITALS — BP 128/85 | HR 71 | Wt 132.0 lb

## 2023-10-21 DIAGNOSIS — N8110 Cystocele, unspecified: Secondary | ICD-10-CM

## 2023-10-21 DIAGNOSIS — Z01818 Encounter for other preprocedural examination: Secondary | ICD-10-CM

## 2023-10-21 MED ORDER — ACETAMINOPHEN 500 MG PO TABS: 500.0000 mg | ORAL_TABLET | Freq: Four times a day (QID) | ORAL | 0 refills | Status: DC | PRN

## 2023-10-21 MED ORDER — POLYETHYLENE GLYCOL 3350 17 GM/SCOOP PO POWD: 17.0000 g | Freq: Every day | ORAL | 0 refills | Status: AC

## 2023-10-21 MED ORDER — IBUPROFEN 600 MG PO TABS: 600.0000 mg | ORAL_TABLET | Freq: Four times a day (QID) | ORAL | 0 refills | Status: DC | PRN

## 2023-10-21 MED ORDER — TRAMADOL HCL 50 MG PO TABS: 50.0000 mg | ORAL_TABLET | Freq: Three times a day (TID) | ORAL | 0 refills | Status: AC | PRN

## 2023-10-21 NOTE — Progress Notes (Signed)
 [facility] Pre-Operative Exam  Subjective Chief Complaint: Kimberly Stevens presents for a preoperative encounter.   History of Present Illness: Kimberly Stevens is a 58 y.o. female who presents for preoperative visit.  She is scheduled to undergo Exam under anesthesia, anterior and posterior repair, perineorrhaphy, midurethral sling, and cystoscopy   on 11/09/23.  Her symptoms include pelvic organ prolapse and stress urinary incontinence , and she was was found to have Stage II anterior, Stage I posterior, Stage 0-1 apical prolapse.   Urodynamics showed: Deferred, +CST  Past Medical History:  Diagnosis Date   Allergy    seasonal allergies   Arthritis    left great toe joint   Broken arm    right elbow & wrist   Colon polyps    benign   Depression    hx of post partum   Dermoid cyst of ovary    Eye disorder    possible glaucoma-being followed by Dr Carmelia   Fibrocystic breast    IBS (irritable bowel syndrome)    Plantar fasciitis    left foot 2/15-11/15   Polyp of duodenum 2014   tiny   Tendonitis    right knee 2/15-11/15   Torn rotator cuff    right     Past Surgical History:  Procedure Laterality Date   BROW LIFT Bilateral 02/22/2020   Procedure: BLEPHAROPLASTY UPPER EYELID; W/EXCESS SKIN BILATERAL;  Surgeon: Ashley Greig HERO, MD;  Location: [facility];  Service: Ophthalmology;  Laterality: Bilateral;   COLONOSCOPY  2014   JP-MAC-movi(exc)-poylpoid   NASAL SINUS SURGERY  01/19/2023   POLYPECTOMY  2014   polypoid   WISDOM TOOTH EXTRACTION      is allergic to gluten meal and lactose intolerance (gi).   Family History  Problem Relation Age of Onset   Arthritis Mother        70 in 2022. shoulder, knee surgery.   Multiple myeloma Father        46 in 2022. around mid 60s.   Diverticulitis Father    Hearing loss Father        and poor vision   Hepatitis Father        B she believes   Macular degeneration Father    Leukemia Father     Conductive hearing loss Father    Healthy Sister    Healthy Sister    Leukemia Maternal Uncle    Arthritis Maternal Grandmother    Heart attack Maternal Grandmother        age 17   Arthritis Paternal Grandmother    Colon cancer Neg Hx    Colon polyps Neg Hx    Liver disease Neg Hx    Pancreatic cancer Neg Hx    Esophageal cancer Neg Hx    Stomach cancer Neg Hx    Rectal cancer Neg Hx    Breast cancer Neg Hx     Social History   Tobacco Use   Smoking status: Never    Passive exposure: Never   Smokeless tobacco: Never  Vaping Use   Vaping status: Never Used  Substance Use Topics   Alcohol use: Yes    Alcohol/week: 4.0 standard drinks of alcohol    Types: 4 Standard drinks or equivalent per week    Comment: 4 per week   Drug use: No     Review of Systems was negative for a full 10 system review except as noted in the History of Present Illness.   Current  Outpatient Medications:    Albuterol -Budesonide (AIRSUPRA ) 90-80 MCG/ACT AERO, Inhale 2 puffs into the lungs every 4 (four) hours as needed., Disp: 10.7 g, Rfl: 11   benzonatate  (TESSALON ) 100 MG capsule, Take 2 capsules (200 mg total) by mouth every 12 (twelve) hours as needed for cough., Disp: 120 capsule, Rfl: 0   chlorpheniramine (CHLOR-TRIMETON) 4 MG tablet, Take 4 mg by mouth as needed for allergies., Disp: , Rfl:    estradiol  (ESTRACE ) 0.1 MG/GM vaginal cream, Place 0.5 g vaginally 2 (two) times a week. Place 0.5g nightly for two weeks then twice a week after, Disp: 30 g, Rfl: 3   famotidine (PEPCID) 20 MG tablet, Take 20 mg by mouth 2 (two) times daily., Disp: , Rfl:    fluticasone  (FLONASE ) 50 MCG/ACT nasal spray, Place 2 sprays into both nostrils daily., Disp: 16 g, Rfl: 10   montelukast  (SINGULAIR ) 10 MG tablet, Take 1 tablet (10 mg total) by mouth at bedtime., Disp: 30 tablet, Rfl: 11   Multiple Vitamin (MULTIVITAMIN ADULT PO), Take by mouth., Disp: , Rfl:    pantoprazole  (PROTONIX ) 40 MG tablet, Take 1 tablet  (40 mg total) by mouth daily., Disp: 30 tablet, Rfl: 2   VITAMIN D , CHOLECALCIFEROL, PO, Take by mouth., Disp: , Rfl:    Objective Vitals:   10/21/23 0849  BP: 128/85  Pulse: 71    Gen: NAD CV: S1 S2 RRR Lungs: Clear to auscultation bilaterally Abd: soft, nontender   Previous Pelvic Exam showed: POP-Q   -1 (positive leak)                                            Aa   -1                                           Ba   -7                                              C    1                                            Gh   3                                            Pb   8                                            tvl    -2                                            Ap   -2  Bp   -7       Assessment/ Plan  Assessment: The patient is a 58 y.o. year old scheduled to undergo Exam under anesthesia, anterior and posterior repair, perineorrhaphy, midurethral sling, and cystoscopy. Verbal consent was obtained for these procedures.  Plan: General Surgical Consent: The patient has previously been counseled on alternative treatments, and the decision by the patient and provider was to proceed with the procedure listed above.  For all procedures, there are risks of bleeding, infection, damage to surrounding organs including but not limited to bowel, bladder, blood vessels, ureters and nerves, and need for further surgery if an injury were to occur. These risks are all low with minimally invasive surgery.   There are risks of numbness and weakness at any body site or buttock/rectal pain.  It is possible that baseline pain can be worsened by surgery, either with or without mesh. If surgery is vaginal, there is also a low risk of possible conversion to laparoscopy or open abdominal incision where indicated. Very rare risks include blood transfusion, blood clot, heart attack, pneumonia, or death.   There is also a risk of short-term  postoperative urinary retention with need to use a catheter. About half of patients need to go home from surgery with a catheter, which is then later removed in the office. The risk of long-term need for a catheter is very low. There is also a risk of worsening of overactive bladder.   Sling: The effectiveness of a midurethral vaginal mesh sling is approximately 85%, and thus, there will be times when you may leak urine after surgery, especially if your bladder is full or if you have a strong cough. There is a balance between making the sling tight enough to treat your leakage but not too tight so that you have long-term difficulty emptying your bladder. A mesh sling will not directly treat overactive bladder/urge incontinence and may worsen it.  There is an FDA safety notification on vaginal mesh procedures for prolapse but NOT mesh slings. We have extensive experience and training with mesh placement and we have close postoperative follow up to identify any potential complications from mesh. It is important to realize that this mesh is a permanent implant that cannot be easily removed. There are rare risks of mesh exposure (2-4%), pain with intercourse (0-7%), and infection (<1%). The risk of mesh exposure if more likely in a woman with risks for poor healing (prior radiation, poorly controlled diabetes, or immunocompromised). The risk of new or worsened chronic pain after mesh implant is more common in women with baseline chronic pain and/or poorly controlled anxiety or depression. Approximately 2-4% of patients will experience longer-term post-operative voiding dysfunction that may require surgical revision of the sling. We also reviewed that postoperatively, her stream may not be as strong as before surgery.   Prolapse (with or without mesh): Risk factors for surgical failure  include things that put pressure on your pelvis and the surgical repair, including obesity, chronic cough, and heavy lifting or  straining (including lifting children or adults, straining on the toilet, or lifting heavy objects such as furniture or anything weighing >25 lbs. Risks of recurrence is 20-30% with vaginal native tissue repair and a less than 10% with sacrocolpopexy with mesh.     We discussed consent for blood products. Risks for blood transfusion include allergic reactions, other reactions that can affect different body organs and managed accordingly, transmission of infectious diseases such as HIV or Hepatitis. However, the blood is screened.  Patient consents for blood products.  Pre-operative instructions:  She was instructed to not take Aspirin /NSAIDs x 7days prior to surgery. SAntibiotic prophylaxis was ordered as indicated.  Catheter use: Patient will go home with foley if needed after post-operative voiding trial.  Post-operative instructions:  She was provided with specific post-operative instructions, including precautions and signs/symptoms for which we would recommend contacting us , in addition to daytime and after-hours contact phone numbers. This was provided on a handout.   Post-operative medications: Prescriptions for motrin , tylenol , miralax, and Tramadol  were sent to her pharmacy. Discussed using ibuprofen  and tylenol  on a schedule to limit use of narcotics.   Laboratory testing:  We will check labs: As requested by Dr. Cleotilde or Anesthesia.   Preoperative clearance:  She does not require surgical clearance.    Post-operative follow-up:  A post-operative appointment will be made for 6 weeks from the date of surgery. If she needs a post-operative nurse visit for a voiding trial, that will be set up after she leaves the hospital.    Patient will call the clinic or use MyChart should anything change or any new issues arise.   Kimberly Abdallah G Orra Nolde, NP

## 2023-10-21 NOTE — H&P (Signed)
 [facility] H&P  Subjective Chief Complaint: Kimberly Stevens presents for a preoperative encounter.   History of Present Illness: Kimberly Stevens is a 58 y.o. female who presents for preoperative visit.  She is scheduled to undergo Exam under anesthesia, anterior and posterior repair, perineorrhaphy, midurethral sling, and cystoscopy   on 11/09/23.  Her symptoms include pelvic organ prolapse and stress urinary incontinence , and she was was found to have Stage II anterior, Stage I posterior, Stage 0-1 apical prolapse.   Urodynamics showed: Deferred, +CST  Past Medical History:  Diagnosis Date   Allergy    seasonal allergies   Arthritis    left great toe joint   Broken arm    right elbow & wrist   Colon polyps    benign   Depression    hx of post partum   Dermoid cyst of ovary    Eye disorder    possible glaucoma-being followed by Dr Carmelia   Fibrocystic breast    IBS (irritable bowel syndrome)    Plantar fasciitis    left foot 2/15-11/15   Polyp of duodenum 2014   tiny   Tendonitis    right knee 2/15-11/15   Torn rotator cuff    right     Past Surgical History:  Procedure Laterality Date   BROW LIFT Bilateral 02/22/2020   Procedure: BLEPHAROPLASTY UPPER EYELID; W/EXCESS SKIN BILATERAL;  Surgeon: Ashley Greig HERO, MD;  Location: [facility];  Service: Ophthalmology;  Laterality: Bilateral;   COLONOSCOPY  2014   JP-MAC-movi(exc)-poylpoid   NASAL SINUS SURGERY  01/19/2023   POLYPECTOMY  2014   polypoid   WISDOM TOOTH EXTRACTION      is allergic to gluten meal and lactose intolerance (gi).   Family History  Problem Relation Age of Onset   Arthritis Mother        59 in 2022. shoulder, knee surgery.   Multiple myeloma Father        49 in 2022. around mid 60s.   Diverticulitis Father    Hearing loss Father        and poor vision   Hepatitis Father        B she believes   Macular degeneration Father    Leukemia Father    Conductive  hearing loss Father    Healthy Sister    Healthy Sister    Leukemia Maternal Uncle    Arthritis Maternal Grandmother    Heart attack Maternal Grandmother        age 37   Arthritis Paternal Grandmother    Colon cancer Neg Hx    Colon polyps Neg Hx    Liver disease Neg Hx    Pancreatic cancer Neg Hx    Esophageal cancer Neg Hx    Stomach cancer Neg Hx    Rectal cancer Neg Hx    Breast cancer Neg Hx     Social History   Tobacco Use   Smoking status: Never    Passive exposure: Never   Smokeless tobacco: Never  Vaping Use   Vaping status: Never Used  Substance Use Topics   Alcohol use: Yes    Alcohol/week: 4.0 standard drinks of alcohol    Types: 4 Standard drinks or equivalent per week    Comment: 4 per week   Drug use: No     Review of Systems was negative for a full 10 system review except as noted in the History of Present Illness.  No current facility-administered  medications for this encounter.  Current Outpatient Medications:    acetaminophen  (TYLENOL ) 500 MG tablet, Take 1 tablet (500 mg total) by mouth every 6 (six) hours as needed (pain)., Disp: 30 tablet, Rfl: 0   Albuterol -Budesonide (AIRSUPRA ) 90-80 MCG/ACT AERO, Inhale 2 puffs into the lungs every 4 (four) hours as needed., Disp: 10.7 g, Rfl: 11   benzonatate  (TESSALON ) 100 MG capsule, Take 2 capsules (200 mg total) by mouth every 12 (twelve) hours as needed for cough., Disp: 120 capsule, Rfl: 0   chlorpheniramine (CHLOR-TRIMETON) 4 MG tablet, Take 4 mg by mouth as needed for allergies., Disp: , Rfl:    estradiol  (ESTRACE ) 0.1 MG/GM vaginal cream, Place 0.5 g vaginally 2 (two) times a week. Place 0.5g nightly for two weeks then twice a week after, Disp: 30 g, Rfl: 3   famotidine (PEPCID) 20 MG tablet, Take 20 mg by mouth 2 (two) times daily., Disp: , Rfl:    fluticasone  (FLONASE ) 50 MCG/ACT nasal spray, Place 2 sprays into both nostrils daily., Disp: 16 g, Rfl: 10   ibuprofen  (ADVIL ) 600 MG tablet, Take 1  tablet (600 mg total) by mouth every 6 (six) hours as needed., Disp: 30 tablet, Rfl: 0   montelukast  (SINGULAIR ) 10 MG tablet, Take 1 tablet (10 mg total) by mouth at bedtime., Disp: 30 tablet, Rfl: 11   Multiple Vitamin (MULTIVITAMIN ADULT PO), Take by mouth., Disp: , Rfl:    pantoprazole  (PROTONIX ) 40 MG tablet, Take 1 tablet (40 mg total) by mouth daily., Disp: 30 tablet, Rfl: 2   polyethylene glycol powder (GLYCOLAX/MIRALAX) 17 GM/SCOOP powder, Take 17 g by mouth daily. Drink 17g (1 scoop) dissolved in water per day., Disp: 255 g, Rfl: 0   traMADol  (ULTRAM ) 50 MG tablet, Take 1 tablet (50 mg total) by mouth every 8 (eight) hours as needed for up to 5 days., Disp: 15 tablet, Rfl: 0   VITAMIN D , CHOLECALCIFEROL, PO, Take by mouth., Disp: , Rfl:    Objective There were no vitals filed for this visit.   Gen: NAD CV: S1 S2 RRR Lungs: Clear to auscultation bilaterally Abd: soft, nontender   Previous Pelvic Exam showed: POP-Q   -1 (positive leak)                                            Aa   -1                                           Ba   -7                                              C    1                                            Gh   3  Pb   8                                            tvl    -2                                            Ap   -2                                            Bp   -7       Assessment/ Plan  Assessment: The patient is a 58 y.o. year old scheduled to undergo Exam under anesthesia, anterior and posterior repair, perineorrhaphy, midurethral sling, and cystoscopy. Verbal consent was obtained for these procedures.

## 2023-10-23 ENCOUNTER — Other Ambulatory Visit: Payer: Self-pay | Admitting: Internal Medicine

## 2023-10-26 ENCOUNTER — Telehealth (HOSPITAL_BASED_OUTPATIENT_CLINIC_OR_DEPARTMENT_OTHER): Payer: Self-pay

## 2023-10-26 NOTE — Telephone Encounter (Signed)
 Spoke with patient. Patient would like Dr.Miller to know that she has decided she would like to remove both ovaries with her surgery on 10/21. Would also like to know if she is able to have baseline hormonal lab testing performed prior to having surgery. Advised will review with Dr.Miller and return call. Patient is agreeable.

## 2023-10-29 ENCOUNTER — Ambulatory Visit (INDEPENDENT_AMBULATORY_CARE_PROVIDER_SITE_OTHER): Admitting: Internal Medicine

## 2023-10-29 ENCOUNTER — Ambulatory Visit: Payer: Self-pay

## 2023-10-29 ENCOUNTER — Ambulatory Visit (HOSPITAL_BASED_OUTPATIENT_CLINIC_OR_DEPARTMENT_OTHER)
Admission: RE | Admit: 2023-10-29 | Discharge: 2023-10-29 | Disposition: A | Discharge status: Home / Self Care | Source: Ambulatory Visit | Attending: Internal Medicine | Admitting: Internal Medicine

## 2023-10-29 ENCOUNTER — Encounter: Payer: Self-pay | Admitting: Internal Medicine

## 2023-10-29 VITALS — BP 114/68 | HR 59 | Temp 97.2°F | Ht 64.0 in | Wt 131.0 lb

## 2023-10-29 DIAGNOSIS — R1011 Right upper quadrant pain: Secondary | ICD-10-CM | POA: Diagnosis not present

## 2023-10-29 DIAGNOSIS — N390 Urinary tract infection, site not specified: Secondary | ICD-10-CM | POA: Diagnosis present

## 2023-10-29 DIAGNOSIS — S3991XA Unspecified injury of abdomen, initial encounter: Secondary | ICD-10-CM

## 2023-10-29 LAB — CBC WITH DIFFERENTIAL/PLATELET
Basophils Absolute: 0.1 K/uL (ref 0.0–0.1)
Basophils Relative: 1.3 % (ref 0.0–3.0)
Eosinophils Absolute: 0.2 K/uL (ref 0.0–0.7)
Eosinophils Relative: 3.9 % (ref 0.0–5.0)
HCT: 46 % (ref 36.0–46.0)
Hemoglobin: 15 g/dL (ref 12.0–15.0)
Lymphocytes Relative: 28.4 % (ref 12.0–46.0)
Lymphs Abs: 1.3 K/uL (ref 0.7–4.0)
MCHC: 32.5 g/dL (ref 30.0–36.0)
MCV: 98.1 fl (ref 78.0–100.0)
Monocytes Absolute: 0.4 K/uL (ref 0.1–1.0)
Monocytes Relative: 7.7 % (ref 3.0–12.0)
Neutro Abs: 2.7 K/uL (ref 1.4–7.7)
Neutrophils Relative %: 58.7 % (ref 43.0–77.0)
Platelets: 250 K/uL (ref 150.0–400.0)
RBC: 4.69 Mil/uL (ref 3.87–5.11)
RDW: 12.9 % (ref 11.5–15.5)
WBC: 4.6 K/uL (ref 4.0–10.5)

## 2023-10-29 LAB — COMPREHENSIVE METABOLIC PANEL WITH GFR
ALT: 16 U/L (ref 0–35)
AST: 21 U/L (ref 0–37)
Albumin: 4.8 g/dL (ref 3.5–5.2)
Alkaline Phosphatase: 60 U/L (ref 39–117)
BUN: 14 mg/dL (ref 6–23)
CO2: 31 meq/L (ref 19–32)
Calcium: 9.6 mg/dL (ref 8.4–10.5)
Chloride: 105 meq/L (ref 96–112)
Creatinine, Ser: 0.88 mg/dL (ref 0.40–1.20)
GFR: 72.04 mL/min
Glucose, Bld: 89 mg/dL (ref 70–99)
Potassium: 4.9 meq/L (ref 3.5–5.1)
Sodium: 142 meq/L (ref 135–145)
Total Bilirubin: 0.5 mg/dL (ref 0.2–1.2)
Total Protein: 7 g/dL (ref 6.0–8.3)

## 2023-10-29 MED ORDER — TRAMADOL HCL 50 MG PO TABS: 50.0000 mg | ORAL_TABLET | Freq: Three times a day (TID) | ORAL | 0 refills | Status: AC | PRN

## 2023-10-29 NOTE — Telephone Encounter (Signed)
 FYI Only or Action Required?: FYI only for provider.  Patient was last seen in primary care on 04/20/2023 by Katrinka Garnette KIDD, MD.  Called Nurse Triage reporting Cough and Rib Injury.  Symptoms began several days ago.  Interventions attempted: OTC medications: Tylenol  and Ice/heat application.  Symptoms are: right rib pain after injury (leaned on edge of recycling bin putting her weight on the ribs) stable.  Triage Disposition: See Physician Within 24 Hours  Patient/caregiver understands and will follow disposition?: Yes            Copied from CRM [id number]. Topic: Clinical - Red Word Triage >> Oct 29, 2023  7:46 AM Kimberly Stevens wrote: Red Word that prompted transfer to Nurse Triage: Pain in ribs that's getting worse.. Concerned for her surgery on october 21st. Hurts when coughing. Reason for Disposition  [1] MODERATE pain (e.g., interferes with normal activities) AND [2] high-risk adult (e.g., age > 60 years, osteoporosis, chronic steroid use)  Answer Assessment - Initial Assessment Questions Patient has also had a cough for months. She states she is seeing a specialist for it. She states coughing makes the right ribs hurt. She states she would like to come in for a chest Xray to make sure it is just bruised due to she has an upcoming surgery.  1. MECHANISM: How did the injury happen?     She states she was leaning over the recycling bin. She states when she was leaning she put all her weight on her rihgt lower rib cage and states it caught the edge of her recycling bin. She states she also was doing core exercise (sit ups, crunches) since Monday which she thinks has also made it worse.  2. ONSET: When did the injury happen? (.e.g., minutes, hours, days ago)     Sunday afternoon.  3. LOCATION: Where on the chest is the injury located?     Right lower ribs.  4. APPEARANCE: What does the injury look like?     No bruising.  5. BLEEDING: Is there any bleeding now? If  Yes, ask: How long has it been bleeding?    No.  N/A.  6. SEVERITY: Any difficulty with breathing?     No. She states she is not SOB but it is painful when taking a deep breath.  7. SIZE: For cuts, bruises, or swelling, ask: How large is it? (e.g., inches or centimeters)     No.  8. PAIN: Is there pain? If Yes, ask: How bad is the pain? (e.g., Scale 0-10; none, mild, moderate, severe)     6/10. Describes it like muscle pain, 8-9/10 with certain movements. Took Tylenol  this morning.  9. TETANUS: For any breaks in the skin, ask: When was your last tetanus booster?     N/A.  10. PREGNANCY: Is there any chance you are pregnant? When was your last menstrual period?       N/A.  Protocols used: Chest Injury-A-AH

## 2023-10-29 NOTE — Telephone Encounter (Signed)
 noted

## 2023-10-29 NOTE — Progress Notes (Signed)
 ==============================  [facility]: [phone]   -- Medical Office Visit --  Patient: Kimberly Stevens      Age: 58 y.o.       Sex:  female  Date:   10/29/2023 Today's Healthcare Provider: Bernardino KANDICE Cone, MD  ==============================   Chief Complaint: Rib cage pain (Pt c/o rib pain, present since Sunday after reaching into a recycling bin. Sx are worsening. Has tried tylenol . )  Discussed the use of AI scribe software for clinical note transcription with the patient, who gave verbal consent to proceed. History of Present Illness  58 year old female who presents with right upper quadrant pain after trauma.  She experiences right upper quadrant pain following an incident where she leaned over a trash can, placing all her weight on the edge of the can against her lower ribcage. She felt pressure at the time of the incident but did not hear a pop or crack. The pain has worsened over the past few days, particularly at night, affecting her sleep. It is exacerbated by coughing and certain movements.  She did not initially treat the injury with ice or rest and continued to perform core exercises, which may have aggravated the condition. The pain is persistent, described as a six or seven out of ten, increasing with certain movements. No bruising has been observed in the area. The pain has been increasing each day over the past several days.She is scheduled for upcoming surgery to remove her ovaries and tubes and to have a mesh sling placed on her bladder. She is concerned that the current pain might affect her ability to undergo the surgery. She has been taking Tylenol  for pain relief and has tramadol  available for use, which she has not yet taken.  She has a history of chronic cough and has undergone chest x-rays in the past. She is not on any blood thinners and takes vitamin D  regularly. She reports that her blood pressure was lower than usual  during this visit, whereas it was higher in previous preoperative assessments, however it usually runs on the low side and she is not dizzy with sit to stand.   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 Past Medical History:  has a past medical history of Allergy, Arthritis, Broken arm, Colon polyps, Depression, Dermoid cyst of ovary, Eye disorder, Fibrocystic breast, IBS (irritable bowel syndrome), Plantar fasciitis, Polyp of duodenum (2014), Tendonitis, and Torn rotator cuff. Past Surgical History:   has a past surgical history that includes Wisdom tooth extraction; Brow lift (Bilateral, 02/22/2020); Colonoscopy (2014); Polypectomy (2014); and Nasal sinus surgery (01/19/2023). Social History:   reports that she has never smoked. She has never been exposed to tobacco smoke. She has never used smokeless tobacco. She reports current alcohol use of about 4.0 standard drinks of alcohol per week. She reports that she does not use drugs. Family History:  family history includes Arthritis in her maternal grandmother, mother, and paternal grandmother; Conductive hearing loss in her father; Diverticulitis in her  father; Healthy in her sister and sister; Hearing loss in her father; Heart attack in her maternal grandmother; Hepatitis in her father; Leukemia in her father and maternal uncle; Macular degeneration in her  father; Multiple myeloma in her father. Allergies:  is allergic to gluten meal and lactose intolerance (gi).   Medication Reconciliation: Current Outpatient Medications on File Prior to Visit  Medication Sig   acetaminophen  (TYLENOL ) 500 MG tablet Take 1 tablet (500 mg total) by mouth every 6 (six) hours as needed (pain).   Albuterol -Budesonide (AIRSUPRA ) 90-80 MCG/ACT AERO Inhale 2 puffs into the lungs every 4 (four) hours as needed.   benzonatate  (TESSALON ) 100 MG capsule Take 2 capsules (200 mg total) by mouth every 12 (twelve) hours as needed for cough.   chlorpheniramine (CHLOR-TRIMETON) 4 MG tablet Take 4 mg by mouth as needed for allergies.   estradiol  (ESTRACE ) 0.1 MG/GM vaginal cream Place 0.5 g vaginally 2 (two) times a week. Place 0.5g nightly for two weeks then twice a week after   famotidine (PEPCID) 20 MG tablet Take 20 mg by mouth 2 (two) times daily.   fluticasone  (FLONASE ) 50 MCG/ACT nasal spray Place 2 sprays into both nostrils daily.   montelukast  (SINGULAIR ) 10 MG tablet Take 1 tablet (10 mg total) by mouth at bedtime.   Multiple Vitamin (MULTIVITAMIN ADULT PO) Take by mouth.   pantoprazole  (PROTONIX ) 40 MG tablet TAKE 1 TABLET(40 MG) BY MOUTH DAILY   VITAMIN D , CHOLECALCIFEROL, PO Take by mouth.   ibuprofen  (ADVIL ) 600 MG tablet Take 1 tablet (600 mg total) by mouth every 6 (six) hours as needed.   No current facility-administered medications on file prior to visit.  There are no discontinued medications.   Physical Exam:    10/29/2023    9:17 AM 10/29/2023    8:49 AM 10/21/2023    8:49 AM  Vitals with BMI  Height  5' 4   Weight  131 lbs 132 lbs  BMI  22.47 22.65  Systolic 114 94 128  Diastolic 68 58 85  Pulse  59 71  Vital signs reviewed.  Nursing notes reviewed. Weight trend reviewed. Physical Activity: Sufficiently Active (07/20/2022)   Exercise Vital Sign    Days of Exercise per Week: 7 days    Minutes of Exercise per Session: 60 min   General  Appearance:  No acute distress appreciable.   Well-groomed, healthy-appearing female.  Well proportioned with no abnormal fat distribution.  Good muscle tone. Pulmonary:  Normal work of breathing at rest, no respiratory distress apparent. SpO2: 96 %  Musculoskeletal: All extremities are intact.  Neurological:  Awake, alert, oriented, and engaged.  No obvious focal neurological deficits or cognitive impairments.  Sensorium seems unclouded.   Speech is clear and coherent with logical content. Psychiatric:  Appropriate mood, pleasant and cooperative demeanor, thoughtful and engaged during the exam  Physical Exam Abdominal:      Comments: Brown/orange is tender and warm, red area is exquisitely tender with guarding and very warm.  No bruising but there is visible swelling.       Orders Only on 08/23/2023  Component Date Value Ref Range Status   Urine Culture, Routine 08/23/2023 Final report   Final   Organism ID, Bacteria 08/23/2023 Comment   Final  Clinical Support on 08/23/2023  Component Date Value Ref Range Status   Color, UA 08/23/2023 Yellow   Final   Clarity, UA 08/23/2023 Clear   Final   Glucose, UA 08/23/2023 Negative  Negative Final  Bilirubin, UA 08/23/2023 Negative   Final   Ketones, UA 08/23/2023 Negative   Final   Spec Grav, UA 08/23/2023 1.010  1.010 - 1.025 Final   Blood, UA 08/23/2023 Trace   Final   pH, UA 08/23/2023 7.0  5.0 - 8.0 Final   Protein, UA 08/23/2023 Negative  Negative Final   Urobilinogen, UA 08/23/2023 0.2  0.2 or 1.0 E.U./dL Final   Nitrite, UA 91/95/7974 Positive   Final   Leukocytes, UA 08/23/2023 Trace (A)  Negative Final   Appearance 08/23/2023 Yellow   Final   Odor 08/23/2023 NA   Final  Hospital Outpatient Visit on 06/17/2023  Component Date Value Ref Range Status   Specimen Description 06/17/2023 URINE, RANDOM   Final   Special Requests 06/17/2023 NONE   Final   Culture 06/17/2023  (A)   Final                   Value:<10,000 COLONIES/mL  INSIGNIFICANT GROWTH Performed at [facility], [address], [place], [place] [address]    Report Status 06/17/2023 06/18/2023 FINAL   Final   Color, Urine 06/17/2023 YELLOW  YELLOW Final   APPearance 06/17/2023 HAZY (A)  CLEAR Final   Specific Gravity, Urine 06/17/2023 1.017  1.005 - 1.030 Final   pH 06/17/2023 6.0  5.0 - 8.0 Final   Glucose, UA 06/17/2023 NEGATIVE  NEGATIVE mg/dL Final   Hgb urine dipstick 06/17/2023 NEGATIVE  NEGATIVE Final   Bilirubin Urine 06/17/2023 NEGATIVE  NEGATIVE Final   Ketones, ur 06/17/2023 NEGATIVE  NEGATIVE mg/dL Final   Protein, ur 94/70/7974 NEGATIVE  NEGATIVE mg/dL Final   Nitrite 94/70/7974 NEGATIVE  NEGATIVE Final   Leukocytes,Ua 06/17/2023 LARGE (A)  NEGATIVE Final   RBC / HPF 06/17/2023 0-5  0 - 5 RBC/hpf Final   WBC, UA 06/17/2023 6-10  0 - 5 WBC/hpf Final   Bacteria, UA 06/17/2023 RARE (A)  NONE SEEN Final   Squamous Epithelial / HPF 06/17/2023 0-5  0 - 5 /HPF Final  Office Visit on 06/17/2023  Component Date Value Ref Range Status   Color, UA 06/17/2023 yellow  yellow Final   Clarity, UA 06/17/2023 clear  clear Final   Glucose, UA 06/17/2023 negative  negative mg/dL Final   Bilirubin, UA 94/70/7974 negative  negative Final   Ketones, POC UA 06/17/2023 negative  negative mg/dL Final   Spec Grav, UA 94/70/7974 1.015  1.010 - 1.025 Final   Blood, UA 06/17/2023 trace-intact (A)  negative Final   pH, UA 06/17/2023 5.5  5.0 - 8.0 Final   POC PROTEIN,UA 06/17/2023 negative  negative, trace Final   Urobilinogen, UA 06/17/2023 0.2  0.2 or 1.0 E.U./dL Final   Nitrite, UA 94/70/7974 Negative  Negative Final   Leukocytes, UA 06/17/2023 Small (1+) (A)  Negative Final  Office Visit on 06/08/2023  Component Date Value Ref Range Status   High risk HPV 06/08/2023 Negative   Final   Adequacy 06/08/2023 Satisfactory for evaluation; transformation zone component PRESENT.   Final   Diagnosis 06/08/2023 - Negative for intraepithelial lesion or  malignancy (NILM)   Final   Comment 06/08/2023 Normal Reference Range HPV - Negative   Final  Office Visit on 04/20/2023  Component Date Value Ref Range Status   Sodium 04/20/2023 139  135 - 145 mEq/L Final   Potassium 04/20/2023 4.3  3.5 - 5.1 mEq/L Final   Chloride 04/20/2023 105  96 - 112 mEq/L Final   CO2 04/20/2023 28  19 -  32 mEq/L Final   Glucose, Bld 04/20/2023 86  70 - 99 mg/dL Final   BUN 95/98/7974 14  6 - 23 mg/dL Final   Creatinine, Ser 04/20/2023 0.81  0.40 - 1.20 mg/dL Final   Total Bilirubin 04/20/2023 0.4  0.2 - 1.2 mg/dL Final   Alkaline Phosphatase 04/20/2023 72  39 - 117 U/L Final   AST 04/20/2023 21  0 - 37 U/L Final   ALT 04/20/2023 18  0 - 35 U/L Final   Total Protein 04/20/2023 7.2  6.0 - 8.3 g/dL Final   Albumin 95/98/7974 4.5  3.5 - 5.2 g/dL Final   GFR 95/98/7974 79.87  >60.00 mL/min Final   Calcium 04/20/2023 9.1  8.4 - 10.5 mg/dL Final   WBC 95/98/7974 4.6  4.0 - 10.5 K/uL Final   RBC 04/20/2023 4.75  3.87 - 5.11 Mil/uL Final   Hemoglobin 04/20/2023 15.4 (H)  12.0 - 15.0 g/dL Final   HCT 95/98/7974 46.5 (H)  36.0 - 46.0 % Final   MCV 04/20/2023 97.9  78.0 - 100.0 fl Final   MCHC 04/20/2023 33.1  30.0 - 36.0 g/dL Final   RDW 95/98/7974 13.1  11.5 - 15.5 % Final   Platelets 04/20/2023 250.0  150.0 - 400.0 K/uL Final   Neutrophils Relative % 04/20/2023 57.5  43.0 - 77.0 % Final   Lymphocytes Relative 04/20/2023 26.9  12.0 - 46.0 % Final   Monocytes Relative 04/20/2023 9.4  3.0 - 12.0 % Final   Eosinophils Relative 04/20/2023 4.6  0.0 - 5.0 % Final   Basophils Relative 04/20/2023 1.6  0.0 - 3.0 % Final   Neutro Abs 04/20/2023 2.7  1.4 - 7.7 K/uL Final   Lymphs Abs 04/20/2023 1.2  0.7 - 4.0 K/uL Final   Monocytes Absolute 04/20/2023 0.4  0.1 - 1.0 K/uL Final   Eosinophils Absolute 04/20/2023 0.2  0.0 - 0.7 K/uL Final   Basophils Absolute 04/20/2023 0.1  0.0 - 0.1 K/uL Final   Cholesterol 04/20/2023 200  0 - 200 mg/dL Final   Triglycerides 95/98/7974 44.0   0.0 - 149.0 mg/dL Final   HDL 95/98/7974 82.00  >39.00 mg/dL Final   VLDL 95/98/7974 8.8  0.0 - 59.9 mg/dL Final   LDL Cholesterol 04/20/2023 110 (H)  0 - 99 mg/dL Final   Total CHOL/HDL Ratio 04/20/2023 2   Final   NonHDL 04/20/2023 118.46   Final  Orders Only on 01/19/2023  Component Date Value Ref Range Status   SURGICAL PATHOLOGY 01/19/2023    Final-Edited                   Value:SURGICAL PATHOLOGY [facility] [address] [place], [place] [address] Telephone [phone] or [phone] Fax [phone]  REPORT OF SURGICAL PATHOLOGY   Accession #: [id number] Patient Name: MAEVEN, MCDOUGALL Visit # :   MRN: [id number] Physician: Karis Loa DOB/Age [birth year] (Age: 29) Gender: F Collected Date: 01/19/2023 Received Date: 01/19/2023  FINAL DIAGNOSIS       1. Sinus contents, bilateral :       -CHRONIC SINUSITIS.      -NO MALIGNANCY IDENTIFIED.       DATE SIGNED OUT: 01/21/2023 ELECTRONIC SIGNATURE : Marlan Md, Ronal Czar, Pathologist, Electronic Signature  MICROSCOPIC DESCRIPTION  CASE COMMENTS STAINS USED IN DIAGNOSIS: H&E    CLINICAL HISTORY  SPECIMEN(S) OBTAINED 1. Sinus contents, Bilateral  SPECIMEN COMMENTS: 1. Septoplasty, bilateral turbinate reduction, bilateral maxillary antrostomy, bilateral concha bullosa resection SPECIMEN CLINICAL  INFORMATION: 1. Deviated septum, bilat                         eral turbinate hypertrophy, chronic maxillary sinusitis, bilateral concha bullosa    Gross Description 1. Received in formalin is a 2.5 x 2.5 x 1.0 cm aggregate of tan-red soft tissue fragments and scant osteocartilaginous tissue fragments.  Representative tissue is submitted in block 1A.  (SB:kh 01/19/23)        Report signed out from the following location(s) [place] [address] [place], [place] [address] CLIA #: [id number]  [facility] [address]  [place], [place] [address] CLIA #: [id number]   Office Visit on 12/25/2022  Component Date Value Ref Range Status   SARS Coronavirus 2 Ag 12/25/2022 Negative  Negative Final  Appointment on 10/19/2022  Component Date Value Ref Range Status   WBC 10/19/2022 4.5  4.0 - 10.5 K/uL Final   RBC 10/19/2022 4.76  3.87 - 5.11 Mil/uL Final   Hemoglobin 10/19/2022 15.5 (H)  12.0 - 15.0 g/dL Final   HCT 90/69/7975 46.3 (H)  36.0 - 46.0 % Final   MCV 10/19/2022 97.4  78.0 - 100.0 fl Final   MCHC 10/19/2022 33.4  30.0 - 36.0 g/dL Final   RDW 90/69/7975 12.3  11.5 - 15.5 % Final   Platelets 10/19/2022 224.0  150.0 - 400.0 K/uL Final   Neutrophils Relative % 10/19/2022 49.6  43.0 - 77.0 % Final   Lymphocytes Relative 10/19/2022 29.5  12.0 - 46.0 % Final   Monocytes Relative 10/19/2022 8.0  3.0 - 12.0 % Final   Eosinophils Relative 10/19/2022 10.2 (H)  0.0 - 5.0 % Final   Basophils Relative 10/19/2022 2.7  0.0 - 3.0 % Final   Neutro Abs 10/19/2022 2.2  1.4 - 7.7 K/uL Final   Lymphs Abs 10/19/2022 1.3  0.7 - 4.0 K/uL Final   Monocytes Absolute 10/19/2022 0.4  0.1 - 1.0 K/uL Final   Eosinophils Absolute 10/19/2022 0.5  0.0 - 0.7 K/uL Final   Basophils Absolute 10/19/2022 0.1  0.0 - 0.1 K/uL Final   IgE (Immunoglobulin E), Serum 10/19/2022 8  <OR=114 kU/L Final  Office Visit on 09/30/2022  Component Date Value Ref Range Status   SARS Coronavirus 2 Ag 09/30/2022 Negative  Negative Final   Influenza A, POC 09/30/2022 Negative  Negative Final   Influenza B, POC 09/30/2022 Negative  Negative Final  There may be more visits with results that are not included.  No image results found. MM 3D DIAGNOSTIC MAMMOGRAM UNILATERAL RIGHT BREAST Result Date: 09/23/2023 CLINICAL DATA:  Recall from screening for a possible right breast asymmetry. EXAM: DIGITAL DIAGNOSTIC UNILATERAL RIGHT MAMMOGRAM WITH TOMOSYNTHESIS AND CAD TECHNIQUE: Right digital diagnostic mammography and breast tomosynthesis was performed. The images were  evaluated with computer-aided detection. COMPARISON:  Previous exam(s). ACR Breast Density Category b: There are scattered areas of fibroglandular density. FINDINGS: On spot compression imaging, the possible asymmetry noted in the upper right breast on the screening MLO view disperses consistent with superimposed normal fibroglandular tissue. A small stable intramammary lymph node is noted in this location, but there is no underlying suspicious mass, distortion or significant residual asymmetry. IMPRESSION: 1. No evidence of breast malignancy. RECOMMENDATION: Screening mammogram in one year.(Code:[id number]-B-01Y) I have discussed the findings and recommendations with the patient. If applicable, a reminder letter will be sent to the patient regarding the next appointment. BI-RADS CATEGORY  1: Negative. Electronically  Signed   By: Alm Parkins M.D.   On: 09/23/2023 11:30   MM 3D SCREENING MAMMOGRAM BILATERAL BREAST Result Date: 09/21/2023 CLINICAL DATA:  Screening. EXAM: DIGITAL SCREENING BILATERAL MAMMOGRAM WITH TOMOSYNTHESIS AND CAD TECHNIQUE: Bilateral screening digital craniocaudal and mediolateral oblique mammograms were obtained. Bilateral screening digital breast tomosynthesis was performed. The images were evaluated with computer-aided detection. COMPARISON:  Previous exam(s). ACR Breast Density Category b: There are scattered areas of fibroglandular density. FINDINGS: In the right breast, a possible mass-like asymmetry visible only on the MLO view warrants further evaluation. In the left breast, no findings suspicious for malignancy. IMPRESSION: Further evaluation is suggested for a possible asymmetry in the right breast. RECOMMENDATION: Diagnostic mammogram and possibly ultrasound of the right breast. (Code:[id number]) The patient will be contacted regarding the findings, and additional imaging will be scheduled. BI-RADS CATEGORY  0: Incomplete: Need additional imaging evaluation. Electronically Signed   By:  Debby Satterfield M.D.   On: 09/21/2023 14:55         ASSESSMENT & PLAN   Assessment & Plan Abdominal trauma, initial encounter Recurrent UTI RUQ pain Direct severe blunt force trauma with pain paradoxically worsening over a few days, suspicious for internal organ injury and possible internal bleeding.   She experiences right upper quadrant pain after leaning over a trash can, with no signs of internal bleeding or rib fracture. The differential includes liver contusion and muscle tear, with significant tenderness and inflammation present. Low blood pressure is noted but does not indicate severe internal bleeding. Liver contusion with possible bruising is suspected. No immediate need for CT scan or x-ray; ultrasound is preferred but not immediately available. Preoperative labs will assess blood counts and liver function. Order a right upper quadrant ultrasound to evaluate for hematoma, fracture, or muscle injury. Conduct blood work to check liver and kidney function, and blood counts. Check estrogen levels as requested. Monitor blood pressure before she leaves the clinic. Advise taking tramadol  for pain management as needed, with documentation for perioperative use. Instruct to avoid ibuprofen  and Aleve due to potential bleeding risk. Consider delaying surgery if significant issues are found in blood work or ultrasound results.  CT abdomen/pelvis was considered but she specifically stated she preferred not to seemed to be concerned with radiation, and she was more amenable for ultrasound evaluation to try to look for internal bleeding.  Given low blood pressure I advised patient to drink Gatorade to maintain blood pressure and ordered the testing as stat.   She reports her blood pressure is always on low side and reports no dizziness  Shared decision-making done; patient understood rationale and agreed to labwork mainly to rule out liver injury and look for blood loss as she has upcoming surgery next  week and we need to confirm safety for that as well as in general.    ORDER ASSOCIATIONS  #   DIAGNOSIS / CONDITION ICD-10 ENCOUNTER ORDER     ICD-10-CM   1. Abdominal trauma, initial encounter  S39.91XA CBC w/Diff    Comp Met (CMET)    traMADol  (ULTRAM ) 50 MG tablet    US  Abdomen Limited    2. Recurrent UTI  N39.0 CBC w/Diff    Comp Met (CMET)    Estrogens, Total    US  Abdomen Limited    CANCELED: US  ABDOMEN LIMITED RUQ (LIVER/GB)    3. RUQ pain  R10.11            Orders Placed in Encounter:  Lab Orders  CBC w/Diff         Comp Met (CMET)         Estrogens, Total    Imaging Orders         US  Abdomen Limited    Referral Orders  No referral(s) requested today   Meds ordered this encounter  Medications   traMADol  (ULTRAM ) 50 MG tablet    Sig: Take 1 tablet (50 mg total) by mouth every 8 (eight) hours as needed for up to 5 days.    Dispense:  15 tablet    Refill:  0    Orders Placed This Encounter  Procedures   US  Abdomen Limited    No auth needed    Standing Status:   Future    Expected Date:   10/29/2023    Expiration Date:   01/29/2024    Reason for Exam (SYMPTOM  OR DIAGNOSIS REQUIRED):   ruq pain after trauma eval for blood or free fluid in abdomen    Preferred imaging location?:   [facility]    Call Results- Best Contact Number?:   [phone] / do not hold   CBC w/Diff   Comp Met (CMET)   Estrogens, Total        This document was synthesized by artificial intelligence ([employer]bridge) using HIPAA-compliant recording of the clinical interaction;   We discussed the use of AI scribe software for clinical note transcription with the patient, who gave verbal consent to proceed. additional Info: This encounter employed state-of-the-art, real-time, collaborative documentation. The patient actively reviewed and assisted in updating their electronic medical record on a shared screen, ensuring transparency and facilitating joint problem-solving for the  problem list, overview, and plan. This approach promotes accurate, informed care. The treatment plan was discussed and reviewed in detail, including medication safety, potential side effects, and all patient questions. We confirmed understanding and comfort with the plan. Follow-up instructions were established, including contacting the office for any concerns, returning if symptoms worsen, persist, or new symptoms develop, and precautions for potential emergency department visits.

## 2023-10-29 NOTE — Telephone Encounter (Signed)
 FYI Only or Action Required?: FYI only for provider.  Patient was last seen in primary care on 10/29/2023 by Jesus Bernardino MATSU, MD.  Called Nurse Triage reporting Results.  Symptoms began No triage.  Interventions attempted: Other: no triage.  Symptoms are: no triage.  Triage Disposition: Call PCP Within 24 Hours  Patient/caregiver understands and will follow disposition?:     Copied from CRM #[id number]. Topic: Clinical - Lab/Test Results >> Oct 29, 2023  5:03 PM Drema MATSU wrote: Reason for CRM: Monica called to give STAT results. Reason for Disposition  Lab or radiology calling with test results  Answer Assessment - Initial Assessment Questions 1. REASON FOR CALL or QUESTION: What is your reason for calling today? or How can I best     Monica Ultrasound tech [phone] [facility]   Stat U/S abd 4 quadrants:   Limited study Read by Dr. Dwane  Results: Unremarkable limited abdominal ultrasound    This writer contacted on call provider Dr. Antonio and relayed ultrasound results.  Protocols used: PCP Call - No Triage-A-AH

## 2023-10-29 NOTE — Patient Instructions (Signed)
 It was a pleasure seeing you today! Your health and satisfaction are our top priorities.  Bernardino Cone, MD  VISIT SUMMARY: You came in today because of pain in your right upper abdomen after leaning over a trash can, which has worsened over the past few days. The pain is affecting your sleep and is made worse by coughing and certain movements. You have been taking Tylenol  for relief but have not yet used tramadol . You are also concerned about how this pain might affect your upcoming surgery.  YOUR PLAN: -LIVER CONTUSION WITH RIGHT UPPER QUADRANT PAIN AFTER BLUNT TRAUMA: A liver contusion is a bruise on the liver caused by blunt trauma. You have significant tenderness and inflammation in the area, but there are no signs of internal bleeding or rib fracture. We will order an ultrasound to check for any hematoma, fracture, or muscle injury, and conduct blood work to assess your liver and kidney function, as well as your blood counts. You should take tramadol  for pain management as needed and avoid ibuprofen  and Aleve due to the risk of bleeding. We will monitor your blood pressure before you leave the clinic. If significant issues are found in your blood work or ultrasound, we may need to consider delaying your surgery.  INSTRUCTIONS: Please follow up with the ultrasound and blood work as ordered. Take tramadol  for pain as needed and avoid ibuprofen  and Aleve. Monitor your blood pressure and report any significant changes. If the tests show any significant issues, we may need to delay your upcoming surgery.  Your Providers PCP: Katrinka Garnette KIDD, MD,  [phone]) Referring Provider: Katrinka Garnette KIDD, MD,  [phone]) Care Team Provider: Cheryn Nickels, MD,  [phone])  NEXT STEPS: [x]  Early Intervention: Schedule sooner appointment, call our on-call services, or go to emergency room if there is any significant Increase in pain or discomfort New or worsening symptoms Sudden or severe changes  in your health [x]  Flexible Follow-Up: We recommend a No follow-ups on file. for optimal routine care. This allows for progress monitoring and treatment adjustments. [x]  Preventive Care: Schedule your annual preventive care visit! It's typically covered by insurance and helps identify potential health issues early. [x]  Lab & X-ray Appointments: Incomplete tests scheduled today, or call to schedule. X-rays: [facility] (M-F, 8:30am-noon or 1pm-5pm). [x]  Medical Information Release: Sign a release form at front desk to obtain relevant medical information we don't have.  MAKING THE MOST OF OUR FOCUSED 20 MINUTE APPOINTMENTS: [x]   Clearly state your top concerns at the beginning of the visit to focus our discussion [x]   If you anticipate you will need more time, please inform the front desk during scheduling - we can book multiple appointments in the same week. [x]   If you have transportation problems- use our convenient video appointments or ask about transportation support. [x]   We can get down to business faster if you use MyChart to update information before the visit and submit non-urgent questions before your visit. Thank you for taking the time to provide details through MyChart.  Let our nurse know and she can import this information into your encounter documents.  Arrival and Wait Times: [x]   Arriving on time ensures that everyone receives prompt attention. [x]   Early morning (8a) and afternoon (1p) appointments tend to have shortest wait times. [x]   Unfortunately, we cannot delay appointments for late arrivals or hold slots during phone calls.  Getting Answers and Following Up [x]   Simple Questions & Concerns: For quick questions  or basic follow-up after your visit, reach us  at [phone] or MyChart messaging. [x]   Complex Concerns: If your concern is more complex, scheduling an appointment might be best. Discuss this with the staff to find the most suitable option. [x]    Lab & Imaging Results: We'll contact you directly if results are abnormal or you don't use MyChart. Most normal results will be on MyChart within 2-3 business days, with a review message from Dr. Jesus. Haven't heard back in 2 weeks? Need results sooner? Contact us  at [phone]. [x]   Referrals: Our referral coordinator will manage specialist referrals. The specialist's office should contact you within 2 weeks to schedule an appointment. Call us  if you haven't heard from them after 2 weeks.  Staying Connected [x]   MyChart: Activate your MyChart for the fastest way to access results and message us . See the last page of this paperwork for instructions on how to activate.  Bring to Your Next Appointment [x]   Medications: Please bring all your medication bottles to your next appointment to ensure we have an accurate record of your prescriptions. [x]   Health Diaries: If you're monitoring any health conditions at home, keeping a diary of your readings can be very helpful for discussions at your next appointment.  Billing [x]   X-ray & Lab Orders: These are billed by separate companies. Contact the invoicing company directly for questions or concerns. [x]   Visit Charges: Discuss any billing inquiries with our administrative services team.  Your Satisfaction Matters [x]   Share Your Experience: We strive for your satisfaction! If you have any complaints, or preferably compliments, please let Dr. Jesus know directly or contact our Practice Administrators, Manuelita Rubin or [employer], by asking at the front desk.   Reviewing Your Records [x]   Review this early draft of your clinical encounter notes below and the final encounter summary tomorrow on MyChart after its been completed.  All orders placed so far are visible here: Abdominal trauma, initial encounter -     CBC with Differential/Platelet -     Comprehensive metabolic panel with GFR -     traMADol  HCl; Take 1 tablet (50 mg total) by  mouth every 8 (eight) hours as needed for up to 5 days.  Dispense: 15 tablet; Refill: 0 -     US  Abdomen Limited; Future  Recurrent UTI -     CBC with Differential/Platelet -     Comprehensive metabolic panel with GFR -     Estrogens, total -     US  Abdomen Limited; Future  RUQ pain

## 2023-10-30 ENCOUNTER — Ambulatory Visit: Payer: Self-pay | Admitting: Internal Medicine

## 2023-11-02 NOTE — Telephone Encounter (Signed)
Spoke with patient. Advised of message as seen below from Dr.Miller. Patient verbalizes understanding. 

## 2023-11-03 ENCOUNTER — Ambulatory Visit

## 2023-11-03 ENCOUNTER — Other Ambulatory Visit (HOSPITAL_COMMUNITY)
Admission: RE | Admit: 2023-11-03 | Discharge: 2023-11-03 | Disposition: A | Discharge status: Home / Self Care | Source: Other Acute Inpatient Hospital | Attending: Obstetrics | Admitting: Obstetrics

## 2023-11-03 VITALS — BP 117/68 | HR 68 | Temp 97.7°F

## 2023-11-03 DIAGNOSIS — R82998 Other abnormal findings in urine: Secondary | ICD-10-CM | POA: Insufficient documentation

## 2023-11-03 DIAGNOSIS — R8290 Unspecified abnormal findings in urine: Secondary | ICD-10-CM | POA: Insufficient documentation

## 2023-11-03 DIAGNOSIS — R350 Frequency of micturition: Secondary | ICD-10-CM | POA: Diagnosis not present

## 2023-11-03 LAB — POCT URINALYSIS DIP (CLINITEK)
Bilirubin, UA: NEGATIVE
Blood, UA: NEGATIVE
Glucose, UA: NEGATIVE mg/dL
Ketones, POC UA: NEGATIVE mg/dL
Nitrite, UA: NEGATIVE
POC PROTEIN,UA: NEGATIVE
Spec Grav, UA: <=1.005 — AB (ref 1.010–1.025)
Urobilinogen, UA: 0.2 U/dL
pH, UA: 6 (ref 5.0–8.0)

## 2023-11-03 NOTE — Progress Notes (Signed)
 Kimberly Stevens arrived today with urinary frequency and urinary urgency. Patient is notexperiencing fever, unstable vitals and/or one-sided back flank pain. Patient has had a recent hospitalization due to UTI.  Last visit in the office was 10/21/2023.  Per protocol:   The most recent Urinalysis completed on 09-02-2023 and was notnormal.  Last Creatinine level  Lab Results  Component Value Date   CREATININE 0.88 10/29/2023    An urine specimen was collected and POCT urinalysis completed. [] A cath specimen was collected due to patient's current condition, symptoms or post-procedural state.  Total urine output by catheter is  Output by Drain (mL) 11/01/23 0701 - 11/01/23 1900 11/01/23 1901 - 11/02/23 0700 11/02/23 0701 - 11/02/23 1900 11/02/23 1901 - 11/03/23 0700 11/03/23 0701 - 11/03/23 1351  Patient has no LDAs of requested type attached.    Kimberly Stevens    POCT Urine results is not normal.  Urine micro was not sent per protocol for abnormal urinalysis.  Urine culture was sent per protocol for abnormal urinalysis.     [x] Pt was notified of positive urine results and plan for additional urine testing. We will contact you within the next 3-4 days with these results.  [] No Prescription was sent to your pharmacy.  The additional testing will indicate if a prescription is needed.   [] Patient was notified of abnormal urine results. The following prescription is sent to your preferred pharmacy.  []  Macrobid  100mg  #10 1 tablet by mouth twice daily with food for 5 days      []  Bactrim DS 800-160mg  #6 1 tablet by mouth twice daily for 3 days        []  Due to your current medication allergies, an alternate prescription was discussed with your provider and will be prescribed and sent to your pharmacy.  [x] You can take over the counter AZO two tablets up to three times a day for two days.  Take AZO tablets with a full glass of water. AZO will turn your urine orange, this is normal.   [] The patient was notified of  negative urine results.  If symptoms persist, you may take over the counter AZO two tablets up to three times a day for two days.  AZO will turn your urine orange, this is normal.  Contact the office back to schedule an appointment if your symptoms persist or worsen or you develop additional symptoms.   Patient states she has an ongoing prescription from Dr Cleotilde for macrodantin  and will take those as she is flying out of [place] later today.       CC'd note to patient's provider.

## 2023-11-03 NOTE — Patient Instructions (Signed)
 We have sent your urine out for additional testing.  Please call if you continue to experience persistent or worsening urinary symptoms such as fever > 100.4, nausea/vomiting, one sided back pain or blood in your urine.    It was a pleasure to see you today!  Thank you for trusting me with your care!

## 2023-11-04 LAB — ESTROGENS, TOTAL: Estrogen: 45 pg/mL

## 2023-11-05 ENCOUNTER — Other Ambulatory Visit: Payer: Self-pay | Admitting: Obstetrics and Gynecology

## 2023-11-05 LAB — URINE CULTURE: Culture: 30000 — AB

## 2023-11-05 MED ORDER — SULFAMETHOXAZOLE-TRIMETHOPRIM 800-160 MG PO TABS: 1.0000 | ORAL_TABLET | Freq: Two times a day (BID) | ORAL | 0 refills | Status: DC

## 2023-11-05 NOTE — Progress Notes (Signed)
 Patient took antibiotics to treat the UTI. Currently symptom free. Patient is requesting to discuss Percocet vs. Tramadol  with surgeon. She has picked up Tramadol  for her surgery on 10/21 but feels it may not be strong enough now after talking to other people. She does not want oxycodone  IR as she feels it is more constipating. Will confer with Dr. Guadlupe.

## 2023-11-08 ENCOUNTER — Encounter (HOSPITAL_COMMUNITY): Payer: Self-pay | Admitting: Obstetrics & Gynecology

## 2023-11-08 ENCOUNTER — Other Ambulatory Visit: Payer: Self-pay

## 2023-11-08 ENCOUNTER — Ambulatory Visit: Payer: Self-pay | Admitting: Obstetrics

## 2023-11-08 NOTE — Progress Notes (Signed)
 PCP - Katrinka Garnette KIDD, MD  Cardiologist -   PPM/ICD - denies Device Orders - n/a Rep Notified - n/a  Chest x-ray - denies EKG - denies Stress Test - denies ECHO - denies Cardiac Cath - denies  CPAP - denies  DM -denies  Blood Thinner Instructions: denies Aspirin  Instructions: denies  ERAS Protcol - clear liquids until 4:30 am.  COVID TEST- n/a  Anesthesia review: no, patient recently had UTI  Patient verbally denies any shortness of breath, fever, cough and chest pain during phone call   -------------  SDW INSTRUCTIONS given:  Your procedure is scheduled on November 09, 2023.  Report to [facility] Main Entrance A at 5:30 A.M., and check in at the Admitting office.  Call this number if you have problems the morning of surgery:  [phone]   Remember:  Do not eat after midnight the night before your surgery  You may drink clear liquids until 4:30 the morning of your surgery.   Clear liquids allowed are: Water, Non-Citrus Juices (without pulp), Carbonated Beverages, Clear Tea, Black Coffee Only, and Gatorade    Take these medicines the morning of surgery with A SIP OF WATER  acetaminophen  (TYLENOL )  Albuterol -Budesonide (AIRSUPRA )  fluticasone  (FLONASE )  pantoprazole  (PROTONIX )      As of today, STOP taking any Aspirin  (unless otherwise instructed by your surgeon) Aleve, Naproxen, Ibuprofen , Motrin , Advil , Goody's, BC's, all herbal medications, fish oil, and all vitamins.                      Do not wear jewelry, make up, or nail polish            Do not wear lotions, powders, perfumes/colognes, or deodorant.            Do not shave 48 hours prior to surgery.  Men may shave face and neck.            Do not bring valuables to the hospital.            [facility] is not responsible for any belongings or valuables.  Do NOT Smoke (Tobacco/Vaping) 24 hours prior to your procedure If you use a CPAP at night, you may bring all equipment for your overnight  stay.   Contacts, glasses, dentures or bridgework may not be worn into surgery.      For patients admitted to the hospital, discharge time will be determined by your treatment team.   Patients discharged the day of surgery will not be allowed to drive home, and someone needs to stay with them for 24 hours.    Special instructions:   [facility]- Preparing For Surgery  Before surgery, you can play an important role. Because skin is not sterile, your skin needs to be as free of germs as possible. You can reduce the number of germs on your skin by washing with CHG (chlorahexidine gluconate) Soap before surgery.  CHG is an antiseptic cleaner which kills germs and bonds with the skin to continue killing germs even after washing.    Oral Hygiene is also important to reduce your risk of infection.  Remember - BRUSH YOUR TEETH THE MORNING OF SURGERY WITH YOUR REGULAR TOOTHPASTE  Please do not use if you have an allergy to CHG or antibacterial soaps. If your skin becomes reddened/irritated stop using the CHG.  Do not shave (including legs and underarms) for at least 48 hours prior to first CHG shower. It is OK to shave  your face.  Please follow these instructions carefully.   Shower the NIGHT BEFORE SURGERY and the MORNING OF SURGERY with DIAL Soap.   Pat yourself dry with a CLEAN TOWEL.  Wear CLEAN PAJAMAS to bed the night before surgery  Place CLEAN SHEETS on your bed the night of your first shower and DO NOT SLEEP WITH PETS.   Day of Surgery: Please shower morning of surgery  Wear Clean/Comfortable clothing the morning of surgery Do not apply any deodorants/lotions.   Remember to brush your teeth WITH YOUR REGULAR TOOTHPASTE.   Questions were answered. Patient verbalized understanding of instructions.

## 2023-11-09 ENCOUNTER — Ambulatory Visit (HOSPITAL_COMMUNITY): Admitting: Anesthesiology

## 2023-11-09 ENCOUNTER — Telehealth: Payer: Self-pay | Admitting: *Deleted

## 2023-11-09 ENCOUNTER — Ambulatory Visit (HOSPITAL_COMMUNITY)
Admission: RE | Admit: 2023-11-09 | Discharge: 2023-11-09 | Disposition: A | Discharge status: Home / Self Care | Attending: Obstetrics & Gynecology | Admitting: Obstetrics & Gynecology

## 2023-11-09 ENCOUNTER — Other Ambulatory Visit (HOSPITAL_BASED_OUTPATIENT_CLINIC_OR_DEPARTMENT_OTHER): Payer: Self-pay | Admitting: Obstetrics & Gynecology

## 2023-11-09 ENCOUNTER — Other Ambulatory Visit (HOSPITAL_COMMUNITY): Payer: Self-pay

## 2023-11-09 ENCOUNTER — Telehealth: Payer: Self-pay | Admitting: Obstetrics

## 2023-11-09 ENCOUNTER — Encounter (HOSPITAL_COMMUNITY)
Admission: RE | Disposition: A | Discharge status: Home / Self Care | Payer: Self-pay | Source: Home / Self Care | Attending: Obstetrics & Gynecology

## 2023-11-09 DIAGNOSIS — N393 Stress incontinence (female) (male): Secondary | ICD-10-CM | POA: Diagnosis present

## 2023-11-09 DIAGNOSIS — N812 Incomplete uterovaginal prolapse: Secondary | ICD-10-CM | POA: Diagnosis not present

## 2023-11-09 DIAGNOSIS — N83201 Unspecified ovarian cyst, right side: Secondary | ICD-10-CM

## 2023-11-09 DIAGNOSIS — D270 Benign neoplasm of right ovary: Secondary | ICD-10-CM | POA: Insufficient documentation

## 2023-11-09 DIAGNOSIS — N838 Other noninflammatory disorders of ovary, fallopian tube and broad ligament: Secondary | ICD-10-CM | POA: Insufficient documentation

## 2023-11-09 DIAGNOSIS — N83291 Other ovarian cyst, right side: Secondary | ICD-10-CM

## 2023-11-09 DIAGNOSIS — N8110 Cystocele, unspecified: Secondary | ICD-10-CM | POA: Diagnosis present

## 2023-11-09 DIAGNOSIS — S0502XA Injury of conjunctiva and corneal abrasion without foreign body, left eye, initial encounter: Secondary | ICD-10-CM

## 2023-11-09 HISTORY — DX: Other complications of anesthesia, initial encounter: T88.59XA

## 2023-11-09 HISTORY — PX: LAPAROSCOPIC SALPINGO OOPHERECTOMY: SHX5927

## 2023-11-09 HISTORY — PX: ANTERIOR AND POSTERIOR REPAIR: SHX5121

## 2023-11-09 HISTORY — PX: PERINEOPLASTY: SHX2218

## 2023-11-09 HISTORY — PX: CYSTOSCOPY: SHX5120

## 2023-11-09 HISTORY — PX: BLADDER SUSPENSION: SHX72

## 2023-11-09 LAB — CBC
HCT: 44.5 % (ref 36.0–46.0)
Hemoglobin: 15.1 g/dL — ABNORMAL HIGH (ref 12.0–15.0)
MCH: 32.5 pg (ref 26.0–34.0)
MCHC: 33.9 g/dL (ref 30.0–36.0)
MCV: 95.9 fL (ref 80.0–100.0)
Platelets: 242 K/uL (ref 150–400)
RBC: 4.64 MIL/uL (ref 3.87–5.11)
RDW: 12.2 % (ref 11.5–15.5)
WBC: 6.4 K/uL (ref 4.0–10.5)
nRBC: 0 % (ref 0.0–0.2)

## 2023-11-09 LAB — ABO/RH: ABO/RH(D): O POS

## 2023-11-09 LAB — TYPE AND SCREEN
ABO/RH(D): O POS
Antibody Screen: NEGATIVE

## 2023-11-09 SURGERY — SALPINGO-OOPHORECTOMY, LAPAROSCOPIC
Anesthesia: General | Site: Perineum

## 2023-11-09 MED ORDER — SUGAMMADEX SODIUM 200 MG/2ML IV SOLN
INTRAVENOUS | Status: DC | PRN
  Administered 2023-11-09: 200 mg via INTRAVENOUS

## 2023-11-09 MED ORDER — GLYCOPYRROLATE PF 0.2 MG/ML IJ SOSY
PREFILLED_SYRINGE | INTRAMUSCULAR | Status: DC | PRN
  Administered 2023-11-09: .2 mg via INTRAVENOUS

## 2023-11-09 MED ORDER — SODIUM CHLORIDE 0.9 % IR SOLN
Status: DC | PRN
  Administered 2023-11-09: 1000 mL

## 2023-11-09 MED ORDER — LACTATED RINGERS IV SOLN: INTRAVENOUS | Status: DC | PRN

## 2023-11-09 MED ORDER — ONDANSETRON HCL 4 MG/2ML IJ SOLN
INTRAMUSCULAR | Status: DC | PRN
  Administered 2023-11-09: 4 mg via INTRAVENOUS

## 2023-11-09 MED ORDER — LACTATED RINGERS IV SOLN
INTRAVENOUS | Status: DC
Start: 2023-11-09 — End: 2023-11-09

## 2023-11-09 MED ORDER — PHENAZOPYRIDINE HCL 100 MG PO TABS
200.0000 mg | ORAL_TABLET | ORAL | Status: AC
  Administered 2023-11-09: 200 mg via ORAL
  Filled 2023-11-09: qty 2

## 2023-11-09 MED ORDER — OXYCODONE HCL 5 MG/5ML PO SOLN
5.0000 mg | Freq: Once | ORAL | Status: AC | PRN
  Administered 2023-11-09: 5 mg via ORAL

## 2023-11-09 MED ORDER — PROPOFOL 10 MG/ML IV BOLUS
INTRAVENOUS | Status: AC
Start: 2023-11-09 — End: 2023-11-09
  Filled 2023-11-09: qty 20

## 2023-11-09 MED ORDER — POVIDONE-IODINE 10 % EX SWAB
2.0000 | Freq: Once | CUTANEOUS | Status: AC
  Administered 2023-11-09: 2 via TOPICAL

## 2023-11-09 MED ORDER — LIDOCAINE-EPINEPHRINE 1 %-1:100000 IJ SOLN
INTRAMUSCULAR | Status: AC
  Filled 2023-11-09: qty 1

## 2023-11-09 MED ORDER — PHENYLEPHRINE 80 MCG/ML (10ML) SYRINGE FOR IV PUSH (FOR BLOOD PRESSURE SUPPORT)
PREFILLED_SYRINGE | INTRAVENOUS | Status: DC | PRN
  Administered 2023-11-09: 80 ug via INTRAVENOUS

## 2023-11-09 MED ORDER — ORAL CARE MOUTH RINSE: 15.0000 mL | Freq: Once | OROMUCOSAL | Status: AC

## 2023-11-09 MED ORDER — 0.9 % SODIUM CHLORIDE (POUR BTL) OPTIME
TOPICAL | Status: DC | PRN
  Administered 2023-11-09: 1000 mL

## 2023-11-09 MED ORDER — BUPIVACAINE HCL (PF) 0.5 % IJ SOLN
INTRAMUSCULAR | Status: AC
Start: 2023-11-09 — End: 2023-11-09
  Filled 2023-11-09: qty 30

## 2023-11-09 MED ORDER — PHENYLEPHRINE HCL-NACL 20-0.9 MG/250ML-% IV SOLN
INTRAVENOUS | Status: DC | PRN
  Administered 2023-11-09: 20 ug/min via INTRAVENOUS

## 2023-11-09 MED ORDER — BUPIVACAINE HCL (PF) 0.25 % IJ SOLN
INTRAMUSCULAR | Status: AC
Start: 2023-11-09 — End: 2023-11-09
  Filled 2023-11-09: qty 30

## 2023-11-09 MED ORDER — BUPIVACAINE HCL (PF) 0.25 % IJ SOLN
INTRAMUSCULAR | Status: DC | PRN
  Administered 2023-11-09: 9 mL

## 2023-11-09 MED ORDER — ROCURONIUM BROMIDE 10 MG/ML (PF) SYRINGE
PREFILLED_SYRINGE | INTRAVENOUS | Status: DC | PRN
  Administered 2023-11-09: 25 mg via INTRAVENOUS
  Administered 2023-11-09: 10 mg via INTRAVENOUS
  Administered 2023-11-09: 50 mg via INTRAVENOUS
  Administered 2023-11-09 (×2): 10 mg via INTRAVENOUS

## 2023-11-09 MED ORDER — LIDOCAINE 2% (20 MG/ML) 5 ML SYRINGE
INTRAMUSCULAR | Status: DC | PRN
  Administered 2023-11-09: 100 mg via INTRAVENOUS

## 2023-11-09 MED ORDER — SODIUM CHLORIDE (PF) 0.9 % IJ SOLN
INTRAMUSCULAR | Status: DC | PRN
  Administered 2023-11-09: 10 mL

## 2023-11-09 MED ORDER — OXYCODONE HCL 5 MG PO TABS
5.0000 mg | ORAL_TABLET | ORAL | 0 refills | Status: DC | PRN
  Filled 2023-11-09: qty 5, 1d supply, fill #0

## 2023-11-09 MED ORDER — GABAPENTIN 300 MG PO CAPS
300.0000 mg | ORAL_CAPSULE | ORAL | Status: AC
Start: 2023-11-09 — End: 2023-11-09
  Administered 2023-11-09: 300 mg via ORAL
  Filled 2023-11-09: qty 1

## 2023-11-09 MED ORDER — BSS IO SOLN: 15.0000 mL | Freq: Once | INTRAOCULAR | Status: DC

## 2023-11-09 MED ORDER — KETOROLAC TROMETHAMINE 30 MG/ML IJ SOLN
INTRAMUSCULAR | Status: DC | PRN
  Administered 2023-11-09: 30 mg via INTRAVENOUS

## 2023-11-09 MED ORDER — OXYCODONE HCL 5 MG/5ML PO SOLN
ORAL | Status: AC
  Filled 2023-11-09: qty 5

## 2023-11-09 MED ORDER — MIDAZOLAM HCL (PF) 2 MG/2ML IJ SOLN
INTRAMUSCULAR | Status: DC | PRN
Start: 2023-11-09 — End: 2023-11-09
  Administered 2023-11-09: 1 mg via INTRAVENOUS

## 2023-11-09 MED ORDER — DEXAMETHASONE SOD PHOSPHATE PF 10 MG/ML IJ SOLN
INTRAMUSCULAR | Status: DC | PRN
  Administered 2023-11-09: 10 mg via INTRAVENOUS

## 2023-11-09 MED ORDER — TETRACAINE HCL 0.5 % OP SOLN
OPHTHALMIC | Status: AC
  Filled 2023-11-09: qty 4

## 2023-11-09 MED ORDER — CHLORHEXIDINE GLUCONATE 0.12 % MT SOLN
15.0000 mL | Freq: Once | OROMUCOSAL | Status: AC
  Administered 2023-11-09: 15 mL via OROMUCOSAL
  Filled 2023-11-09: qty 15

## 2023-11-09 MED ORDER — FENTANYL CITRATE (PF) 250 MCG/5ML IJ SOLN
INTRAMUSCULAR | Status: DC | PRN
  Administered 2023-11-09: 25 ug via INTRAVENOUS
  Administered 2023-11-09: 100 ug via INTRAVENOUS
  Administered 2023-11-09 (×2): 25 ug via INTRAVENOUS
  Administered 2023-11-09: 50 ug via INTRAVENOUS
  Administered 2023-11-09: 25 ug via INTRAVENOUS

## 2023-11-09 MED ORDER — FENTANYL CITRATE (PF) 250 MCG/5ML IJ SOLN
INTRAMUSCULAR | Status: AC
  Filled 2023-11-09: qty 5

## 2023-11-09 MED ORDER — TETRACAINE HCL 0.5 % OP SOLN
1.0000 [drp] | Freq: Once | OPHTHALMIC | Status: AC
  Administered 2023-11-09: 1 [drp] via OPHTHALMIC

## 2023-11-09 MED ORDER — SODIUM CHLORIDE (PF) 0.9 % IJ SOLN
INTRAMUSCULAR | Status: AC
  Filled 2023-11-09: qty 10

## 2023-11-09 MED ORDER — MIDAZOLAM HCL 2 MG/2ML IJ SOLN
INTRAMUSCULAR | Status: AC
  Filled 2023-11-09: qty 2

## 2023-11-09 MED ORDER — SODIUM CHLORIDE (PF) 0.9 % IJ SOLN
INTRAMUSCULAR | Status: AC
  Filled 2023-11-09: qty 50

## 2023-11-09 MED ORDER — OXYCODONE HCL 5 MG PO TABS: 5.0000 mg | ORAL_TABLET | Freq: Once | ORAL | Status: AC | PRN

## 2023-11-09 MED ORDER — PROPOFOL 1000 MG/100ML IV EMUL
INTRAVENOUS | Status: AC
  Filled 2023-11-09: qty 100

## 2023-11-09 MED ORDER — FENTANYL CITRATE (PF) 100 MCG/2ML IJ SOLN: 25.0000 ug | INTRAMUSCULAR | Status: DC | PRN

## 2023-11-09 MED ORDER — DROPERIDOL 2.5 MG/ML IJ SOLN: 0.6250 mg | Freq: Once | INTRAMUSCULAR | Status: DC | PRN

## 2023-11-09 MED ORDER — ACETAMINOPHEN 500 MG PO TABS
1000.0000 mg | ORAL_TABLET | ORAL | Status: AC
  Administered 2023-11-09: 1000 mg via ORAL
  Filled 2023-11-09: qty 2

## 2023-11-09 MED ORDER — ERYTHROMYCIN 5 MG/GM OP OINT: 1.0000 | TOPICAL_OINTMENT | Freq: Every day | OPHTHALMIC | 0 refills | Status: DC

## 2023-11-09 MED ORDER — PROPOFOL 10 MG/ML IV BOLUS
INTRAVENOUS | Status: AC
  Filled 2023-11-09: qty 20

## 2023-11-09 MED ORDER — CEFAZOLIN SODIUM-DEXTROSE 2-4 GM/100ML-% IV SOLN
2.0000 g | INTRAVENOUS | Status: AC
  Administered 2023-11-09: 2 g via INTRAVENOUS
  Filled 2023-11-09: qty 100

## 2023-11-09 MED ORDER — PROPOFOL 500 MG/50ML IV EMUL
INTRAVENOUS | Status: DC | PRN
  Administered 2023-11-09: 80 ug/kg/min via INTRAVENOUS

## 2023-11-09 MED ORDER — NITROFURANTOIN MONOHYD MACRO 100 MG PO CAPS: 100.0000 mg | ORAL_CAPSULE | Freq: Two times a day (BID) | ORAL | Status: DC

## 2023-11-09 SURGICAL SUPPLY — 73 items
BAG COUNTER SPONGE SURGICOUNT (BAG) ×3 IMPLANT
BLADE CLIPPER SENSICLIP SURGIC (BLADE) ×3 IMPLANT
BLADE SURG 15 STRL LF DISP TIS (BLADE) ×3 IMPLANT
CNTNR URN SCR LID CUP LEK RST (MISCELLANEOUS) IMPLANT
COVER SURGICAL LIGHT HANDLE (MISCELLANEOUS) IMPLANT
DERMABOND ADVANCED .7 DNX12 (GAUZE/BANDAGES/DRESSINGS) ×3 IMPLANT
DILATOR CANAL MILEX (MISCELLANEOUS) IMPLANT
DRAPE SURG IRRIG POUCH 19X23 (DRAPES) ×3 IMPLANT
DURAPREP 26ML APPLICATOR (WOUND CARE) ×3 IMPLANT
GAUZE 4X4 16PLY ~~LOC~~+RFID DBL (SPONGE) IMPLANT
GLOVE BIO SURGEON STRL SZ 6.5 (GLOVE) ×3 IMPLANT
GLOVE BIOGEL PI IND STRL 6 (GLOVE) ×3 IMPLANT
GLOVE BIOGEL PI IND STRL 6.5 (GLOVE) ×3 IMPLANT
GLOVE BIOGEL PI IND STRL 7.0 (GLOVE) ×12 IMPLANT
GLOVE BIOGEL PI MICRO STRL 5.5 (GLOVE) ×3 IMPLANT
GLOVE ECLIPSE 6.5 STRL STRAW (GLOVE) ×6 IMPLANT
GLOVE NEODERM STER SZ 7 (GLOVE) IMPLANT
GLOVE SS PI 5.5 STRL (GLOVE) ×3 IMPLANT
GOWN STRL REUS W/ TWL LRG LVL3 (GOWN DISPOSABLE) ×12 IMPLANT
GOWN STRL REUS W/ TWL XL LVL3 (GOWN DISPOSABLE) ×3 IMPLANT
HIBICLENS CHG 4% 4OZ BTL (MISCELLANEOUS) ×3 IMPLANT
HOLDER FOLEY CATH W/STRAP (MISCELLANEOUS) ×3 IMPLANT
IRRIGATION SUCT STRKRFLW 2 WTP (MISCELLANEOUS) IMPLANT
KIT PINK PAD W/HEAD ARM REST (MISCELLANEOUS) ×3 IMPLANT
KIT TURNOVER KIT B (KITS) ×3 IMPLANT
LIGASURE VESSEL 5MM BLUNT TIP (ELECTROSURGICAL) IMPLANT
NDL HYPO 18GX1.5 BLUNT FILL (NEEDLE) IMPLANT
NDL HYPO 22X1.5 SAFETY MO (MISCELLANEOUS) ×3 IMPLANT
NDL INSUFFLATION 14GA 120MM (NEEDLE) ×3 IMPLANT
NDL SPNL 22GX3.5 QUINCKE BK (NEEDLE) IMPLANT
NEEDLE HYPO 18GX1.5 BLUNT FILL (NEEDLE) ×2 IMPLANT
NEEDLE HYPO 22X1.5 SAFETY MO (MISCELLANEOUS) ×2 IMPLANT
NEEDLE INSUFFLATION 14GA 120MM (NEEDLE) ×2 IMPLANT
NEEDLE SPNL 22GX3.5 QUINCKE BK (NEEDLE) IMPLANT
PACK LAPAROSCOPY BASIN (CUSTOM PROCEDURE TRAY) ×3 IMPLANT
PACK VAGINAL WOMENS (CUSTOM PROCEDURE TRAY) ×3 IMPLANT
POUCH LAPAROSCOPIC INSTRUMENT (MISCELLANEOUS) ×3 IMPLANT
RETRACTOR LONE STAR DISPOSABLE (INSTRUMENTS) ×3 IMPLANT
RETRACTOR STAY HOOK 5MM (MISCELLANEOUS) ×3 IMPLANT
SET CYSTO IRRIGATION (SET/KITS/TRAYS/PACK) ×3 IMPLANT
SET IRRIG Y TYPE TUR BLADDER L (SET/KITS/TRAYS/PACK) ×3 IMPLANT
SET TUBE SMOKE EVAC HIGH FLOW (TUBING) IMPLANT
SHEARS HARMONIC 36 ACE (MISCELLANEOUS) IMPLANT
SLEEVE ADV FIXATION 5X100MM (TROCAR) IMPLANT
SLEEVE SCD COMPRESS KNEE MED (STOCKING) ×3 IMPLANT
SLING ADVANTAGE FIT TRANVAG (Sling) IMPLANT
SOLN 0.9% NACL POUR BTL 1000ML (IV SOLUTION) ×3 IMPLANT
SPECIMEN JAR MEDIUM (MISCELLANEOUS) IMPLANT
SPIKE FLUID TRANSFER (MISCELLANEOUS) ×3 IMPLANT
SUCTION TUBE FRAZIER 10FR DISP (SUCTIONS) ×3 IMPLANT
SURGIFLO W/THROMBIN 8M KIT (HEMOSTASIS) IMPLANT
SUT MON AB 2-0 SH27 (SUTURE) IMPLANT
SUT PDS AB 2-0 CT2 27 (SUTURE) IMPLANT
SUT VIC AB 0 CT1 27XBRD ANBCTR (SUTURE) IMPLANT
SUT VIC AB 2-0 CT1 (SUTURE) ×6 IMPLANT
SUT VIC AB 2-0 SH 27XBRD (SUTURE) ×6 IMPLANT
SUT VIC AB 3-0 SH 18 (SUTURE) IMPLANT
SUT VIC AB 4-0 PS2 18 (SUTURE) ×3 IMPLANT
SUT VICRYL 0 UR6 27IN ABS (SUTURE) IMPLANT
SUT VICRYL 2-0 SH 8X27 (SUTURE) IMPLANT
SYR 30ML LL (SYRINGE) IMPLANT
SYR 50ML SLIP (SYRINGE) IMPLANT
SYR BULB EAR ULCER 3OZ GRN STR (SYRINGE) ×3 IMPLANT
SYSTEM BAG RETRIEVAL 10MM (BASKET) IMPLANT
SYSTEM CARTER THOMASON II (TROCAR) IMPLANT
TOWEL GREEN STERILE (TOWEL DISPOSABLE) ×3 IMPLANT
TOWEL GREEN STERILE FF (TOWEL DISPOSABLE) ×6 IMPLANT
TRAP SPECIMEN MUCUS 40CC (MISCELLANEOUS) IMPLANT
TRAY FOLEY W/BAG SLVR 14FR (SET/KITS/TRAYS/PACK) ×3 IMPLANT
TROCAR 11X100 Z THREAD (TROCAR) IMPLANT
TROCAR ADV FIXATION 5X100MM (TROCAR) ×3 IMPLANT
TROCAR XCEL NON-BLD 5MMX100MML (ENDOMECHANICALS) ×6 IMPLANT
WARMER LAPAROSCOPE (MISCELLANEOUS) ×3 IMPLANT

## 2023-11-09 NOTE — Progress Notes (Signed)
 Pt had UTI on Friday. States she had her prescribed abx on Friday and Saturday. No symptoms today. Per Dr. Cleotilde and dr. Guadlupe no U/A needed.

## 2023-11-09 NOTE — Transfer of Care (Signed)
 Immediate Anesthesia Transfer of Care Note  Patient: Kimberly Stevens  Procedure(s) Performed: SALPINGO-OOPHORECTOMY, LAPAROSCOPIC (Bilateral: Abdomen) CREATION, URETHRAL SLING, RETROPUBIC APPROACH, USING POLYPROPYLENE TAPE (Perineum) ANTERIOR (CYSTOCELE) AND POSTERIOR REPAIR (RECTOCELE) (Perineum) CYSTOSCOPY (Perineum) PERINEOPLASTY (Perineum)  Patient Location: PACU  Anesthesia Type:General  Level of Consciousness: awake  Airway & Oxygen Therapy: Patient Spontanous Breathing  Post-op Assessment: Report given to RN  Post vital signs: Reviewed  Last Vitals:  Vitals Value Taken Time  BP 118/66 11/09/23 10:15  Temp 36.6 C 11/09/23 10:10  Pulse 67 11/09/23 10:18  Resp 12 11/09/23 10:18  SpO2 92 % 11/09/23 10:18  Vitals shown include unfiled device data.  Last Pain:  Vitals:   11/09/23 1010  TempSrc:   PainSc: Asleep         Complications: No notable events documented.

## 2023-11-09 NOTE — Anesthesia Postprocedure Evaluation (Signed)
 Anesthesia Post Note  Patient: Kimberly Stevens  Procedure(s) Performed: SALPINGO-OOPHORECTOMY, LAPAROSCOPIC (Bilateral: Abdomen) CREATION, URETHRAL SLING, RETROPUBIC APPROACH, USING POLYPROPYLENE TAPE (Perineum) ANTERIOR (CYSTOCELE) AND POSTERIOR REPAIR (RECTOCELE) (Perineum) CYSTOSCOPY (Perineum) PERINEOPLASTY (Perineum)     Patient location during evaluation: PACU Anesthesia Type: General Level of consciousness: awake and alert Pain management: pain level controlled Vital Signs Assessment: post-procedure vital signs reviewed and stable Respiratory status: spontaneous breathing, nonlabored ventilation, respiratory function stable and patient connected to nasal cannula oxygen Cardiovascular status: blood pressure returned to baseline and stable Postop Assessment: no apparent nausea or vomiting Anesthetic complications: yes Comments: Called to PACU for complaints of left eye pain.  Patient underwent general endotracheal anesthetic for laparoscopic surgery and now complains of feeling of foreign body sensation in left eye, exacerbated by eye movement and opening. Denies vision changes. Does not wear contacts.  Advised patient the symptoms are consistent with a corneal abrasion and that the vast majority of these improve without intervention with time. I have ordered one drop of tetracaine  ophthalmic into affected eye, one time, for symptomatic relief, and advised lubricating eye drops regularly as needed. If no improvement noticed in 24-48 hrs, advised patient to let the medical team know to contact anesthesiology department for potential ophthalmology consult.    Encounter Notable Events  Notable Event Outcome Phase Comment  Corneal abrasion  Postprocedure, before discharge left eye    Last Vitals:  Vitals:   11/09/23 1040 11/09/23 1045  BP:  130/73  Pulse: 67 65  Resp: 12 16  Temp: 36.6 C   SpO2: 92% 91%    Last Pain:  Vitals:   11/09/23 1040  TempSrc:   PainSc:  Asleep                 Rome Ade

## 2023-11-09 NOTE — Telephone Encounter (Signed)
 Evaluated by Dr. Boone for left corneal abrasion in PACU and provided tetracaine  ophthalmic drop and recommended lubricating eye drops as needed.  If no improvement in 24-28 hours, recommended ophthalmology consult.  Does not wear contacts.   Called regarding eye discomfort. Advised gentle warm compress and to monitor symptoms.  Rx erythromycin  ointment at bedtime for 4 days or discontinue when symptoms improve.  Seek urgent evaluation at ED if symptoms of swelling or pain worsens or decreased visual acuity, will need ophthalmology consult.

## 2023-11-09 NOTE — Discharge Instructions (Signed)

## 2023-11-09 NOTE — Progress Notes (Signed)
 Instilled 300mls of sterile water into foley, patient voided once foley was removed, bladder scan shows . Called and left message for Dr. Guadlupe.  EFFIE POWELL HERO, RN

## 2023-11-09 NOTE — H&P (Signed)
 Kimberly Stevens is an 58 y.o. female  G2P2 with hx of 3cm right dermoid cyst that was previously followed by Dr. Velinda Organ prior to his retirement.  I started seeing her at that time.  The dermoid has been followed with ultrasound and is stable in size but we have discussed surgical removal several times.  Once she decided to proceed with treatment of her urinary incontinence decided to proceed with this at the same time.  Procedure, risks and benefits have all been discussed with patient and are documented in her office note on 10/18/2023.  Most recent ultrasound was 06/16/2023 showing uterus measuring 4.9 x 4.2 x 2.3cm.  Right ovary with 2.3 x 2.7cm dermoid.  Left ovary normal.   Pertinent Gynecological History: Menses: post-menopausal Bleeding: none Contraception: post menopausal status DES exposure: denies Blood transfusions: no hx Sexually transmitted diseases: no past history Previous GYN Procedures: NSVD x 2  Last mammogram: normal Date: 09/23/2022 Last pap: normal Date: 06/08/2023 OB History: G2, P2   Menstrual History: Patient's last menstrual period was 06/20/2017 (approximate).    Past Medical History:  Diagnosis Date   Allergy    seasonal allergies   Arthritis    left great toe joint   Broken arm    right elbow & wrist   Colon polyps    benign   Complication of anesthesia    per patient she was hard to wake up from previous surgery   Depression    hx of post partum   Dermoid cyst of ovary    Eye disorder    possible glaucoma-being followed by Dr Carmelia   Fibrocystic breast    IBS (irritable bowel syndrome)    Plantar fasciitis    left foot 2/15-11/15   Polyp of duodenum 2014   tiny   Tendonitis    right knee 2/15-11/15   Torn rotator cuff    right    Past Surgical History:  Procedure Laterality Date   BROW LIFT Bilateral 02/22/2020   Procedure: BLEPHAROPLASTY UPPER EYELID; W/EXCESS SKIN BILATERAL;  Surgeon: Ashley Greig HERO, MD;  Location[facility];   Service: Ophthalmology;  Laterality: Bilateral;   COLONOSCOPY  2014   JP-MAC-movi(exc)-poylpoid   NASAL SINUS SURGERY  01/19/2023   POLYPECTOMY  2014   polypoid   WISDOM TOOTH EXTRACTION      Family History  Problem Relation Age of Onset   Arthritis Mother        27 in 2022. shoulder, knee surgery.   Multiple myeloma Father        36 in 2022. around mid 60s.   Diverticulitis Father    Hearing loss Father        and poor vision   Hepatitis Father        B she believes   Macular degeneration Father    Leukemia Father    Conductive hearing loss Father    Healthy Sister    Healthy Sister    Leukemia Maternal Uncle    Arthritis Maternal Grandmother    Heart attack Maternal Grandmother        age 59   Arthritis Paternal Grandmother    Colon cancer Neg Hx    Colon polyps Neg Hx    Liver disease Neg Hx    Pancreatic cancer Neg Hx    Esophageal cancer Neg Hx    Stomach cancer Neg Hx    Rectal cancer Neg Hx    Breast cancer Neg Hx  Social History:  reports that she has never smoked. She has never been exposed to tobacco smoke. She has never used smokeless tobacco. She reports current alcohol use of about 4.0 standard drinks of alcohol per week. She reports that she does not use drugs.  Allergies:  Allergies  Allergen Reactions   Gluten Meal Diarrhea   Lactose Intolerance (Gi)     Medications Prior to Admission  Medication Sig Dispense Refill Last Dose/Taking   benzonatate  (TESSALON ) 100 MG capsule Take 2 capsules (200 mg total) by mouth every 12 (twelve) hours as needed for cough. (Patient taking differently: Take 200 mg by mouth at bedtime.) 120 capsule 0 11/08/2023   chlorpheniramine (CHLOR-TRIMETON) 4 MG tablet Take 4 mg by mouth every 6 (six) hours as needed for allergies.   11/08/2023   cholecalciferol (VITAMIN D3) 25 MCG (1000 UNIT) tablet Take 1,000 Units by mouth daily.   11/07/2023   estradiol  (ESTRACE ) 0.1 MG/GM vaginal cream Place 0.5 g vaginally 2 (two)  times a week. Place 0.5g nightly for two weeks then twice a week after 30 g 3 Taking   famotidine (PEPCID) 10 MG tablet Take 10 mg by mouth at bedtime.   11/08/2023   fluticasone  (FLONASE ) 50 MCG/ACT nasal spray Place 2 sprays into both nostrils daily.   11/09/2023 at  4:45 AM   montelukast  (SINGULAIR ) 10 MG tablet Take 1 tablet (10 mg total) by mouth at bedtime. 30 tablet 11 11/08/2023   Multiple Vitamin (MULTIVITAMIN ADULT PO) Take 1 tablet by mouth daily.   11/07/2023   pantoprazole  (PROTONIX ) 40 MG tablet TAKE 1 TABLET(40 MG) BY MOUTH DAILY 30 tablet 2 11/09/2023 at  4:45 AM   acetaminophen  (TYLENOL ) 500 MG tablet Take 1 tablet (500 mg total) by mouth every 6 (six) hours as needed (pain). 30 tablet 0 More than a month   Albuterol -Budesonide (AIRSUPRA ) 90-80 MCG/ACT AERO Inhale 2 puffs into the lungs every 4 (four) hours as needed. 10.7 g 11 More than a month   ibuprofen  (ADVIL ) 600 MG tablet Take 1 tablet (600 mg total) by mouth every 6 (six) hours as needed. 30 tablet 0 More than a month   METAMUCIL FIBER PO Take 1 capsule by mouth daily.   11/07/2023    Review of Systems  Constitutional: Negative.   Respiratory: Negative.    Cardiovascular: Negative.   Musculoskeletal: Negative.   Psychiatric/Behavioral: Negative.      Blood pressure 117/79, pulse 65, temperature 98.7 F (37.1 C), temperature source Oral, resp. rate 17, height 5' 4 (1.626 m), weight 59.9 kg, last menstrual period 06/20/2017, SpO2 98%. Physical Exam Constitutional:      Appearance: Normal appearance.  Cardiovascular:     Rate and Rhythm: Normal rate and regular rhythm.  Pulmonary:     Effort: Pulmonary effort is normal.     Breath sounds: Normal breath sounds.  Neurological:     General: No focal deficit present.     Mental Status: She is alert and oriented to person, place, and time.  Psychiatric:        Mood and Affect: Mood normal.     No results found for this or any previous visit (from the past 24  hours).  No results found.  Assessment/Plan: 58 yo G2P2 MWF with h/o right 3cm dermoid who is undergoing laparoscopic BSO today in addition to treatment for her SUI with Dr. Guadlupe.  Procedure reviewed.  Risks and benefits discussed.  Questions answered.  Pt ready to proceed.  Ronal GORMAN P[place] 11/09/2023, 6:38 AM

## 2023-11-09 NOTE — Op Note (Signed)
 11/09/2023  9:39 AM  PATIENT:  Kimberly Stevens  58 y.o. female  PRE-OPERATIVE DIAGNOSIS:  stress incontinence and Rt dermoid cyst Pelvic organ prolapse quantification stage 2 cystocele  POST-OPERATIVE DIAGNOSIS:  stress incontinence and Rt dermoid cystPelvic organ prolapse quantification stage 2 cystocele  PROCEDURE:  Procedure(s): SALPINGO-OOPHORECTOMY, LAPAROSCOPIC Second portion of procedure done by Dr. Guadlupe  SURGEON:  Ronal GORMAN Pinal  ASSISTANTS: Dr. Guadlupe.  An experienced assistant was required given the standard of surgical care given the complexity of the case.  This assistant was needed for exposure, dissection, suctioning, retraction, instrument exchange and for overall help during the procedure.  RNFA help was also unavailable.  ANESTHESIA:   general  ESTIMATED BLOOD LOSS: for my portion of procedure:  5cc  BLOOD ADMINISTERED:none   FLUIDS: 500cc LR for my portion of procedure  UOP: 100cc   SPECIMEN:  bilateral fallopian tubes and ovaries, pelvic washings  DISPOSITION OF SPECIMEN:  PATHOLOGY  FINDINGS: cystic lesion just beside right ovary c/w paratubal cyst, right ovary larger than left ovary.  Atrophic appearing left ovary.  Normal pelvic.  Liver scarring noted with survey of upper abdomen.    DESCRIPTION OF OPERATION: Patient is taken to the operating room. She is placed in the supine position. She is a running IV in place. Informed consent was present on the chart. SCDs on her lower extremities and functioning properly. Patient was positioned while she was awake.  Her legs were placed in the low lithotomy position in [place] stirrups. Her arms were tucked by the side.  General endotracheal anesthesia was administered by the anesthesia staff without difficulty. Dr. Boone, anesthesia, oversaw case.  Time out performed.    Clora prep was then used to prep the abdomen and Hibiclens was used to prep the inner thighs, perineum and vagina. Once 3 minutes had past the patient  was draped in a normal standard fashion. The legs were lifted to the high lithotomy position. The cervix was visualized by placing a heavy weighted speculum in the posterior aspect of the vagina and using a curved Deaver retractor to the retract anteriorly. The anterior lip of the cervix was grasped with single-tooth tenaculum.  The cervix sounded to 7 cm. Hulka clamp passed through cervical os and attached to the anterior lip of the cervix as a means of manipulating the uterus for the procedure. The tenaculum was removed. There is also good manipulation of the uterus. The speculum and retractor were removed as well. A Foley catheter was placed to straight drain.  Orange appearing urine was noted as pt had been taking pyridium .  Legs were lowered to the low lithotomy position and attention was turned the abdomen.  The umbilicus was everted.  Marcaine  0.25% used to anesthetize the skin.  Using #11 blade, 5mm skin incision was made.  A Veress needle was obtained. Syringe of sterile saline was placed on a open Veress needle.  With the abdomen elevated, the Veress needle was passed into the umbilicus until the pop was heard and then fluid started to drip.  Then low flow CO2 gas was attached the needle and the pneumoperitoneum was achieved without difficulty. Once four liters of gas was in the abdomen the Veress needle was removed and a 5 millimeter non-bladed Optiview trocar and port were passed directly to the abdomen. The laparoscope was then used to confirm intraperitoneal placement. Findings included scarring on edge of liver.  Right ovary larger than left ovary.  Paratubal cyst noted on the edge of  the fallopian tube just lateral to the right ovary.  Locations for RLQ and  LLQ ports were noted by transillumination of the abdominal wall.  0.25% marcaine  was used to anesthetize the skin.  5 mm skin incision was made in the RLA and a 10mm incision make in the LLQ.  5mm nonbladed trochar and port was placed in the  RLQ and larger 10mm port placed in the LLQ.  There were placed with direct visualization of the laparoscope.  All trochars were removed.    Pelvic washings were obtained.  Ureters were identifies.  Attention was turned to the left side. With uterus on stretch the left IP ligament was serially clamped, cauterized and incised with the ligasure device.  Then the mesosalpinx was serially clamped, cauterized and incised freeing the fallopian tube.  The uteroovarian pedicle and fallopian tube were clamped, cauterized and incised at the uterus edge.  Specimen was placed in the pelvis.  In a similar fashion the right fallopian tube were excised from the IP ligament, mesosalpinx and uterus.    #10 laparoscopic bag placed in the pelvis.  Specimens placed in the bag and removed without difficulty.  Pressure were then lowered to observe all pedicles.  No bleeding was noted.  At this point, this portion of the procedure was ended.  The LLQ incision was closed at the facial layer with #0 Vicryl.  The remaining instruments were removed.   The patient was taken out of Trendelenburg positioning.  Several deep breaths were given to the patient's trying to any gas the abdomen and finally the RLQ and umbilical ports were removed.    The skin was then closed with subcuticular stitches of 3-0 Vicryl. The skin was cleansed Dermabond was applied. Attention was then turned the vagina and the Hulka clamp was removed.  At this point, Dr. Guadlupe took over the procedure.  See her separate operative note.  At this point, the patient was very stable, draining clear urine from the foley catheter.  Sponge, lap, needle, instrument counts were correct x2 for the laparoscopic portion of the procedure. Patient tolerated the procedure very well.   COUNTS:  YES  PLAN OF CARE: Transfer to PACU

## 2023-11-09 NOTE — Telephone Encounter (Signed)
 Exam under anesthesia, anterior and posterior repair, midurethral sling, and cystoscopy on 11/09/23.  Passed void trial, voided , PVR 80mL Please call for postop check

## 2023-11-09 NOTE — Anesthesia Preprocedure Evaluation (Addendum)
 Anesthesia Evaluation  Patient identified by MRN, date of birth, ID band Patient awake    Reviewed: Allergy & Precautions, NPO status , Patient's Chart, lab work & pertinent test results  History of Anesthesia Complications (+) PROLONGED EMERGENCE and history of anesthetic complications  Airway Mallampati: II  TM Distance: >3 FB Neck ROM: Full    Dental no notable dental hx. (+) Teeth Intact   Pulmonary neg pulmonary ROS, neg sleep apnea, neg COPD, Patient abstained from smoking.Not current smoker   Pulmonary exam normal breath sounds clear to auscultation       Cardiovascular Exercise Tolerance: Good METS(-) hypertension(-) CAD and (-) Past MI negative cardio ROS (-) dysrhythmias  Rhythm:Regular Rate:Normal - Systolic murmurs    Neuro/Psych  PSYCHIATRIC DISORDERS  Depression    negative neurological ROS     GI/Hepatic ,neg GERD  ,,(+)     substance abuse  alcohol useDrinks wine 3-4 times a week   Endo/Other  neg diabetes    Renal/GU negative Renal ROS     Musculoskeletal   Abdominal   Peds  Hematology   Anesthesia Other Findings Past Medical History: No date: Allergy     Comment:  seasonal allergies No date: Arthritis     Comment:  left great toe joint No date: Broken arm     Comment:  right elbow & wrist No date: Colon polyps     Comment:  benign No date: Complication of anesthesia     Comment:  per patient she was hard to wake up from previous               surgery No date: Depression     Comment:  hx of post partum No date: Dermoid cyst of ovary No date: Eye disorder     Comment:  possible glaucoma-being followed by Dr Carmelia No date: Fibrocystic breast No date: IBS (irritable bowel syndrome) No date: Plantar fasciitis     Comment:  left foot 2/15-11/15 2014: Polyp of duodenum     Comment:  tiny No date: Tendonitis     Comment:  right knee 2/15-11/15 No date: Torn rotator cuff     Comment:   right  Reproductive/Obstetrics                              Anesthesia Physical Anesthesia Plan  ASA: 2  Anesthesia Plan: General   Post-op Pain Management: Tylenol  PO (pre-op)*, Gabapentin  PO (pre-op)* and Toradol IV (intra-op)*   Induction: Intravenous  PONV Risk Score and Plan: 4 or greater and Propofol  infusion, TIVA, Ondansetron , Dexamethasone and Midazolam   Airway Management Planned: Oral ETT  Additional Equipment: None  Intra-op Plan:   Post-operative Plan: Extubation in OR  Informed Consent: I have reviewed the patients History and Physical, chart, labs and discussed the procedure including the risks, benefits and alternatives for the proposed anesthesia with the patient or authorized representative who has indicated his/her understanding and acceptance.     Dental advisory given  Plan Discussed with: CRNA and Surgeon  Anesthesia Plan Comments: (Discussed risks of anesthesia with patient, including PONV, sore throat, lip/dental/eye damage. Rare risks discussed as well, such as cardiorespiratory and neurological sequelae, and allergic reactions. Discussed the role of CRNA in patient's perioperative care. Patient understands.)         Anesthesia Quick Evaluation

## 2023-11-09 NOTE — Telephone Encounter (Signed)
 TC from pt/spouse c/ o eye pain and can't open it.  Anesthesia knew about corneal abrasion prescribed in PACU eye drop and patient still having concerns.  Shared and discussed Dr Guadlupe she advised prescription erythromycin  ointment that she will send in.  Advised pt to call back if significantly and we will follow up tomorrow with post op call. Pt understood. Sotero CMA

## 2023-11-09 NOTE — Interval H&P Note (Signed)
 History and Physical Interval Note:  11/09/2023 7:23 AM  Kimberly Stevens  has presented today for surgery, with the diagnosis of stress incontinence and Rt dermoid cyst Pelvic organ prolapse quantification stage 2 cystocele.  The various methods of treatment have been discussed with the patient and family. After consideration of risks, benefits and other options for treatment, the patient has consented to  Procedure(s) with comments: SALPINGO-OOPHORECTOMY, LAPAROSCOPIC (Bilateral) - BSO; total time 1 hour combined 2 hours CREATION, URETHRAL SLING, RETROPUBIC APPROACH, USING POLYPROPYLENE TAPE (N/A) - total time 120 minutes ANTERIOR (CYSTOCELE) AND POSTERIOR REPAIR (RECTOCELE) (N/A) CYSTOSCOPY (N/A) PERINEOPLASTY (N/A) as a surgical intervention.  The patient's history has been reviewed, patient examined, no change in status, stable for surgery.  I have reviewed the patient's chart and labs.  Questions were answered to the patient's satisfaction.    Will send oxycodone  (#5) for pain control   Pasty Manninen T Guadlupe

## 2023-11-09 NOTE — Anesthesia Procedure Notes (Signed)
 Procedure Name: Intubation Date/Time: 11/09/2023 7:43 AM  Performed by: Bess Josette ORN, CRNAPre-anesthesia Checklist: Patient identified, Emergency Drugs available, Suction available, Patient being monitored and Timeout performed Patient Re-evaluated:Patient Re-evaluated prior to induction Oxygen Delivery Method: Circle system utilized Preoxygenation: Pre-oxygenation with 100% oxygen Induction Type: IV induction Ventilation: Mask ventilation without difficulty Laryngoscope Size: McGrath and 3 Grade View: Grade I Tube type: Oral Tube size: 6.5 mm Number of attempts: 1 Airway Equipment and Method: Stylet Placement Confirmation: ETT inserted through vocal cords under direct vision, positive ETCO2 and breath sounds checked- equal and bilateral Secured at: 22 cm Tube secured with: Tape Dental Injury: Teeth and Oropharynx as per pre-operative assessment

## 2023-11-09 NOTE — Op Note (Signed)
 Operative Note  Preoperative Diagnosis: Stage II pelvic organ prolapse, stress urinary incontinence, dermoid cyst  Postoperative Diagnosis: Stage II pelvic organ prolapse, stress urinary incontinence, right ovarian paratubal cyst  Procedures performed:  Exam under anesthesia, anterior and posterior repair, midurethral sling, and cystoscopy   Implants:  Implant Name Type Inv. Item Serial No. Manufacturer Lot No. LRB No. Used Action  VALORIE CAROLENE BLUSH [facility] - [id number] Sling SLING ADVANTAGE FIT [facility]  [employer] [id number] N/A 1 Implanted    Attending Surgeon: Lianne Leila Gillis, MD  Assistant Surgeon: Ronal Pinal, MD  Assistant: n/a  Anesthesia: General endotracheal  Findings: 1. On vaginal exam, stage II anterior vaginal wall prolapse present  2. On cystoscopy, normal bladder and urethral mucosa without injury or lesion. Brisk bilateral ureteral efflux present.    3. On laparoscopy, simple appearing 1-2cm paratubal cyst over right adnexa  Specimens: * No specimens in log *  Estimated blood loss: 50 mL  IV fluids: 1000 mL  Urine output: 400 mL  Complications: none  Procedure in Detail: After informed consent was obtained, the patient was taken to the operating room where she was placed under anesthesia.  She was then placed in the dorsal lithotomy position with Allen stirrups and prepped and draped in the usual sterile fashion.  Care was taken to avoid hyperflexion or hyperextension of her lower extremities.    Please refer to Dr. Dianne operative report for laparoscopic bilateral salpingo-oophorectomy.  For the anterior repair, two Allis clamps were placed along the midline of the anterior vaginal wall.  A lonestar self-retraining retractor was placed with 4 stay hooks. 1% lidocaine  with epinephrine  was injected into the vaginal mucosa.  A 15 blade scalpel was used to incise the vaginal mucosa in the midline. Allis clamps were placed along this incision and  Metzenbaum scissors were used to sharply dissect the epithelium off of the vesicovaginal septum bilaterally to the level of the pubic rami. Anterior plication of the vesicovaginal septum was then performed using 2-0 PDS. The vaginal mucosal edges were trimmed and the incision reapproximated with 2-0 Vicryl in a continuous running fashion. Hemostasis was noted. Hulka clamp site was noted to be hemostatic. The Foley catheter was removed. A 70-degree cystoscope was introduced, and 360-degree inspection revealed no trauma in the bladder, with bilateral ureteral efflux. The cystoscope was removed. Cystoscopy was repeated and brisk bilateral ureteral efflux was noted. The bladder was drained and the cystoscope was removed.  The Foley catheter was reinserted.  The mid urethral area was located on the anterior vaginal wall.  Two Allis clamps were placed at the level of the midurethra. 1% lidocaine  with epinephrine  was injected into the vaginal mucosa. A vertical incision was made between the two clamps using a 15-blade scalpel.  Using sharp dissection, Metzenbaum scissors were used to make a periurethral tunnel from the vaginal incision towards the pubic rami bilaterally for the future sling tracts. The bladder was ensured to be empty. The trocar and attached sling were introduced into the right side of the periurethral vaginal incision, just inferior to the pubic symphysis on the right side. The trocar was guided through the endopelvic fascia and directly vertically.  While hugging the cephalad surface of the pubic bone, the trocar was guided out through the abdomen 1 fingerbreadths lateral to midline at the level of the pubic symphysis on the ipsilateral side. The trocar was placed on the left side in a similar fashion.  A 70-degree cystoscope was introduced, and 360-degree inspection  revealed no trauma or trocars in the bladder, with brisk bilateral ureteral efflux.  The bladder was drained and the cystoscope was  removed.  The Foley catheter was reinserted.  The sling was brought to lie beneath the mid-urethra.  A Babcock clamp was placed over a loop of the sling. The plastic sheath was removed from the sling and the distal ends of the sling were trimmed just below the level of the skin incisions.  Tension-free positioning of the sling was confirmed with a right angle clamp placed behind the sling to ensure no tension. Vaginal inspection revealed no vaginotomy or sling perforations of the mucosa.  The vaginal mucosal edges were reapproximated using 2-0 Vicryl.  The vagina was copiously irrigated.  Hemostasis was again noted. The suprapubic sling incisions were closed with Dermabond.  Attention was then turned to the posterior vagina.  Two Allis clamps were placed at the introitus approximately 3cm from the urethra meatus. 1% lidocaine  with epinephrine  was injected into the vaginal mucosa in the posterior vaginal wall for hydrodissection and hemostasis. The rectovaginal septum was then dissected off the vaginal mucosa bilaterally. The rectovaginal septum was then plicated in a continuous running fashion with 2-0 PDS while one finger was placed in the rectum to prevent rectal penetration.  After placement of the first plication stitch two fingers were inserted into the vaginal to confirm adequate caliber.  The suture incorporated the perineal body in a U stitch fashion and the bulbocavernosus muscles. A 2-0 Vicryl was used in a subcuticular fashion to re-approximate the hymenal ring. After plication, the vaginal mucosa was reapproximated using 2-0 Vicryl suture in a continuous fashion.  The vagina was irrigated.  Hemostasis was noted. A rectal examination was normal and confirmed no sutures within the rectum. Three fingers passed through the vaginal opening without difficulty.  The patient tolerated the procedure well.  She was awakened from anesthesia and transferred to the recovery room in stable condition. All counts  were correct x 2.

## 2023-11-10 ENCOUNTER — Encounter (HOSPITAL_COMMUNITY): Payer: Self-pay | Admitting: Obstetrics & Gynecology

## 2023-11-10 ENCOUNTER — Ambulatory Visit (HOSPITAL_BASED_OUTPATIENT_CLINIC_OR_DEPARTMENT_OTHER): Payer: Self-pay | Admitting: Obstetrics & Gynecology

## 2023-11-10 ENCOUNTER — Other Ambulatory Visit (HOSPITAL_COMMUNITY): Payer: Self-pay

## 2023-11-10 LAB — SURGICAL PATHOLOGY

## 2023-11-10 NOTE — Telephone Encounter (Signed)
 Kimberly Stevens  underwent Salpingo-oophorectomy, Laparoscopic - Bilateral, Creation, Urethral Sling, Retropubic Approach, Using Polypropylene Tape, Anterior (cystocele) And Posterior Repair (rectocele), Cystoscopy, and Perineoplasty  on 11/09/2023  with [] Dr Marilynne [x] Dr Guadlupe.  The patient reports that her pain is controlled.  She is taking [] No Medication [x] Acetaminophen  500mg  every 6 hours [x] Ibuprofen  600mg  every 6 hours or []  Prescribed Narcotic. Her pain level is 2[] with medication [] Without medication is.   She minimal vaginal bleeding.  The patient is tolerating PO fluids and solids. She has had a bowel movement and is taking Miralax for a bowel regimen. She is passing gas.  She was discharged without a catheter.   [x]  Discharged without a catheter, the patient does feel as if she is emptying her bladder.  She does not having any additional questions.  Reviewed Post operative instructions as needed to answer additional questions.   CC'd note to patient's provider.

## 2023-11-11 ENCOUNTER — Telehealth: Payer: Self-pay | Admitting: *Deleted

## 2023-11-11 NOTE — Telephone Encounter (Signed)
 TC to pt to followup on her eye to ensure that it is better.  Pt states she is so much better.  She appreciated the call. Sotero CMA

## 2023-11-12 LAB — CYTOLOGY - NON PAP

## 2023-11-27 ENCOUNTER — Other Ambulatory Visit: Payer: Self-pay | Admitting: Internal Medicine

## 2023-12-07 ENCOUNTER — Encounter (HOSPITAL_BASED_OUTPATIENT_CLINIC_OR_DEPARTMENT_OTHER): Payer: Self-pay | Admitting: Obstetrics & Gynecology

## 2023-12-08 ENCOUNTER — Encounter (HOSPITAL_BASED_OUTPATIENT_CLINIC_OR_DEPARTMENT_OTHER): Payer: Self-pay | Admitting: Obstetrics & Gynecology

## 2023-12-08 ENCOUNTER — Other Ambulatory Visit (HOSPITAL_COMMUNITY)
Admission: RE | Admit: 2023-12-08 | Discharge: 2023-12-08 | Disposition: A | Discharge status: Home / Self Care | Source: Ambulatory Visit | Attending: Obstetrics & Gynecology | Admitting: Obstetrics & Gynecology

## 2023-12-08 ENCOUNTER — Ambulatory Visit (HOSPITAL_BASED_OUTPATIENT_CLINIC_OR_DEPARTMENT_OTHER): Payer: Self-pay | Admitting: Obstetrics & Gynecology

## 2023-12-08 VITALS — BP 111/77 | HR 71 | Ht 64.0 in | Wt 135.0 lb

## 2023-12-08 DIAGNOSIS — Z90722 Acquired absence of ovaries, bilateral: Secondary | ICD-10-CM

## 2023-12-08 DIAGNOSIS — N898 Other specified noninflammatory disorders of vagina: Secondary | ICD-10-CM | POA: Insufficient documentation

## 2023-12-08 DIAGNOSIS — L292 Pruritus vulvae: Secondary | ICD-10-CM | POA: Diagnosis not present

## 2023-12-08 DIAGNOSIS — Z23 Encounter for immunization: Secondary | ICD-10-CM

## 2023-12-08 MED ORDER — TRIAMCINOLONE ACETONIDE 0.5 % EX OINT: 1.0000 | TOPICAL_OINTMENT | Freq: Two times a day (BID) | CUTANEOUS | 0 refills | Status: AC

## 2023-12-08 NOTE — Progress Notes (Unsigned)
 GYNECOLOGY  VISIT  CC:   post op recheck  HPI: 58 y.o. G2P2002 Married White or Caucasian female here for recheck after undergoing Laparoscopic Salpingo-oopherectomy on 11/09/2023.  She reports bleeding is none.  She has dull pain on the left side.  It is improving but still aches at times.  Bowel function is abnormal. Reports intermittent constipation.  Bladder function is normal.    Reports she is having some discharge which she thinks is likely related to the surgery done for incontinence.  Is not have any leaking but is having to really think about fully emptying her bladder when she voids.  Does have follow up with Dr. Guadlupe scheduled.    Has been wearing a pad and is having external irritation.  Was advised to use some topical estrogen cream but had a reaction to it when using it vaginally so wonders if this is the cause or if she's having issues with the pad she is wearing which is fragrance free.  Lastly, she wonders if this is yeast.  Has some diflucan  at home but wasn't sure if she should use it.  Lastly, would like a flu shot today.  Pathology reviewed:  Yes .  Questions answered.    MEDS:   Current Outpatient Medications on File Prior to Visit  Medication Sig Dispense Refill   estradiol  (ESTRACE ) 0.1 MG/GM vaginal cream Place 0.5 g vaginally 2 (two) times a week. Place 0.5g nightly for two weeks then twice a week after 30 g 3   famotidine (PEPCID) 10 MG tablet Take 10 mg by mouth at bedtime.     fluticasone  (FLONASE ) 50 MCG/ACT nasal spray Place 2 sprays into both nostrils daily.     montelukast  (SINGULAIR ) 10 MG tablet Take 1 tablet (10 mg total) by mouth at bedtime. 30 tablet 11   pantoprazole  (PROTONIX ) 40 MG tablet TAKE 1 TABLET(40 MG) BY MOUTH DAILY 30 tablet 2   acetaminophen  (TYLENOL ) 500 MG tablet Take 1 tablet (500 mg total) by mouth every 6 (six) hours as needed (pain). (Patient not taking: Reported on 12/08/2023) 30 tablet 0   Albuterol -Budesonide (AIRSUPRA ) 90-80  MCG/ACT AERO Inhale 2 puffs into the lungs every 4 (four) hours as needed. (Patient not taking: Reported on 12/08/2023) 10.7 g 11   benzonatate  (TESSALON ) 100 MG capsule Take 2 capsules (200 mg total) by mouth every 12 (twelve) hours as needed for cough. (Patient not taking: Reported on 12/08/2023) 120 capsule 0   chlorpheniramine (CHLOR-TRIMETON) 4 MG tablet Take 4 mg by mouth every 6 (six) hours as needed for allergies. (Patient not taking: Reported on 12/08/2023)     cholecalciferol (VITAMIN D3) 25 MCG (1000 UNIT) tablet Take 1,000 Units by mouth daily. (Patient not taking: Reported on 12/08/2023)     erythromycin  ophthalmic ointment Place 1 Application into the left eye at bedtime. (Patient not taking: Reported on 12/08/2023) 3.5 g 0   ibuprofen  (ADVIL ) 600 MG tablet Take 1 tablet (600 mg total) by mouth every 6 (six) hours as needed. (Patient not taking: Reported on 12/08/2023) 30 tablet 0   METAMUCIL FIBER PO Take 1 capsule by mouth daily. (Patient not taking: Reported on 12/08/2023)     Multiple Vitamin (MULTIVITAMIN ADULT PO) Take 1 tablet by mouth daily. (Patient not taking: Reported on 12/08/2023)     nitrofurantoin , macrocrystal-monohydrate, (MACROBID ) 100 MG capsule Take 1 capsule (100 mg total) by mouth 2 (two) times daily. Complete five full days of treatment (Patient not taking: Reported on 12/08/2023)  oxyCODONE  (OXY IR/ROXICODONE ) 5 MG immediate release tablet Take 1 tablet (5 mg total) by mouth every 4 (four) hours as needed for severe pain (pain score 7-10). (Patient not taking: Reported on 12/08/2023) 5 tablet 0   No current facility-administered medications on file prior to visit.    SH:  Smoking No    PHYSICAL EXAMINATION:    BP 111/77 (BP Location: Right Arm, Patient Position: Sitting, Cuff Size: Normal)   Pulse 71   Ht 5' 4 (1.626 m)   Wt 135 lb (61.2 kg)   LMP 06/20/2017 (Approximate)   SpO2 97%   BMI 23.17 kg/m     General appearance: alert, cooperative and  appears stated age Abdomen: soft, non-tender; bowel sounds normal; no masses,  no organomegaly Incisions:  C/D/I  Pelvic: External genitalia:  external vulvar erythema and a little swelling on the right inferior portion of right labia majora              Urethra:  normal appearing urethra with no masses, tenderness or lesions              Bartholins and Skenes: normal                 Vagina: normal appearing vagina with normal color and discharge, no lesions, anterior incision well approximated without erythema or blood, sutures intact               Chaperone was present for exam.  Assessment/Plan: 1. S/P BSO (bilateral salpingo-oophorectomy) (Primary) - doing well from surgery.  Advised larger incision was on side where she has more discomfort.  Expect this to fully resolve  2. Vaginal discharge - advised to take diflucan  she has at home now and await vaginal/vulvar swab testing for yeast and BV - Cervicovaginal ancillary only  3. Influenza vaccination administered at current visit - Flu vaccine trivalent PF, 6mos and older(Flulaval,Afluria,Fluarix,Fluzone)  4. Vulvar itching - pt aware I think this could be related to sensitivity/allergy to something in the estradiol  cream.  Recommended stop it for now.   - rx for topical triamcinolone  ointment to be used BID to pharmacy.

## 2023-12-09 LAB — CERVICOVAGINAL ANCILLARY ONLY
Bacterial Vaginitis (gardnerella): NEGATIVE
Candida Glabrata: NEGATIVE
Candida Vaginitis: NEGATIVE
Comment: NEGATIVE
Comment: NEGATIVE
Comment: NEGATIVE

## 2023-12-10 ENCOUNTER — Encounter: Admitting: Obstetrics

## 2023-12-10 ENCOUNTER — Encounter: Payer: Self-pay | Admitting: Obstetrics

## 2023-12-10 ENCOUNTER — Ambulatory Visit (HOSPITAL_BASED_OUTPATIENT_CLINIC_OR_DEPARTMENT_OTHER): Payer: Self-pay | Admitting: Obstetrics & Gynecology

## 2023-12-21 ENCOUNTER — Ambulatory Visit: Admitting: Obstetrics

## 2023-12-21 ENCOUNTER — Encounter: Payer: Self-pay | Admitting: Obstetrics

## 2023-12-21 VITALS — BP 124/77 | HR 71

## 2023-12-21 DIAGNOSIS — Z48816 Encounter for surgical aftercare following surgery on the genitourinary system: Secondary | ICD-10-CM

## 2023-12-21 DIAGNOSIS — N952 Postmenopausal atrophic vaginitis: Secondary | ICD-10-CM

## 2023-12-21 NOTE — Progress Notes (Addendum)
 Kimberly Stevens  Date of Visit: 12/21/2023  History of Present Illness: Kimberly Stevens is a 58 y.o. female scheduled today for a post-operative visit.   Surgery: s/p Exam under anesthesia, anterior and posterior repair, midurethral sling, and cystoscopy  on 11/09/23 Laparoscopic BSO by Dr. Cleotilde  She passed her postoperative void trial POD#0.   Postoperative course was complicated by corneal abrasion treated with erythromycin  ointment.   Today she reports doing well without pain 12/08/23 postop visit with Dr. Cleotilde, reported vaginal discharge and irritation, treated with triamcinolone  x 2 doses with resolution of symptoms. Sutures intact on vaginal exam. Denies pad use since. Denies discharge or irritation  UTI in the last 6 weeks? No  Pain? No  She has not returned to her normal activity (except for postop restrictions), has not returned to [facility].  Vaginal bulge? No  Stress incontinence: No  Urgency/frequency: No  Day time voids 5.  Nocturia: 0-1x/night down when she wakes up in the middle of the night from 1-2 times per night  Urge incontinence: No  Voiding dysfunction: No , reports increased void times and has to focus on pelvic relaxation. Improved since surgery. Bowel issues: No , BM 1x/day with metamucil 1 pill/day  Subjective Success: Do you usually have a bulge or something falling out that you can see or feel in the vaginal area? No  Retreatment Success: Any retreatment with surgery or pessary for any compartment? No   Pathology results:  A. FALLOPIAN TUBES AND OVARIES, BILATERAL, SALPINGO-OOPHORECTOMY:  - Bilateral fallopian tubes: Paratubal cysts  - Benign serous cystadenoma, ovary  - Unremarkable ovary   Medications: She has a current medication list which includes the following prescription(s): cholecalciferol, estradiol , famotidine, fluticasone , fiber, montelukast , multiple vitamin, pantoprazole , and triamcinolone  ointment.   Allergies: Patient is  allergic to estradiol , gluten meal, and lactose intolerance (gi).   Physical Exam: BP 124/77   Pulse 71   LMP 06/20/2017 (Approximate)   Abdomen: soft, non-tender, without masses or organomegaly Abdominal and suprapubic Incisions: healing well, no significant drainage, no dehiscence, no significant erythema.  Pelvic Examination: Vagina: Incisions healing well, no significant drainage, no dehiscence, no significant erythema. Sutures are present at incision line and there is not granulation tissue. No tenderness along the anterior or posterior vagina. No apical tenderness. No pelvic masses. No visible or palpable mesh. Discomfort with palpation of left sided pelvic floor muscles  POP-Q: POP-Q  -3                                            Aa   -3                                           Ba  -7                                              C   2                                            Gh  3                                            Pb  8                                            tvl   -3                                            Ap  -3                                            Bp  -8                                              D    Post Void Residual - 12/21/23 1126       Post Void Residual   Post Void Residual 8 mL          ---------------------------------------------------------  Assessment and Plan:  1. Vaginal atrophy    - PVR WNL, encouraged pelvic floor relaxation exercises and diaphragmatic breathing. Consider pelvic floor PT if persistent at follow-up. Encouraged double voiding. - Pathology results were reviewed with the patient today and she verbalized understanding that the results were benign.  - Can resume regular activity including exercise and intercourse,  if desired.  - Discussed avoidance of heavy lifting and straining long term to reduce the risk of recurrence.   All questions answered.   Return in about 6 weeks (around 02/01/2024) for  postop check.

## 2023-12-21 NOTE — Patient Instructions (Signed)
 Please call if you experience any change in urinary or vaginal symptoms.

## 2023-12-21 NOTE — Assessment & Plan Note (Signed)
-   uses lubrication during intercourse prior to surgery - For symptomatic vaginal atrophy options include lubrication with a water-based lubricant, personal hygiene measures and barrier protection against wetness, and estrogen replacement in the form of vaginal cream, vaginal tablets, or a time-released vaginal ring.   - can resume low dose vaginal estrogen 1g 2x/week, will avoid loading dose due to prior vaginal irritation with nightly dose

## 2024-01-10 ENCOUNTER — Ambulatory Visit: Payer: Self-pay

## 2024-01-10 NOTE — Telephone Encounter (Signed)
 With Emergency Department disposition declined- 4 pm slot on Tuesday?

## 2024-01-10 NOTE — Telephone Encounter (Signed)
 Please see triage notes and advise. Patient refused ED.

## 2024-01-10 NOTE — Telephone Encounter (Signed)
 FYI Only or Action Required?: FYI only for provider: ED advised and patient declines.  Patient was last seen in primary care on 10/29/2023 by Jesus Bernardino MATSU, MD.  Called Nurse Triage reporting Chest Pain.  Symptoms began several days ago.  Interventions attempted: Nothing.  Symptoms are: unchanged.  Triage Disposition: Go to ED Now (Notify PCP)  Patient/caregiver understands and will follow disposition?: No, wishes to speak with PCP   Copied from CRM #[id number]. Topic: Clinical - Red Word Triage >> Jan 10, 2024 12:48 PM Alexandria E wrote: [facility] that prompted transfer to Nurse Triage: Chest pain, patient stated she cannot get a good breath in. Patient also has a cough. Reason for Disposition  Difficulty breathing  Answer Assessment - Initial Assessment Questions Advised ED now. Patient declines and reports I will way it out. Advised 911 if symptoms worsen; pt verbalized understanding.  1. LOCATION: Where does it hurt?       Upper chest pain, upper back pain,  Intermittent neck and back     Coughing; yellow-dark phlegm 3. ONSET: When did the chest pain begin? (Minutes, hours or days)      Saturday;  4. PATTERN: Does the pain come and go, or has it been constant since it started?  Does it get worse with exertion?      constant 5. DURATION: How long does it last (e.g., seconds, minutes, hours)     Since this morning 6. SEVERITY: How bad is the pain?  (e.g., Scale 1-10; mild, moderate, or severe) 2/10 chest pain, constant with sob with rest/ exertion, not able to efficient amount breath  8. PULMONARY RISK FACTORS: Do you have any history of lung disease?  (e.g., blood clots in lung, asthma, emphysema, birth control pills)     asthma 9. CAUSE: What do you think is causing the chest pain?     congestion 10. OTHER SYMPTOMS: Do you have any other symptoms? (e.g., dizziness, nausea, vomiting, sweating, fever, difficulty breathing, cough) HA, shoulder neck;  both sides; comes and goes movement   Denies blurred vision, dizziness,  fever,chill, n/v/d can't check temp, heartburn, sweating, sore throat  no pain when deep breath  Protocols used: Chest Pain-A-AH

## 2024-01-11 ENCOUNTER — Ambulatory Visit: Admitting: Family Medicine

## 2024-01-31 ENCOUNTER — Encounter: Payer: Self-pay | Admitting: *Deleted

## 2024-02-02 NOTE — Progress Notes (Signed)
 [facility]  Date of Visit: 02/03/2024  History of Present Illness: Ms. Bellotti is a 59 y.o. female scheduled today for a 3 months post-operative visit.   Surgery: s/p Exam under anesthesia, anterior and posterior repair, midurethral sling, and cystoscopy  on 11/09/23 Laparoscopic BSO by Dr. Cleotilde  She passed her postoperative void trial POD#0.   Postoperative course was complicated by corneal abrasion treated with erythromycin  ointment.   Today she reports doing well without pain Double voiding due to sensation of incomplete emptying Denies using vaginal estrogen Resumed intercourse without discomfort and returned to pickle ball Pending to swim the [place] this year 12/08/23 postop visit with Dr. Cleotilde, reported vaginal discharge and irritation, treated with triamcinolone  x 2 doses with resolution of symptoms. Sutures intact on vaginal exam. Denies pad use since. Denies discharge or irritation  UTI in the last 6 weeks? No  Pain? No  She has not returned to her normal activity (except for postop restrictions), has not returned to [facility].  Vaginal bulge? No  Stress incontinence: No  Urgency/frequency: No  Day time voids 5.  Nocturia: 0-1x/night down when she wakes up in the middle of the night from 1-2 times per night  Urge incontinence: No  Voiding dysfunction: No , reports increased void times and has to focus on pelvic relaxation. Improved since surgery. Bowel issues: No , BM 1x/day with metamucil 1 pill/day  Subjective Success: Do you usually have a bulge or something falling out that you can see or feel in the vaginal area? No  Retreatment Success: Any retreatment with surgery or pessary for any compartment? No   Pathology results:  A. FALLOPIAN TUBES AND OVARIES, BILATERAL, SALPINGO-OOPHORECTOMY:  - Bilateral fallopian tubes: Paratubal cysts  - Benign serous cystadenoma, ovary  - Unremarkable ovary   Medications: She has a current medication list which  includes the following prescription(s): cholecalciferol, estradiol , famotidine, fluticasone , fiber, montelukast , multiple vitamin, pantoprazole , and triamcinolone  ointment.   Allergies: Patient is allergic to estradiol , gluten meal, and lactose intolerance (gi).   Physical Exam: BP 105/76   Pulse 80   LMP 06/20/2017   Abdomen: soft, non-tender, without masses or organomegaly Abdominal and suprapubic Incisions: healing well, no significant drainage, no dehiscence, no significant erythema.  Pelvic Examination: Vagina: Incisions healing well, no significant drainage, no dehiscence, no significant erythema. Sutures are present at incision line and there is not granulation tissue. No tenderness along the anterior or posterior vagina. No apical tenderness. No pelvic masses. No visible or palpable mesh.   POP-Q: POP-Q  -3                                            Aa   -3                                           Ba  8                                              C   1  Gh  3                                            Pb  -8                                            tvl   -3                                            Ap  -3                                            Bp  -8                                              D    Straight Catheterization Procedure for PVR: After verbal consent was obtained from the patient for catheterization to assess bladder emptying and residual volume the urethra and surrounding tissues were prepped with betadine  and an in and out catheterization was performed.  PVR was 50mL.  Urine appeared clear yellow. The patient tolerated the procedure well.   ---------------------------------------------------------  Assessment and Plan:  1. Feeling of incomplete bladder emptying    Feeling of incomplete bladder emptying Assessment & Plan: - reports double voiding to ensure complete bladder emptying - catheterized  for 50mL - encouraged pelvic floor relaxation exercises due to high tone pelvic floor on exam - no mesh complications noted on exam  - consider urodynamics if clinical change   - PVR WNL, encouraged pelvic floor relaxation exercises and diaphragmatic breathing. Consider pelvic floor PT if persistent at follow-up. Encouraged double voiding. - Pathology were benign.  - continue regular activity including exercise and intercourse - Discussed avoidance of heavy lifting and straining long term to reduce the risk of recurrence.   All questions answered.   No follow-ups on file.

## 2024-02-03 ENCOUNTER — Ambulatory Visit: Admitting: Obstetrics

## 2024-02-03 ENCOUNTER — Encounter: Payer: Self-pay | Admitting: Obstetrics

## 2024-02-03 VITALS — BP 105/76 | HR 80

## 2024-02-03 DIAGNOSIS — R3914 Feeling of incomplete bladder emptying: Secondary | ICD-10-CM | POA: Insufficient documentation

## 2024-02-03 DIAGNOSIS — Z48816 Encounter for surgical aftercare following surgery on the genitourinary system: Secondary | ICD-10-CM

## 2024-02-03 NOTE — Patient Instructions (Signed)
 Please return if you experience any change in urinary or vaginal symptoms.   Resume pelvic floor relaxation and deep breathing during voids.   Consider pelvic floor PT and return for reassess if symptoms persist.

## 2024-02-03 NOTE — Assessment & Plan Note (Signed)
-   reports double voiding to ensure complete bladder emptying - catheterized for 50mL - encouraged pelvic floor relaxation exercises due to high tone pelvic floor on exam - no mesh complications noted on exam  - consider urodynamics if clinical change

## 2024-03-16 ENCOUNTER — Ambulatory Visit (INDEPENDENT_AMBULATORY_CARE_PROVIDER_SITE_OTHER): Admitting: Otolaryngology

## 2024-03-23 ENCOUNTER — Ambulatory Visit (INDEPENDENT_AMBULATORY_CARE_PROVIDER_SITE_OTHER): Admitting: Otolaryngology

## 2024-04-26 ENCOUNTER — Encounter: Admitting: Family Medicine

## 2024-06-15 ENCOUNTER — Ambulatory Visit (HOSPITAL_BASED_OUTPATIENT_CLINIC_OR_DEPARTMENT_OTHER): Admitting: Obstetrics & Gynecology
